# Patient Record
Sex: Female | Born: 1950
Health system: Southern US, Community
[De-identification: ages and names within clinical notes are randomized; demographics above are authoritative.]

## PROBLEM LIST (undated history)

## (undated) DIAGNOSIS — R112 Nausea with vomiting, unspecified: Secondary | ICD-10-CM

## (undated) DIAGNOSIS — F419 Anxiety disorder, unspecified: Secondary | ICD-10-CM

## (undated) DIAGNOSIS — I10 Essential (primary) hypertension: Secondary | ICD-10-CM

## (undated) DIAGNOSIS — E039 Hypothyroidism, unspecified: Secondary | ICD-10-CM

## (undated) DIAGNOSIS — F32A Depression, unspecified: Secondary | ICD-10-CM

## (undated) DIAGNOSIS — Z9889 Other specified postprocedural states: Secondary | ICD-10-CM

## (undated) DIAGNOSIS — F329 Major depressive disorder, single episode, unspecified: Secondary | ICD-10-CM

## (undated) HISTORY — DX: Essential (primary) hypertension: I10

## (undated) HISTORY — PX: TONSILLECTOMY: SUR1361

## (undated) HISTORY — DX: Depression, unspecified: F32.A

## (undated) HISTORY — PX: TUBAL LIGATION: SHX77

## (undated) HISTORY — DX: Anxiety disorder, unspecified: F41.9

## (undated) HISTORY — DX: Hypothyroidism, unspecified: E03.9

## (undated) HISTORY — DX: Major depressive disorder, single episode, unspecified: F32.9

---

## 1997-12-19 HISTORY — PX: BUNIONECTOMY: SHX129

## 2000-09-05 ENCOUNTER — Other Ambulatory Visit: Admission: RE | Admit: 2000-09-05 | Discharge: 2000-09-05 | Payer: Self-pay | Admitting: Gynecology

## 2000-09-28 ENCOUNTER — Emergency Department (HOSPITAL_COMMUNITY): Admission: EM | Admit: 2000-09-28 | Discharge: 2000-09-29 | Payer: Self-pay | Admitting: *Deleted

## 2000-10-20 ENCOUNTER — Encounter: Payer: Self-pay | Admitting: Family Medicine

## 2000-10-20 ENCOUNTER — Ambulatory Visit (HOSPITAL_COMMUNITY): Admission: RE | Admit: 2000-10-20 | Discharge: 2000-10-20 | Payer: Self-pay | Admitting: Family Medicine

## 2002-01-08 ENCOUNTER — Encounter: Admission: RE | Admit: 2002-01-08 | Discharge: 2002-01-08 | Payer: Self-pay | Admitting: Otolaryngology

## 2002-01-08 ENCOUNTER — Encounter: Payer: Self-pay | Admitting: Otolaryngology

## 2003-01-27 ENCOUNTER — Other Ambulatory Visit: Admission: RE | Admit: 2003-01-27 | Discharge: 2003-01-27 | Payer: Self-pay | Admitting: Obstetrics and Gynecology

## 2004-02-02 ENCOUNTER — Other Ambulatory Visit: Admission: RE | Admit: 2004-02-02 | Discharge: 2004-02-02 | Payer: Self-pay | Admitting: Gynecology

## 2004-11-08 ENCOUNTER — Ambulatory Visit: Payer: Self-pay | Admitting: Family Medicine

## 2005-02-03 ENCOUNTER — Other Ambulatory Visit: Admission: RE | Admit: 2005-02-03 | Discharge: 2005-02-03 | Payer: Self-pay | Admitting: Gynecology

## 2005-04-06 ENCOUNTER — Ambulatory Visit: Payer: Self-pay | Admitting: Internal Medicine

## 2005-07-20 ENCOUNTER — Ambulatory Visit: Payer: Self-pay | Admitting: Internal Medicine

## 2005-09-16 ENCOUNTER — Ambulatory Visit: Payer: Self-pay | Admitting: Internal Medicine

## 2005-10-05 ENCOUNTER — Ambulatory Visit: Payer: Self-pay | Admitting: Internal Medicine

## 2006-05-09 ENCOUNTER — Ambulatory Visit: Payer: Self-pay | Admitting: Internal Medicine

## 2006-05-12 ENCOUNTER — Ambulatory Visit: Payer: Self-pay | Admitting: Internal Medicine

## 2006-12-05 ENCOUNTER — Ambulatory Visit: Payer: Self-pay | Admitting: Internal Medicine

## 2007-01-30 ENCOUNTER — Ambulatory Visit: Payer: Self-pay | Admitting: Internal Medicine

## 2007-01-30 LAB — CONVERTED CEMR LAB
BUN: 12 mg/dL (ref 6–23)
CO2: 33 meq/L — ABNORMAL HIGH (ref 19–32)
Calcium: 9.8 mg/dL (ref 8.4–10.5)
Chloride: 103 meq/L (ref 96–112)
Creatinine, Ser: 0.8 mg/dL (ref 0.4–1.2)
GFR calc Af Amer: 96 mL/min
GFR calc non Af Amer: 79 mL/min
Glucose, Bld: 75 mg/dL (ref 70–99)
Potassium: 3.9 meq/L (ref 3.5–5.1)
Sodium: 144 meq/L (ref 135–145)
TSH: 2.26 microintl units/mL (ref 0.35–5.50)

## 2007-08-03 ENCOUNTER — Telehealth (INDEPENDENT_AMBULATORY_CARE_PROVIDER_SITE_OTHER): Payer: Self-pay | Admitting: *Deleted

## 2007-08-10 ENCOUNTER — Ambulatory Visit: Payer: Self-pay | Admitting: Internal Medicine

## 2007-08-10 DIAGNOSIS — E039 Hypothyroidism, unspecified: Secondary | ICD-10-CM | POA: Insufficient documentation

## 2007-08-10 DIAGNOSIS — Z87442 Personal history of urinary calculi: Secondary | ICD-10-CM | POA: Insufficient documentation

## 2007-08-10 DIAGNOSIS — G43009 Migraine without aura, not intractable, without status migrainosus: Secondary | ICD-10-CM | POA: Insufficient documentation

## 2007-08-10 DIAGNOSIS — F329 Major depressive disorder, single episode, unspecified: Secondary | ICD-10-CM | POA: Insufficient documentation

## 2007-08-10 DIAGNOSIS — I10 Essential (primary) hypertension: Secondary | ICD-10-CM | POA: Insufficient documentation

## 2007-08-10 DIAGNOSIS — J309 Allergic rhinitis, unspecified: Secondary | ICD-10-CM | POA: Insufficient documentation

## 2007-08-10 DIAGNOSIS — Z9189 Other specified personal risk factors, not elsewhere classified: Secondary | ICD-10-CM | POA: Insufficient documentation

## 2007-08-10 LAB — CONVERTED CEMR LAB
Bilirubin Urine: NEGATIVE
Glucose, Urine, Semiquant: NEGATIVE
Ketones, urine, test strip: NEGATIVE
Nitrite: NEGATIVE
Protein, U semiquant: NEGATIVE
Specific Gravity, Urine: 1.015
Urobilinogen, UA: NEGATIVE
WBC Urine, dipstick: NEGATIVE
pH: 6

## 2007-08-11 ENCOUNTER — Encounter: Payer: Self-pay | Admitting: Internal Medicine

## 2007-08-11 LAB — CONVERTED CEMR LAB
Bacteria, UA: NONE SEEN
RBC / HPF: NONE SEEN (ref <3)
WBC, UA: NONE SEEN cells/hpf (ref <3)

## 2007-08-14 LAB — CONVERTED CEMR LAB
BUN: 16 mg/dL (ref 6–23)
Basophils Absolute: 0.1 10*3/uL (ref 0.0–0.1)
Basophils Relative: 2 % — ABNORMAL HIGH (ref 0–1)
CO2: 27 meq/L (ref 19–32)
Calcium: 9.8 mg/dL (ref 8.4–10.5)
Chloride: 103 meq/L (ref 96–112)
Creatinine, Ser: 0.84 mg/dL (ref 0.40–1.20)
Eosinophils Absolute: 0.3 10*3/uL (ref 0.0–0.7)
Eosinophils Relative: 6 % — ABNORMAL HIGH (ref 0–5)
Glucose, Bld: 72 mg/dL (ref 70–99)
HCT: 40.6 % (ref 36.0–46.0)
Hemoglobin: 13.7 g/dL (ref 12.0–15.0)
Lymphocytes Relative: 43 % (ref 12–46)
Lymphs Abs: 1.9 10*3/uL (ref 0.7–3.3)
MCHC: 33.7 g/dL (ref 30.0–36.0)
MCV: 94 fL (ref 78.0–100.0)
Monocytes Absolute: 0.4 10*3/uL (ref 0.2–0.7)
Monocytes Relative: 10 % (ref 3–11)
Neutro Abs: 1.8 10*3/uL (ref 1.7–7.7)
Neutrophils Relative %: 40 % — ABNORMAL LOW (ref 43–77)
Platelets: 244 10*3/uL (ref 150–400)
Potassium: 4 meq/L (ref 3.5–5.3)
RBC: 4.32 M/uL (ref 3.87–5.11)
RDW: 13.3 % (ref 11.5–14.0)
Sodium: 142 meq/L (ref 135–145)
TSH: 2.879 microintl units/mL (ref 0.350–5.50)
WBC: 4.5 10*3/uL (ref 4.0–10.5)

## 2007-09-23 ENCOUNTER — Emergency Department (HOSPITAL_COMMUNITY): Admission: EM | Admit: 2007-09-23 | Discharge: 2007-09-24 | Payer: Self-pay | Admitting: Emergency Medicine

## 2007-09-28 ENCOUNTER — Encounter: Payer: Self-pay | Admitting: Family Medicine

## 2007-09-28 ENCOUNTER — Telehealth (INDEPENDENT_AMBULATORY_CARE_PROVIDER_SITE_OTHER): Payer: Self-pay | Admitting: *Deleted

## 2007-09-28 ENCOUNTER — Observation Stay (HOSPITAL_COMMUNITY): Admission: EM | Admit: 2007-09-28 | Discharge: 2007-09-30 | Payer: Self-pay | Admitting: Emergency Medicine

## 2007-09-28 ENCOUNTER — Ambulatory Visit: Payer: Self-pay | Admitting: Internal Medicine

## 2007-10-04 ENCOUNTER — Ambulatory Visit: Payer: Self-pay | Admitting: Internal Medicine

## 2007-10-05 LAB — CONVERTED CEMR LAB
Amylase: 221 units/L — ABNORMAL HIGH (ref 27–131)
BUN: 11 mg/dL (ref 6–23)
Basophils Absolute: 0 10*3/uL (ref 0.0–0.1)
Basophils Relative: 0.2 % (ref 0.0–1.0)
CO2: 30 meq/L (ref 19–32)
Calcium: 8.7 mg/dL (ref 8.4–10.5)
Chloride: 102 meq/L (ref 96–112)
Creatinine, Ser: 0.9 mg/dL (ref 0.4–1.2)
Eosinophils Absolute: 0.1 10*3/uL (ref 0.0–0.6)
Eosinophils Relative: 1.9 % (ref 0.0–5.0)
GFR calc Af Amer: 84 mL/min
GFR calc non Af Amer: 69 mL/min
Glucose, Bld: 100 mg/dL — ABNORMAL HIGH (ref 70–99)
HCT: 43.1 % (ref 36.0–46.0)
Hemoglobin: 15 g/dL (ref 12.0–15.0)
Lipase: 254 units/L — ABNORMAL HIGH (ref 11.0–59.0)
Lymphocytes Relative: 26.5 % (ref 12.0–46.0)
MCHC: 34.7 g/dL (ref 30.0–36.0)
MCV: 92.7 fL (ref 78.0–100.0)
Monocytes Absolute: 1.2 10*3/uL — ABNORMAL HIGH (ref 0.2–0.7)
Monocytes Relative: 17.1 % — ABNORMAL HIGH (ref 3.0–11.0)
Neutro Abs: 4 10*3/uL (ref 1.4–7.7)
Neutrophils Relative %: 54.3 % (ref 43.0–77.0)
Platelets: 408 10*3/uL — ABNORMAL HIGH (ref 150–400)
Potassium: 3.1 meq/L — ABNORMAL LOW (ref 3.5–5.1)
RBC: 4.65 M/uL (ref 3.87–5.11)
RDW: 11.9 % (ref 11.5–14.6)
Sodium: 139 meq/L (ref 135–145)
WBC: 7.2 10*3/uL (ref 4.5–10.5)

## 2007-10-09 ENCOUNTER — Ambulatory Visit: Payer: Self-pay | Admitting: Gastroenterology

## 2007-10-09 LAB — CONVERTED CEMR LAB
ALT: 21 units/L (ref 0–35)
AST: 22 units/L (ref 0–37)
Albumin: 3.1 g/dL — ABNORMAL LOW (ref 3.5–5.2)
Alkaline Phosphatase: 39 units/L (ref 39–117)
Amylase: 280 units/L — ABNORMAL HIGH (ref 27–131)
BUN: 5 mg/dL — ABNORMAL LOW (ref 6–23)
Basophils Absolute: 0.1 10*3/uL (ref 0.0–0.1)
Basophils Relative: 1.9 % — ABNORMAL HIGH (ref 0.0–1.0)
Bilirubin, Direct: 0.1 mg/dL (ref 0.0–0.3)
CO2: 29 meq/L (ref 19–32)
Calcium: 8.5 mg/dL (ref 8.4–10.5)
Chloride: 107 meq/L (ref 96–112)
Creatinine, Ser: 0.7 mg/dL (ref 0.4–1.2)
Eosinophils Absolute: 0.8 10*3/uL — ABNORMAL HIGH (ref 0.0–0.6)
Eosinophils Relative: 12.6 % — ABNORMAL HIGH (ref 0.0–5.0)
GFR calc Af Amer: 112 mL/min
GFR calc non Af Amer: 92 mL/min
Glucose, Bld: 95 mg/dL (ref 70–99)
HCT: 36.9 % (ref 36.0–46.0)
Hemoglobin: 12.8 g/dL (ref 12.0–15.0)
Lipase: 196 units/L — ABNORMAL HIGH (ref 11.0–59.0)
Lymphocytes Relative: 32.9 % (ref 12.0–46.0)
MCHC: 34.7 g/dL (ref 30.0–36.0)
MCV: 92.2 fL (ref 78.0–100.0)
Monocytes Absolute: 0.5 10*3/uL (ref 0.2–0.7)
Monocytes Relative: 8.9 % (ref 3.0–11.0)
Neutro Abs: 2.6 10*3/uL (ref 1.4–7.7)
Neutrophils Relative %: 43.7 % (ref 43.0–77.0)
Platelets: 453 10*3/uL — ABNORMAL HIGH (ref 150–400)
Potassium: 4.2 meq/L (ref 3.5–5.1)
RBC: 4 M/uL (ref 3.87–5.11)
RDW: 12.2 % (ref 11.5–14.6)
Sodium: 142 meq/L (ref 135–145)
Total Bilirubin: 0.5 mg/dL (ref 0.3–1.2)
Total Protein: 5.9 g/dL — ABNORMAL LOW (ref 6.0–8.3)
WBC: 6 10*3/uL (ref 4.5–10.5)

## 2007-10-15 ENCOUNTER — Encounter: Payer: Self-pay | Admitting: Internal Medicine

## 2007-10-18 ENCOUNTER — Telehealth: Payer: Self-pay | Admitting: Internal Medicine

## 2007-12-06 ENCOUNTER — Encounter: Payer: Self-pay | Admitting: Internal Medicine

## 2008-01-22 ENCOUNTER — Telehealth (INDEPENDENT_AMBULATORY_CARE_PROVIDER_SITE_OTHER): Payer: Self-pay | Admitting: *Deleted

## 2008-02-28 ENCOUNTER — Ambulatory Visit: Payer: Self-pay | Admitting: Internal Medicine

## 2008-03-03 ENCOUNTER — Telehealth (INDEPENDENT_AMBULATORY_CARE_PROVIDER_SITE_OTHER): Payer: Self-pay | Admitting: *Deleted

## 2008-03-03 LAB — CONVERTED CEMR LAB
ALT: 19 units/L (ref 0–35)
AST: 21 units/L (ref 0–37)
Albumin: 4.1 g/dL (ref 3.5–5.2)
Alkaline Phosphatase: 58 units/L (ref 39–117)
Amylase: 101 units/L (ref 27–131)
Basophils Absolute: 0.1 10*3/uL (ref 0.0–0.1)
Basophils Relative: 1.5 % — ABNORMAL HIGH (ref 0.0–1.0)
Bilirubin, Direct: 0.1 mg/dL (ref 0.0–0.3)
Eosinophils Absolute: 0.2 10*3/uL (ref 0.0–0.6)
Eosinophils Relative: 6.2 % — ABNORMAL HIGH (ref 0.0–5.0)
HCT: 40.9 % (ref 36.0–46.0)
Hemoglobin: 13.7 g/dL (ref 12.0–15.0)
Lipase: 34 units/L (ref 11.0–59.0)
Lymphocytes Relative: 44.8 % (ref 12.0–46.0)
MCHC: 33.4 g/dL (ref 30.0–36.0)
MCV: 92.9 fL (ref 78.0–100.0)
Monocytes Absolute: 0.4 10*3/uL (ref 0.2–0.7)
Monocytes Relative: 9.4 % (ref 3.0–11.0)
Neutro Abs: 1.5 10*3/uL (ref 1.4–7.7)
Neutrophils Relative %: 38.1 % — ABNORMAL LOW (ref 43.0–77.0)
Platelets: 235 10*3/uL (ref 150–400)
RBC: 4.4 M/uL (ref 3.87–5.11)
RDW: 12.5 % (ref 11.5–14.6)
TSH: 0.36 microintl units/mL (ref 0.35–5.50)
Total Bilirubin: 0.9 mg/dL (ref 0.3–1.2)
Total Protein: 6.6 g/dL (ref 6.0–8.3)
WBC: 4 10*3/uL — ABNORMAL LOW (ref 4.5–10.5)

## 2008-04-23 ENCOUNTER — Telehealth (INDEPENDENT_AMBULATORY_CARE_PROVIDER_SITE_OTHER): Payer: Self-pay | Admitting: *Deleted

## 2008-05-01 ENCOUNTER — Encounter (INDEPENDENT_AMBULATORY_CARE_PROVIDER_SITE_OTHER): Payer: Self-pay | Admitting: *Deleted

## 2008-06-05 ENCOUNTER — Telehealth (INDEPENDENT_AMBULATORY_CARE_PROVIDER_SITE_OTHER): Payer: Self-pay | Admitting: *Deleted

## 2008-08-28 ENCOUNTER — Ambulatory Visit: Payer: Self-pay | Admitting: Internal Medicine

## 2008-08-28 DIAGNOSIS — F411 Generalized anxiety disorder: Secondary | ICD-10-CM | POA: Insufficient documentation

## 2008-08-28 DIAGNOSIS — F419 Anxiety disorder, unspecified: Secondary | ICD-10-CM | POA: Insufficient documentation

## 2008-08-28 LAB — CONVERTED CEMR LAB
Bilirubin Urine: NEGATIVE
Glucose, Urine, Semiquant: NEGATIVE
Ketones, urine, test strip: NEGATIVE
Nitrite: NEGATIVE
Protein, U semiquant: NEGATIVE
RBC / HPF: NONE SEEN (ref <3)
Specific Gravity, Urine: 1.01
Squamous Epithelial / LPF: NONE SEEN /lpf
Urobilinogen, UA: 0.2
WBC Urine, dipstick: NEGATIVE
WBC, UA: NONE SEEN cells/hpf (ref <3)
pH: 7

## 2008-08-29 ENCOUNTER — Encounter: Payer: Self-pay | Admitting: Internal Medicine

## 2008-09-01 ENCOUNTER — Telehealth (INDEPENDENT_AMBULATORY_CARE_PROVIDER_SITE_OTHER): Payer: Self-pay | Admitting: *Deleted

## 2008-09-02 LAB — CONVERTED CEMR LAB
Amylase: 102 units/L (ref 27–131)
BUN: 17 mg/dL (ref 6–23)
CO2: 32 meq/L (ref 19–32)
Calcium: 9.6 mg/dL (ref 8.4–10.5)
Chloride: 107 meq/L (ref 96–112)
Cholesterol: 174 mg/dL (ref 0–200)
Creatinine, Ser: 0.8 mg/dL (ref 0.4–1.2)
GFR calc Af Amer: 96 mL/min
GFR calc non Af Amer: 79 mL/min
Glucose, Bld: 103 mg/dL — ABNORMAL HIGH (ref 70–99)
HDL: 57.7 mg/dL (ref >39.0)
LDL Cholesterol: 102 mg/dL — ABNORMAL HIGH (ref 0–99)
Lipase: 29 units/L (ref 11.0–59.0)
Potassium: 4.2 meq/L (ref 3.5–5.1)
Sodium: 145 meq/L (ref 135–145)
TSH: 0.14 microintl units/mL — ABNORMAL LOW (ref 0.35–5.50)
Total CHOL/HDL Ratio: 3
Triglycerides: 74 mg/dL (ref 0–149)
VLDL: 15 mg/dL (ref 0–40)

## 2008-09-22 ENCOUNTER — Ambulatory Visit: Payer: Self-pay | Admitting: Internal Medicine

## 2009-02-02 ENCOUNTER — Telehealth (INDEPENDENT_AMBULATORY_CARE_PROVIDER_SITE_OTHER): Payer: Self-pay | Admitting: *Deleted

## 2009-02-05 ENCOUNTER — Telehealth (INDEPENDENT_AMBULATORY_CARE_PROVIDER_SITE_OTHER): Payer: Self-pay | Admitting: *Deleted

## 2009-05-04 ENCOUNTER — Telehealth (INDEPENDENT_AMBULATORY_CARE_PROVIDER_SITE_OTHER): Payer: Self-pay | Admitting: *Deleted

## 2009-06-30 ENCOUNTER — Ambulatory Visit: Payer: Self-pay | Admitting: Internal Medicine

## 2009-06-30 ENCOUNTER — Telehealth (INDEPENDENT_AMBULATORY_CARE_PROVIDER_SITE_OTHER): Payer: Self-pay | Admitting: *Deleted

## 2009-07-03 LAB — CONVERTED CEMR LAB
BUN: 14 mg/dL (ref 6–23)
CO2: 30 meq/L (ref 19–32)
Calcium: 9.1 mg/dL (ref 8.4–10.5)
Chloride: 110 meq/L (ref 96–112)
Cholesterol: 206 mg/dL — ABNORMAL HIGH (ref 0–200)
Creatinine, Ser: 0.8 mg/dL (ref 0.4–1.2)
Direct LDL: 113.7 mg/dL
GFR calc non Af Amer: 78.65 mL/min (ref >60)
Glucose, Bld: 77 mg/dL (ref 70–99)
HCT: 38 % (ref 36.0–46.0)
HDL: 76.7 mg/dL (ref >39.00)
Hemoglobin: 13.1 g/dL (ref 12.0–15.0)
MCHC: 34.4 g/dL (ref 30.0–36.0)
MCV: 94.9 fL (ref 78.0–100.0)
Platelets: 198 10*3/uL (ref 150.0–400.0)
Potassium: 4.1 meq/L (ref 3.5–5.1)
RBC: 4 M/uL (ref 3.87–5.11)
RDW: 12.7 % (ref 11.5–14.6)
Sodium: 146 meq/L — ABNORMAL HIGH (ref 135–145)
TSH: 0.27 microintl units/mL — ABNORMAL LOW (ref 0.35–5.50)
Total CHOL/HDL Ratio: 3
Triglycerides: 61 mg/dL (ref 0.0–149.0)
VLDL: 12.2 mg/dL (ref 0.0–40.0)
WBC: 3.6 10*3/uL — ABNORMAL LOW (ref 4.5–10.5)

## 2009-10-31 ENCOUNTER — Ambulatory Visit: Payer: Self-pay | Admitting: Family Medicine

## 2009-10-31 LAB — CONVERTED CEMR LAB
Bilirubin Urine: NEGATIVE
Glucose, Urine, Semiquant: NEGATIVE
Ketones, urine, test strip: NEGATIVE
Nitrite: NEGATIVE
Protein, U semiquant: NEGATIVE
Specific Gravity, Urine: 1.015
Urobilinogen, UA: 0.2
pH: 6

## 2009-11-20 ENCOUNTER — Encounter (INDEPENDENT_AMBULATORY_CARE_PROVIDER_SITE_OTHER): Payer: Self-pay | Admitting: *Deleted

## 2010-01-26 ENCOUNTER — Ambulatory Visit: Payer: Self-pay | Admitting: Internal Medicine

## 2010-01-26 LAB — CONVERTED CEMR LAB
Basophils Absolute: 0 10*3/uL (ref 0.0–0.1)
Basophils Relative: 0.8 % (ref 0.0–3.0)
Eosinophils Absolute: 0.2 10*3/uL (ref 0.0–0.7)
Eosinophils Relative: 6.5 % — ABNORMAL HIGH (ref 0.0–5.0)
HCT: 39.1 % (ref 36.0–46.0)
Hemoglobin: 13.3 g/dL (ref 12.0–15.0)
Lymphocytes Relative: 52.6 % — ABNORMAL HIGH (ref 12.0–46.0)
Lymphs Abs: 1.7 10*3/uL (ref 0.7–4.0)
MCHC: 34 g/dL (ref 30.0–36.0)
MCV: 95.4 fL (ref 78.0–100.0)
Monocytes Absolute: 0.3 10*3/uL (ref 0.1–1.0)
Monocytes Relative: 10.4 % (ref 3.0–12.0)
Neutro Abs: 1 10*3/uL — ABNORMAL LOW (ref 1.4–7.7)
Neutrophils Relative %: 29.7 % — ABNORMAL LOW (ref 43.0–77.0)
Platelets: 195 10*3/uL (ref 150.0–400.0)
RBC: 4.1 M/uL (ref 3.87–5.11)
RDW: 12.2 % (ref 11.5–14.6)
WBC: 3.2 10*3/uL — ABNORMAL LOW (ref 4.5–10.5)

## 2010-01-29 LAB — CONVERTED CEMR LAB
BUN: 16 mg/dL (ref 6–23)
CO2: 32 meq/L (ref 19–32)
Calcium: 9.3 mg/dL (ref 8.4–10.5)
Chloride: 107 meq/L (ref 96–112)
Cholesterol: 234 mg/dL — ABNORMAL HIGH (ref 0–200)
Creatinine, Ser: 0.8 mg/dL (ref 0.4–1.2)
Direct LDL: 141.7 mg/dL
GFR calc non Af Amer: 78.49 mL/min (ref >60)
Glucose, Bld: 86 mg/dL (ref 70–99)
HDL: 81.3 mg/dL (ref >39.00)
Potassium: 3.5 meq/L (ref 3.5–5.1)
Sodium: 144 meq/L (ref 135–145)
TSH: 4.81 microintl units/mL (ref 0.35–5.50)
Total CHOL/HDL Ratio: 3
Triglycerides: 59 mg/dL (ref 0.0–149.0)
VLDL: 11.8 mg/dL (ref 0.0–40.0)

## 2010-02-02 ENCOUNTER — Telehealth: Payer: Self-pay | Admitting: Internal Medicine

## 2010-05-10 ENCOUNTER — Ambulatory Visit: Payer: Self-pay | Admitting: Internal Medicine

## 2010-05-10 DIAGNOSIS — L989 Disorder of the skin and subcutaneous tissue, unspecified: Secondary | ICD-10-CM | POA: Insufficient documentation

## 2010-08-17 ENCOUNTER — Telehealth: Payer: Self-pay | Admitting: Internal Medicine

## 2010-08-20 ENCOUNTER — Telehealth (INDEPENDENT_AMBULATORY_CARE_PROVIDER_SITE_OTHER): Payer: Self-pay | Admitting: *Deleted

## 2010-08-27 ENCOUNTER — Ambulatory Visit: Payer: Self-pay | Admitting: Internal Medicine

## 2010-08-27 DIAGNOSIS — R599 Enlarged lymph nodes, unspecified: Secondary | ICD-10-CM | POA: Insufficient documentation

## 2010-08-27 DIAGNOSIS — H1045 Other chronic allergic conjunctivitis: Secondary | ICD-10-CM | POA: Insufficient documentation

## 2010-08-27 DIAGNOSIS — N390 Urinary tract infection, site not specified: Secondary | ICD-10-CM | POA: Insufficient documentation

## 2010-08-27 LAB — CONVERTED CEMR LAB
Bilirubin Urine: NEGATIVE
Blood Glucose, Fingerstick: 91
Glucose, Urine, Semiquant: NEGATIVE
Ketones, urine, test strip: NEGATIVE
Nitrite: NEGATIVE
Protein, U semiquant: NEGATIVE
Specific Gravity, Urine: 1.02
Urobilinogen, UA: 0.2
WBC Urine, dipstick: NEGATIVE
pH: 7

## 2010-08-28 ENCOUNTER — Encounter: Payer: Self-pay | Admitting: Internal Medicine

## 2010-08-28 LAB — CONVERTED CEMR LAB
Bacteria, UA: NONE SEEN
Casts: NONE SEEN /lpf
Crystals: NONE SEEN
Squamous Epithelial / LPF: NONE SEEN /lpf

## 2010-09-01 LAB — CONVERTED CEMR LAB
Basophils Absolute: 0.1 10*3/uL (ref 0.0–0.1)
Basophils Relative: 1.8 % (ref 0.0–3.0)
Eosinophils Absolute: 0.2 10*3/uL (ref 0.0–0.7)
Eosinophils Relative: 6.7 % — ABNORMAL HIGH (ref 0.0–5.0)
HCT: 39.8 % (ref 36.0–46.0)
Hemoglobin: 13.8 g/dL (ref 12.0–15.0)
Lymphocytes Relative: 48.7 % — ABNORMAL HIGH (ref 12.0–46.0)
Lymphs Abs: 1.5 10*3/uL (ref 0.7–4.0)
MCHC: 34.7 g/dL (ref 30.0–36.0)
MCV: 94.6 fL (ref 78.0–100.0)
Monocytes Absolute: 0.3 10*3/uL (ref 0.1–1.0)
Monocytes Relative: 9.7 % (ref 3.0–12.0)
Neutro Abs: 1 10*3/uL — ABNORMAL LOW (ref 1.4–7.7)
Neutrophils Relative %: 33.1 % — ABNORMAL LOW (ref 43.0–77.0)
Platelets: 214 10*3/uL (ref 150.0–400.0)
RBC: 4.21 M/uL (ref 3.87–5.11)
RDW: 13.1 % (ref 11.5–14.6)
TSH: 0.84 microintl units/mL (ref 0.35–5.50)
WBC: 3.1 10*3/uL — ABNORMAL LOW (ref 4.5–10.5)

## 2010-09-06 ENCOUNTER — Encounter: Payer: Self-pay | Admitting: Internal Medicine

## 2010-09-09 ENCOUNTER — Encounter: Admission: RE | Admit: 2010-09-09 | Discharge: 2010-09-09 | Payer: Self-pay | Admitting: Otolaryngology

## 2010-09-17 ENCOUNTER — Telehealth: Payer: Self-pay | Admitting: Internal Medicine

## 2010-09-28 ENCOUNTER — Encounter: Payer: Self-pay | Admitting: Internal Medicine

## 2011-01-08 ENCOUNTER — Encounter: Payer: Self-pay | Admitting: Internal Medicine

## 2011-01-18 NOTE — Progress Notes (Signed)
Summary: dirrehea-dr paz  Phone Note Call from Patient Call back at [phone]   Caller: Patient Summary of Call: patient was at [facility] [phone] - she was told she had stomach flu & uti----she is still vomiting & has dirrehea wants to know if she can be seen or should she go back to er Initial call taken by: Okey Regal [place],  September 28, 2007 8:46 AM  Follow-up for Phone Call        spoke with pt who says went to [facility] 10/5 for n/v/d given medication iv  ,was given rx and sent home sx started again not able to keep med down pt said will go back to [facility] Follow-up by: Kandice Hams,  September 28, 2007 9:39 AM

## 2011-01-18 NOTE — Progress Notes (Signed)
Summary: PAZ---RX  Phone Note Refill Request   Refills Requested: Medication #1:  SYNTHROID 100 MCG  TABS one p.o. daily  Medication #2:  HYDROCHLOROTHIAZIDE 25 MG  TABS 1 by mouth qd [facility]--[phone]  Initial call taken by: Freddy Jaksch,  May 04, 2009 10:41 AM      Prescriptions: HYDROCHLOROTHIAZIDE 25 MG  TABS (HYDROCHLOROTHIAZIDE) 1 by mouth qd  #90 x 0   Entered by:   Kandice Hams   Authorized by:   Nolon Rod. Paz MD   Signed by:   Kandice Hams on 05/04/2009   Method used:   Faxed to ...       [facility]* (mail-order)             ,          Ph: [phone]       Fax: [phone]   RxID:   [id number] SYNTHROID 100 MCG  TABS (LEVOTHYROXINE SODIUM) one p.o. daily  #90 x 0   Entered by:   Kandice Hams   Authorized by:   Nolon Rod. Paz MD   Signed by:   Kandice Hams on 05/04/2009   Method used:   Faxed to ...       [facility]* (mail-order)             ,          Ph: [phone]       Fax: [phone]   RxID:   [id number]

## 2011-01-18 NOTE — Assessment & Plan Note (Signed)
Summary: med refill/cbs  Comments  - 90 DAY REFILL ON HCYT, SYNTHROID, CLONAZEPAM, & SYMPREX D Shary Decamp  June 30, 2009 11:35 AM    Prevention & Chronic Care Immunizations   Influenza vaccine: Not documented    Tetanus booster: Not documented    Pneumococcal vaccine: Not documented  Colorectal Screening   Hemoccult: Not documented    Colonoscopy: Not documented  Other Screening   Pap smear: Not documented    Mammogram: Not documented    Smoking status: never  (10/04/2007)  Hypertension   Last Blood Pressure: 170 / 94  (06/30/2009)   Serum creatinine: 0.8  (08/28/2008)   Serum potassium 4.2  (08/28/2008)  Lipids   Total Cholesterol: 174  (08/28/2008)   LDL: 102  (08/28/2008)   LDL Direct: Not documented   HDL: 57.7  (08/28/2008)   Triglycerides: 74  (08/28/2008)  Self-Management Support :    Hypertension self-management support: Not documented   History of Present Illness: ROV likes  "all blod work done" fasting   Current Medications (verified): 1)  Hydrochlorothiazide 25 Mg  Tabs (Hydrochlorothiazide) .Marland Kitchen.. 1 By Mouth Qd 2)  Synthroid 100 Mcg Tabs (Levothyroxine Sodium) .Marland Kitchen.. 1 A Day 3)  Carvedilol 12.5 Mg Tabs (Carvedilol) .Marland Kitchen.. 1 By Mouth Two Times A Day 4)  Prilosec Otc 20 Mg Tbec (Omeprazole Magnesium) .Marland Kitchen.. 1 A Day Before Breakfast 5)  Clonazepam 0.5 Mg Tabs (Clonazepam) .... 1/2 To 1 By Mouth Two Times A Day As Needed Anxiety 6)  Symbrex D  Allergies (verified): 1)  ! Codeine  Past History:  Past Medical History: Depression Hypertension Hypothyroidism Allergic rhinitis Anxiety  Past Surgical History: Reviewed history from 08/10/2007 and no changes required. Tubal ligation Tonsillectomy  Social History: Reviewed history from 08/10/2007 and no changes required. Married 1 son  Review of Systems       Depression, anxiety: ++ stress, will move to [place] tomorrow, unsure if it is fro few months or long term Hypertension, ambulatory  BPs up sometimes when checked, thinks related to stress  Hypothyroidism, good medication compliance  feels tired, has gain someweight    Physical Exam  General:  alert, well-developed, and well-nourished.   Neck:  no masses, no thyromegaly, and normal carotid upstroke.   Lungs:  normal respiratory effort, no intercostal retractions, no accessory muscle use, normal breath sounds, and no dullness.   Heart:  normal rate, regular rhythm, no murmur, and no gallop.   Abdomen:  soft, non-tender, normal bowel sounds, no distention, no masses, no guarding, and no rigidity.   Extremities:  no pretibial edema bilaterally  Neurologic:  alert & oriented X3, strength normal in all extremities, and gait normal.   Psych:  Cognition and judgment appear intact. Alert and cooperative with normal attention span and concentration not anxious appearing and not depressed appearing.     Vital Signs:  Patient profile:   60 year old female Weight:      141.4 pounds Pulse rate:   80 / minute Pulse rhythm:   regular BP sitting:   170 / 94  (left arm) Cuff size:   regular  Vitals Entered By: Shary Decamp (June 30, 2009 11:32 AM)   Impression & Recommendations:  Problem # 1:  HYPERTENSION (ICD-401.9) not at goal, increase carvedilol, see instructions  Her updated medication list for this problem includes:    Hydrochlorothiazide 25 Mg Tabs (Hydrochlorothiazide) .Marland Kitchen... 1 by mouth qd    Carvedilol 12.5 Mg Tabs (Carvedilol) .Marland Kitchen... 1 by mouth two times a  day  Orders: Venipuncture (04540) TLB-BMP (Basic Metabolic Panel-BMET) (80048-METABOL) TLB-Lipid Panel (80061-LIPID) TLB-CBC Platelet - w/Differential (85025-CBCD)  Problem # 2:  HYPOTHYROIDISM (ICD-244.9) labs  actual synthroid dose is Her updated medication list for this problem includes:    Synthroid 100 Mcg Tabs (Levothyroxine sodium) .Marland Kitchen... 1 a day  Orders: TLB-TSH (Thyroid Stimulating Hormone) (84443-TSH)  Problem # 3:  DEPRESSION  (ICD-311) counseled, moving out of states (long term?) RF  Her updated medication list for this problem includes:    Clonazepam 0.5 Mg Tabs (Clonazepam) .Marland Kitchen... 1/2 to 1 by mouth two times a day as needed anxiety  Problem # 4:  ANXIETY (ICD-300.00) counseled , see # 3 Her updated medication list for this problem includes:    Clonazepam 0.5 Mg Tabs (Clonazepam) .Marland Kitchen... 1/2 to 1 by mouth two times a day as needed anxiety  Complete Medication List: 1)  Hydrochlorothiazide 25 Mg Tabs (Hydrochlorothiazide) .Marland Kitchen.. 1 by mouth qd 2)  Synthroid 100 Mcg Tabs (Levothyroxine sodium) .Marland Kitchen.. 1 a day 3)  Carvedilol 12.5 Mg Tabs (Carvedilol) .Marland Kitchen.. 1 by mouth two times a day 4)  Prilosec Otc 20 Mg Tbec (Omeprazole magnesium) .Marland Kitchen.. 1 a day before breakfast 5)  Clonazepam 0.5 Mg Tabs (Clonazepam) .... 1/2 to 1 by mouth two times a day as needed anxiety 6)  Symprex D  .... Two times a day  Patient Instructions: 1)  please be sure we have a way to communicate with you since you are leaving town 2)  increase carvedilol 3)  check your BP with a doctor in 1 month 4)  Please schedule a follow-up appointment in 4 months (here or in [place]) Prescriptions: SYMPREX D two times a day  #180 x 1   Entered by:   Shary Decamp   Authorized by:   Nolon Rod. Volney Reierson MD   Signed by:   Shary Decamp on 06/30/2009   Method used:   Print then Give to Patient   RxID:   [id number] CLONAZEPAM 0.5 MG TABS (CLONAZEPAM) 1/2 to 1 by mouth two times a day as needed anxiety  #180 x 0   Entered by:   Shary Decamp   Authorized by:   Nolon Rod. Dejan Angert MD   Signed by:   Shary Decamp on 06/30/2009   Method used:   Print then Give to Patient   RxID:   [id number] CARVEDILOL 12.5 MG TABS (CARVEDILOL) 1 by mouth two times a day  #180 x 1   Entered by:   Shary Decamp   Authorized by:   Nolon Rod. Tajai Suder MD   Signed by:   Shary Decamp on 06/30/2009   Method used:   Print then Give to Patient   RxID:   [id number] SYNTHROID 100 MCG TABS  (LEVOTHYROXINE SODIUM) 1 a day  #90 x 1   Entered by:   Shary Decamp   Authorized by:   Nolon Rod. Elam Ellis MD   Signed by:   Shary Decamp on 06/30/2009   Method used:   Print then Give to Patient   RxID:   [id number] HYDROCHLOROTHIAZIDE 25 MG  TABS (HYDROCHLOROTHIAZIDE) 1 by mouth qd  #90 x 1   Entered by:   Shary Decamp   Authorized by:   Nolon Rod. Wash Nienhaus MD   Signed by:   Shary Decamp on 06/30/2009   Method used:   Print then Give to Patient   RxID:   [id number]    Vital Signs:  Patient Profile:   60 year old female  Weight:      141.4 pounds Pulse rate:   80 / minute Pulse rhythm:   regular BP sitting:   170 / 94 Cuff size:   regular  Vitals Entered By: Shary Decamp (June 30, 2009 11:33 AM)

## 2011-01-18 NOTE — Consult Note (Signed)
Summary: no neck  LAD---ENT  [facility]   Imported By: Lanelle Bal 09/14/2010 13:00:55  _____________________________________________________________________  External Attachment:    Type:   Image     Comment:   External Document

## 2011-01-18 NOTE — Assessment & Plan Note (Signed)
Summary: discuss thyroid,fatigued/alr   Vital Signs:  Patient Profile:   60 Years Old Female Weight:      144 pounds Pulse rate:   60 / minute Pulse rhythm:   regular BP sitting:   132 / 90  (left arm) Cuff size:   large  Vitals Entered By: Shary Decamp (August 10, 2007 3:14 PM)               Chief Complaint:  check thyroid; c/o urinary freq/dysuria x 1 week.  History of Present Illness: fatigued, tired, weight gain for several months. from time to time her blood pressure is in the low 100s and she feels dizzy when she stands up.. Also UTI symptoms for one week  Current Allergies (reviewed today): ! CODEINE Updated/Current Medications (including changes made in today's visit):  SINGULAIR 10 MG TABS (MONTELUKAST SODIUM) 1 by mouth qd WELLBUTRIN XL 150 MG  TB24 (BUPROPION HCL) 1 by mouth qd HYDROCHLOROTHIAZIDE 25 MG  TABS (HYDROCHLOROTHIAZIDE) 1 by mouth qd SYNTHROID 88 MCG  TABS (LEVOTHYROXINE SODIUM) 1 by mouth qd MACROBID 100 MG  CAPS (NITROFURANTOIN MONOHYD MACRO) one p.o. twice a day   Past Medical History:    Depression    Hypertension    Hypothyroidism    Allergic rhinitis  Past Surgical History:    Tubal ligation    Tonsillectomy   Social History:    Married    1 son    Review of Systems       denies fever, nausea . some lower abdominal discomfort. Denies hot flashes or mood swings. Depression is currently well controlled. No chest pain or shortness of breath.   Physical Exam  General:     alert and well-developed.   Eyes:     not pale Lungs:     normal respiratory effort, no intercostal retractions, and no accessory muscle use.   Heart:     normal rate, regular rhythm, and no murmur.   Abdomen:     soft and non-tender.  no CVA tenderness Neurologic:     alert & oriented X3.   Psych:     not anxious appearing and not depressed appearing.      Impression & Recommendations:  Problem # 1:  FATIGUE (ICD-780.79) agree that needs her  thyroid checked plus additional blood work Orders: Venipuncture (16109)   Problem # 2:  HYPERTENSION (ICD-401.9) overcontrol? see instructions Her updated medication list for this problem includes:    Hydrochlorothiazide 25 Mg Tabs (Hydrochlorothiazide) .Marland Kitchen... 1 by mouth qd  BP today: 132/90  Labs Reviewed: Creat: 0.8 (01/30/2007)  Orders: Venipuncture (60454)   Problem # 3:  HYPOTHYROIDISM (ICD-244.9)  Her updated medication list for this problem includes:    Synthroid 88 Mcg Tabs (Levothyroxine sodium) .Marland Kitchen... 1 by mouth qd  Labs Reviewed: TSH: 2.26 (01/30/2007)      Problem # 4:  U T I (ICD-599.0) symptoms consistent with a UTI.  Start  antibiotics Her updated medication list for this problem includes:    Macrobid 100 Mg Caps (Nitrofurantoin monohyd macro) ..... One p.o. twice a day   Complete Medication List: 1)  Singulair 10 Mg Tabs (Montelukast sodium) .Marland Kitchen.. 1 by mouth qd 2)  Wellbutrin Xl 150 Mg Tb24 (Bupropion hcl) .Marland Kitchen.. 1 by mouth qd 3)  Hydrochlorothiazide 25 Mg Tabs (Hydrochlorothiazide) .Marland Kitchen.. 1 by mouth qd 4)  Synthroid 88 Mcg Tabs (Levothyroxine sodium) .Marland Kitchen.. 1 by mouth qd 5)  Macrobid 100 Mg Caps (Nitrofurantoin monohyd macro) .... One p.o. twice  a day  Other Orders: UA Dipstick w/o Micro (99371) T-Culture, Urine (69678-93810) T-Urine Microscopic (17510-25852)   Patient Instructions: 1)  check your blood pressure from time to time, if it is consistently less than 100/60, drop the water pill to half a day.    Prescriptions: MACROBID 100 MG  CAPS (NITROFURANTOIN MONOHYD MACRO) one p.o. twice a day  #14 x 0   Entered and Authorized by:   Nolon Rod. Masey Scheiber MD   Signed by:   Nolon Rod. Ashby Leflore MD on 08/10/2007   Method used:   Print then Give to Patient   RxID:   [id number]    Laboratory Results   Urine Tests   Date/Time Reported: August 10, 2007 3:20 PM   Routine Urinalysis   Color: yellow Appearance: Clear Glucose: negative   (Normal Range:  Negative) Bilirubin: negative   (Normal Range: Negative) Ketone: negative   (Normal Range: Negative) Spec. Gravity: 1.015   (Normal Range: 1.003-1.035) Blood: moderate   (Normal Range: Negative) pH: 6.0   (Normal Range: 5.0-8.0) Protein: negative   (Normal Range: Negative) Urobilinogen: negative   (Normal Range: 0-1) Nitrite: negative   (Normal Range: Negative) Leukocyte Esterace: negative   (Normal Range: Negative)    Comments: sent for cx ..................................................................Marland KitchenShary Decamp  August 10, 2007 3:20 PM

## 2011-01-18 NOTE — Progress Notes (Signed)
Summary: appt--made for 9/13  Phone Note Outgoing Call   Call placed by: Army Fossa CMA,  August 20, 2010 8:03 AM Summary of Call: Please call pt and pt schedule an ROV. Army Fossa CMA  August 20, 2010 8:03 AM   Follow-up for Phone Call        patient has appt on 9/13 Follow-up by: Jerolyn Shin,  August 25, 2010 4:05 PM

## 2011-01-18 NOTE — Assessment & Plan Note (Signed)
Summary: 27months--tl   Vital Signs:  Patient Profile:   60 Years Old Female Weight:      139.2 pounds Pulse rate:   84 / minute BP sitting:   146 / 100  Vitals Entered By: Shary Decamp (August 28, 2008 8:39 AM)                 Chief Complaint:  rov - fasting; bp has been elevated @ home - pt states she has a lot of "stress" right now; pt states she has been having some dysuria.  History of Present Illness: rov - fasting;  --bp has been elevated @ home - 140s/100 -- pt states she has a lot of "stress" right now, moving out of state? --pt states she has been having some dysuria -- llikes amylase lipase recheck    Prior Medication List:  HYDROCHLOROTHIAZIDE 25 MG  TABS (HYDROCHLOROTHIAZIDE) 1 by mouth qd SYNTHROID 100 MCG  TABS (LEVOTHYROXINE SODIUM) one p.o. daily   Current Allergies (reviewed today): ! CODEINE  Past Medical History:    Reviewed history from 08/10/2007 and no changes required:       Depression       Hypertension       Hypothyroidism       Allergic rhinitis       Anxiety   Social History:    Reviewed history from 08/10/2007 and no changes required:       Married       1 son    Review of Systems  General      Denies fever.  GI      Denies bloody stools.      occ sharp pain in the upper abd. still occ nausea,  ; she did not do the endoscopic u/s c/o" indigestion", ?heartburn "you can taste what you ate"  GU      Denies hematuria.      no vag d/c   Psych      Denies depression.      sleeps well has never use any anxiety medicine, thinks needs something to help her on the most stressful days   Physical Exam  General:     alert and well-developed.   Neck:     no masses.   Lungs:     normal respiratory effort, no intercostal retractions, no accessory muscle use, and normal breath sounds.   Heart:     normal rate, regular rhythm, and no murmur.   Abdomen:     soft,  no hepatomegaly, and no splenomegaly.  Slt tender w/o  mass at epigastric area Extremities:     no pretibial edema bilaterally  Psych:     Oriented X3, memory intact for recent and remote, good eye contact, and not depressed appearing.  Slt anxious    Impression & Recommendations:  Problem # 1:  UTI (ICD-599.0) Assessment: New cipro, call if no better Her updated medication list for this problem includes:    Cipro 500 Mg Tabs (Ciprofloxacin hcl) .Marland Kitchen... 1 by mouth two times a day   Problem # 2:  ANXIETY (ICD-300.00) Assessment: New has some anxiety, no depression at this time trial w/ Klonopin as needed-- s/e somnolence Her updated medication list for this problem includes:    Clonazepam 0.5 Mg Tabs (Clonazepam) .Marland Kitchen... 1/2 to 1 by mouth two times a day as needed anxiety   Problem # 3:  HYPOTHYROIDISM (ICD-244.9)  Her updated medication list for this problem includes:    Synthroid 100 Mcg  Tabs (Levothyroxine sodium) ..... One p.o. daily  Orders: TLB-TSH (Thyroid Stimulating Hormone) (84443-TSH)   Problem # 4:  HYPERTENSION (ICD-401.9) not at goal add coreg Her updated medication list for this problem includes:    Hydrochlorothiazide 25 Mg Tabs (Hydrochlorothiazide) .Marland Kitchen... 1 by mouth qd    Carvedilol 6.25 Mg Tabs (Carvedilol) .Marland Kitchen... 1 by mouth two times a day  Orders: TLB-BMP (Basic Metabolic Panel-BMET) (80048-METABOL) TLB-Lipid Panel (80061-LIPID)  BP today: 146/100 Prior BP: 136/80 (02/28/2008)  Labs Reviewed: Creat: 0.7 (10/09/2007)   Problem # 5:  NAUSEA (ICD-787.02) see previous note pt likes her blood check; I told that even if those numbers are normal  she needs to be reasses by GI explained that normal results doesn't mean that everything is ok Orders: Venipuncture (16109) TLB-Amylase (82150-AMYL) TLB-Lipase (83690-LIPASE)   Problem # 6:  level 4 OV  Complete Medication List: 1)  Hydrochlorothiazide 25 Mg Tabs (Hydrochlorothiazide) .Marland Kitchen.. 1 by mouth qd 2)  Synthroid 100 Mcg Tabs (Levothyroxine sodium)  .... One p.o. daily 3)  Carvedilol 6.25 Mg Tabs (Carvedilol) .Marland Kitchen.. 1 by mouth two times a day 4)  Cipro 500 Mg Tabs (Ciprofloxacin hcl) .Marland Kitchen.. 1 by mouth two times a day 5)  Prilosec Otc 20 Mg Tbec (Omeprazole magnesium) .Marland Kitchen.. 1 a day before breakfast 6)  Clonazepam 0.5 Mg Tabs (Clonazepam) .... 1/2 to 1 by mouth two times a day as needed anxiety  Other Orders: T-Culture, Urine ([id number]) T-Urine Microscopic ([id number]) UA Dipstick w/o Micro (manual) (13086)   Patient Instructions: 1)  Please schedule a follow-up appointment in 1 month.   Prescriptions: CLONAZEPAM 0.5 MG TABS (CLONAZEPAM) 1/2 to 1 by mouth two times a day as needed anxiety  #40 x 0   Entered and Authorized by:   Nolon Rod. Jamari Diana MD   Signed by:   Nolon Rod. Tasheba Henson MD on 08/28/2008   Method used:   Print then Give to Patient   RxID:   [id number] CIPRO 500 MG TABS (CIPROFLOXACIN HCL) 1 by mouth two times a day  #10 x 0   Entered and Authorized by:   Nolon Rod. Wlliam Grosso MD   Signed by:   Nolon Rod. Alba Perillo MD on 08/28/2008   Method used:   Print then Give to Patient   RxID:   [id number] CARVEDILOL 6.25 MG TABS (CARVEDILOL) 1 by mouth two times a day  #60 x 3   Entered and Authorized by:   Nolon Rod. Emira Eubanks MD   Signed by:   Nolon Rod. Tymeshia Awan MD on 08/28/2008   Method used:   Print then Give to Patient   RxID:   [id number]  ] Laboratory Results   Urine Tests    Routine Urinalysis   Glucose: negative   (Normal Range: Negative) Bilirubin: negative   (Normal Range: Negative) Ketone: negative   (Normal Range: Negative) Spec. Gravity: 1.010   (Normal Range: 1.003-1.035) Blood: moderate   (Normal Range: Negative) pH: 7.0   (Normal Range: 5.0-8.0) Protein: negative   (Normal Range: Negative) Urobilinogen: 0.2   (Normal Range: 0-1) Nitrite: negative   (Normal Range: Negative) Leukocyte Esterace: negative   (Normal Range: Negative)

## 2011-01-18 NOTE — Assessment & Plan Note (Signed)
Summary: sick stomache- cbs   Vital Signs:  Patient Profile:   60 Years Old Female Weight:      143 pounds Temp:     98.4 degrees F oral BP sitting:   136 / 80  Vitals Entered By: Shary Decamp (February 28, 2008 11:29 AM)                 Chief Complaint:  still c/o of nausea; hair falling out x 3-4 weeks.  History of Present Illness: still c/o of nausea; worse w/  heavy meals  hair falling out x 3-4 weeks--likes TSH check    Updated Prior Medication List: HYDROCHLOROTHIAZIDE 25 MG  TABS (HYDROCHLOROTHIAZIDE) 1 by mouth qd SYNTHROID 100 MCG  TABS (LEVOTHYROXINE SODIUM) one p.o. daily  Current Allergies (reviewed today): ! CODEINE  Past Medical History:    Reviewed history from 08/10/2007 and no changes required:       Depression       Hypertension       Hypothyroidism       Allergic rhinitis     Review of Systems  General      Denies chills and fever.      appetite normal  GI      Denies bloody stools, diarrhea, and vomiting.  Neuro      occ HAs   Physical Exam  General:     alert and well-developed.   Eyes:     not pale or icteric Lungs:     normal respiratory effort, no intercostal retractions, no accessory muscle use, and normal breath sounds.   Heart:     normal rate, regular rhythm, and no murmur.   Abdomen:     soft, non-tender, no hepatomegaly, and no splenomegaly.      Impression & Recommendations:  Problem # 1:  NAUSEA (ICD-787.02) CHART, Dr Gerilyn Pilgrim and Dr Matthias Hughs notes reviewd: was admitted to the hospital October 2008 CT of the abdomen: increase mesentery lymph nodes size, nl GB? U/S showed thikened GB and sludge GI was consulted she was noted to have a moderate increase in amylase// lipase but  the GI team felt that clinically she did not have pancreatitis. Saw Dr Armond Hang and Buccini as out pt: both rec. Endocopic U/S Labs from 12-08:amylase-lipase-CA 19 9: normal per pt request will recheck labs She never had a HIDA scan--will  order one-------------pt likes to wait on this Needs to proceed w/ End. U/S, she will contact GI  Orders: TLB-Hepatic/Liver Function Pnl (80076-HEPATIC) TLB-CBC Platelet - w/Differential (85025-CBCD) TLB-Amylase (82150-AMYL) TLB-Lipase (83690-LIPASE)   Problem # 2:  HYPOTHYROIDISM (ICD-244.9)  Her updated medication list for this problem includes:    Synthroid 100 Mcg Tabs (Levothyroxine sodium) ..... One p.o. daily  Labs Reviewed: TSH: 2.879 (08/10/2007)     Orders: TLB-TSH (Thyroid Stimulating Hormone) (84443-TSH) Venipuncture (16109)   Complete Medication List: 1)  Hydrochlorothiazide 25 Mg Tabs (Hydrochlorothiazide) .Marland Kitchen.. 1 by mouth qd 2)  Synthroid 100 Mcg Tabs (Levothyroxine sodium) .... One p.o. daily   Patient Instructions: 1)  Please schedule a follow-up appointment in 3 months.    ]  Appended Document: sick stomache- cbs faxed to dr Christella Hartigan

## 2011-01-18 NOTE — Assessment & Plan Note (Signed)
Summary: HOSPITAL FOLLOW UP FOR PANCREATITUS/ALJ   Vital Signs:  Patient Profile:   60 Years Old Female Weight:      133.8 pounds Temp:     97.6 degrees F oral Pulse rate:   96 / minute BP sitting:   110 / 80  Vitals Entered By: Shary Decamp (October 04, 2007 9:40 AM)                 Chief Complaint:  hosp f/u - still weak; some nausea; HCTZ was held in hosp - pt has not taken any meds x 2 weeks.  History of Present Illness: here for follow-up of a hospital admission having nausea vomiting and diarrhea for two weeks was admitted to the hospital from Oct. 10 to October 12 CT of the abdomen: increase mesentery lymph nodes size, nl GB?, abnormal left breast implant also noted U/S showed thikened GB and sludge GI was consulted she was noted to have a moderate increase in amylase// lipase but  the GI team felt that clinically she did not have pancreatitis. She also had hypokalemia prior to discharge her last hemoglobin was 12.9, last potassium 3.3, amylase 179.  Lipase was 100.  LFTs were normal.  At this point she still feels very weak overall has improved some.  denies any recent new medications, foreign trips, unusual foods.  she stopped all her regular medications for the last two weeks including Singulair, Wellbutrin, HCTZ and Synthroid  Current Allergies (reviewed today): ! CODEINE   Family History:    colon ca-- no    breast ca--no    pancreas ca-- F   Risk Factors:  Tobacco use:  never Alcohol use:  yes    Comments:  veri little   Review of Systems       still has nausea no fever mild, crampy, on-and-off abdominal pain that decrease with burping Diarrhea has finally stopped in the last couple of days she has been able to keep some fluids down.    Physical Exam  General:     alert.  seems weak but not acutely ill Mouth:     slight dry mouth Lungs:     normal respiratory effort, no intercostal retractions, and no accessory muscle use.   Heart:      normal rate, regular rhythm, and no murmur.   Abdomen:     soft.  mild tenderness at the mid abdomen, no mass.  Palpable aorta. Normal bowel sounds.  No mass or rebound Extremities:     no edema she    Impression & Recommendations:  Problem # 1:  two-week history of nausea vomiting and diarrhea she is not completely well this still could be acute gastroenteritis recheck labs   Problem # 2:  paperwork for her insurance completed  Problem # 3:  face-to-face in excess of 25 minutes due to chart review  Problem # 4:  NAUSEA (ICD-787.02) as above Orders: TLB-BMP (Basic Metabolic Panel-BMET) (80048-METABOL) TLB-CBC Platelet - w/Differential (85025-CBCD) TLB-Lipase (83690-LIPASE) TLB-Amylase (82150-AMYL) Venipuncture ([address])   Problem # 5:  DIARRHEA (ICD-787.91) as above Orders: TLB-BMP (Basic Metabolic Panel-BMET) (80048-METABOL) TLB-CBC Platelet - w/Differential (85025-CBCD) TLB-Lipase (83690-LIPASE) TLB-Amylase (82150-AMYL) Venipuncture (60454)   Problem # 6:  HYPOTHYROIDISM (ICD-244.9) re start synthroid Her updated medication list for this problem includes:    Synthroid 100 Mcg Tabs (Levothyroxine sodium) ..... One p.o. daily   Problem # 7:  CT showed an abnormal left breast implant: d/w pt on RTC  Complete Medication List: 1)  Wellbutrin Xl 150 Mg Tb24 (Bupropion hcl) .Marland Kitchen.. 1 by mouth qd 2)  Hydrochlorothiazide 25 Mg Tabs (Hydrochlorothiazide) .Marland Kitchen.. 1 by mouth qd 3)  Synthroid 100 Mcg Tabs (Levothyroxine sodium) .... One p.o. daily   Patient Instructions: 1)  rest 2)  fluids 3)  if your symptoms get worse, you have high fever, increased pain, more diarrhea or nausea : let us know 4)  Please schedule a follow-up appointment in 2 weeks.    ]

## 2011-01-18 NOTE — Progress Notes (Signed)
Summary: Sandra Owen--rx CLONAZEPAM  Phone Note Refill Request   Refills Requested: Medication #1:  CLONAZEPAM 0.5 MG TABS 1/2 to 1 by mouth two times a day as needed anxiety [facility] [address][phone]--Last filled--12.29.09  Initial call taken by: Freddy Jaksch,  February 05, 2009 9:23 AM  Follow-up for Phone Call        LAST OV 09/22/08, LAST REFILL #60 X 1 ON 09/22/08 Kandice Hams  February 05, 2009 11:43 AM   Follow-up by: Kandice Hams,  February 05, 2009 11:43 AM  Additional Follow-up for Phone Call Additional follow up Details #1::        ok 60 and 3RFs Jose E. Paz MD  February 05, 2009 5:40 PM       Prescriptions: CLONAZEPAM 0.5 MG TABS (CLONAZEPAM) 1/2 to 1 by mouth two times a day as needed anxiety  #60 x 3   Entered by:   Kandice Hams   Authorized by:   Nolon Rod. Paz MD   Signed by:   Kandice Hams on 02/06/2009   Method used:   Printed then faxed to ...       [facility] [phone]* (retail)       [address]       [place], [place]  [address]       Ph: [phone]       Fax: [phone]   RxID:   [id number]

## 2011-01-18 NOTE — Assessment & Plan Note (Signed)
Summary: ROV ///sph   Vital Signs:  Patient profile:   60 year old female Weight:      134.50 pounds Temp:     97.6 degrees F oral BP sitting:   122 / 80  (left arm) Cuff size:   regular  Vitals Entered By: Army Fossa CMA (August 27, 2010 9:41 AM) CC: f/u visit, fasting. CBG Result 91 Comments Having sinus conegstion x 2 weeks. Urinating more frequently. [facility] [address]   History of Present Illness: routine office visit  2 weeks history of eye congestion and discharge, left more than right. The eyelids on the left side are slightly swollen. Zyrtec seems to help the symptoms, she used a  left over patanol  for few days. Using her decongestant daily. she wears  contacts  Urinating more frequently lately  L sided neck LADs "still there" and sore   ROS Denies fevers + RN mild, minimal nasal d/c , mild eye itching. Some sneezing for the last week Denies dysuria or gross hematuria Admits to increased thirst and appetite lately. No weight loss       Current Medications (verified): 1)  Hydrochlorothiazide 25 Mg  Tabs (Hydrochlorothiazide) .Marland Kitchen.. 1 By Mouth Daily. 2)  Synthroid 100 Mcg Tabs (Levothyroxine Sodium) .Marland Kitchen.. 1 By Mouth Once Daily - Due Office Visit 8/11 3)  Prilosec Otc 20 Mg Tbec (Omeprazole Magnesium) .Marland Kitchen.. 1 A Day Before Breakfast 4)  Clonazepam 0.5 Mg Tabs (Clonazepam) .... 1/2 To 1 By Mouth Two Times A Day As Needed Anxiety 5)  Semprex D 8/60 .... Two Times A Day, Do Not Use More Than 3x/week 6)  Flonase 50 Mcg/act Susp (Fluticasone Propionate) .... 2 Sprays in Each Side of The Nose Daily  Allergies: 1)  ! Codeine  Past History:  Past Medical History: Reviewed history from 01/26/2010 and no changes required. Depression Hypertension Hypothyroidism Allergic rhinitis Anxiety  Past Surgical History: Reviewed history from 08/10/2007 and no changes required. Tubal ligation Tonsillectomy  Social History: Reviewed history from 08/10/2007  and no changes required. Married 1 son  Physical Exam  General:  alert and well-developed.   Eyes:  EOMI Anterior chambers normal Conjunctiva slightly red, no discharge appreciated left eyelids slightly swollen Neck:  1  enlarged lymph node approximately 1.5 cm in length on the left neck, no fluctuant, slightly tender. Lungs:  normal respiratory effort, no intercostal retractions, no accessory muscle use, and normal breath sounds.   Heart:  normal rate, regular rhythm, and no murmur.   Abdomen:  soft, non-tender, no distention, and no masses.   Neurologic:  no motor deficits   Impression & Recommendations:  Problem # 1:  ALLERGIC RHINITIS (ICD-477.9) see history of present illness, the size allergic rhinitis she likely has allergic conjunctivitis Plan: Do not use contacts until better refer to ophthalmology RX Alocril Zyrtec  avoid decongestant  The following medications were removed from the medication list:    Astepro 0.15 % Soln (Azelastine hcl) .Marland Kitchen... 2 sprays on each side of the nose possibly Her updated medication list for this problem includes:    Flonase 50 Mcg/act Susp (Fluticasone propionate) .Marland Kitchen... 2 sprays in each side of the nose daily  Problem # 2:  SKIN LESION (ICD-709.9) was referred to dermatology, biopsy dong, she was told she has s  "precancerous lesion", followup in 3 months with dermatology counseled aboy sun exposure   Problem # 3:  UTI (ICD-599.0) Assessment: New urine culture pending Rx cipro   Her updated medication list  for this problem includes:    Ciprofloxacin Hcl 500 Mg Tabs (Ciprofloxacin hcl) ..... One by mouth twice a day for 3 days. avoid excessive sun exposure  Problem # 4:  HYPOTHYROIDISM (ICD-244.9) alabs Her updated medication list for this problem includes:    Synthroid 100 Mcg Tabs (Levothyroxine sodium) .Marland Kitchen... 1 by mouth once daily - due office visit 8/11    Labs Reviewed: TSH: 4.81 (01/26/2010)    Chol: 234 (01/26/2010)    HDL: 81.30 (01/26/2010)   LDL: 102 (08/28/2008)   TG: 59.0 (01/26/2010)  Orders: Venipuncture (78469) TLB-TSH (Thyroid Stimulating Hormone) (84443-TSH) Specimen Handling (62952)  Problem # 5:  HYPERTENSION (ICD-401.9) BP well controlled WBC slightly low lately. Recheck Her updated medication list for this problem includes:    Hydrochlorothiazide 25 Mg Tabs (Hydrochlorothiazide) .Marland Kitchen... 1 by mouth daily.    BP today: 122/80 Prior BP: 124/70 (05/10/2010)  Labs Reviewed: K+: 3.5 (01/26/2010) Creat: : 0.8 (01/26/2010)   Chol: 234 (01/26/2010)   HDL: 81.30 (01/26/2010)   LDL: 102 (08/28/2008)   TG: 59.0 (01/26/2010)  Orders: TLB-CBC Platelet - w/Differential (85025-CBCD)  Problem # 6:  ALLERGIC CONJUNCTIVITIS (ICD-372.14) see #1 Orders: Ophthalmology Referral (Ophthalmology)  Problem # 7:  LYMPHADENOPATHY (ICD-785.6)  Persistent neck lymphadenopathy, see physical exam. Refer to ENT   Orders: ENT Referral (ENT)  Orders: ENT Referral (ENT)  Problem # 8:  addendum -face to face more than 25 minutes due to the number of issues addressed and counseling regards her allergies -screen for diabetes (-)  -declined the flu shot, benefits discussed  Problem # 9:     Complete Medication List: 1)  Hydrochlorothiazide 25 Mg Tabs (Hydrochlorothiazide) .Marland Kitchen.. 1 by mouth daily. 2)  Synthroid 100 Mcg Tabs (Levothyroxine sodium) .Marland Kitchen.. 1 by mouth once daily - due office visit 8/11 3)  Prilosec Otc 20 Mg Tbec (Omeprazole magnesium) .Marland Kitchen.. 1 a day before breakfast 4)  Clonazepam 0.5 Mg Tabs (Clonazepam) .... 1/2 to 1 by mouth two times a day as needed anxiety 5)  Semprex D 8/60  .... Two times a day, do not use more than 3x/week 6)  Flonase 50 Mcg/act Susp (Fluticasone propionate) .... 2 sprays in each side of the nose daily 7)  Alocril 2 % Soln (Nedocromil sodium) .... 2 drops on each eye twice a day 8)  Ciprofloxacin Hcl 500 Mg Tabs (Ciprofloxacin hcl) .... One by mouth twice a day for 3 days.  avoid excessive sun exposure  Other Orders: T-Urine Microscopic (84132-44010) T-Culture, Urine (27253-66440) UA Dipstick w/o Micro (automated)  (81003)  Patient Instructions: 1)  no contact lenses 2)  Will refer you to an eye doctor 3)  Use the eyedrops as prescribed and cold compresses 4)  Take the antibiotic for a UTI for 3 days 5)  Zyrtec daily 6)  avoid the decongestants 7)  follow up by February for your  physical, fasting Prescriptions: CIPROFLOXACIN HCL 500 MG TABS (CIPROFLOXACIN HCL) one by mouth twice a day for 3 days. Avoid excessive sun exposure  #6 x 0   Entered and Authorized by:   Nolon Rod. Angeliah Wisdom MD   Signed by:   Nolon Rod. Jazzmen Restivo MD on 08/27/2010   Method used:   [facility] to        [facility] [phone]* (retail)       [address]       [place], [place]  [address]       Ph: [phone]  Fax: [phone]   RxID:   [id number] ALOCRIL 2 % SOLN (NEDOCROMIL SODIUM) 2 drops on each eye twice a day  #1 x 1   Entered and Authorized by:   Elita Quick E. Corianna Avallone MD   Signed by:   Nolon Rod. Bathsheba Durrett MD on 08/27/2010   Method used:   Electronically to        [facility] [phone]* (retail)       [address]       [place], [place]  [address]       Ph: [phone]       Fax: [phone]   RxID:   [id number]   Laboratory Results   Urine Tests    Routine Urinalysis   Color: yellow Appearance: Clear Glucose: negative   (Normal Range: Negative) Bilirubin: negative   (Normal Range: Negative) Ketone: negative   (Normal Range: Negative) Spec. Gravity: 1.020   (Normal Range: 1.003-1.035) Blood: moderate   (Normal Range: Negative) pH: 7.0   (Normal Range: 5.0-8.0) Protein: negative   (Normal Range: Negative) Urobilinogen: 0.2   (Normal Range: 0-1) Nitrite: negative   (Normal Range: Negative) Leukocyte Esterace: negative   (Normal Range: Negative)    Comments: Army Fossa CMA  August 27, 2010 9:52  AM   Blood Tests     CBG Random:: 91mg /dL

## 2011-01-18 NOTE — Progress Notes (Signed)
Summary: Spine specialist  Phone Note Call from Patient Call back at Home Phone [phone]   Summary of Call: Patient left message on triage that she has seen Dr. Pollyann Kennedy and he suggests that she see a spine specialist for surgery. Patient would like to know if there is someone in the [place] group that you recommend or a particular group you recommend? Otherwise she will be referred to [facility]. Please advise. Initial call taken by: Lucious Groves CMA,  September 17, 2010 11:34 AM  Follow-up for Phone Call        [facility]  is fine Follow-up by: [facility] E. Torrie Namba MD,  September 17, 2010 2:59 PM  Additional Follow-up for Phone Call Additional follow up Details #1::        left message on voicemail to call back to office. Lucious Groves CMA  September 17, 2010 3:53 PM   Patient notified. Lucious Groves CMA  September 20, 2010 2:26 PM

## 2011-01-18 NOTE — Progress Notes (Signed)
Summary: refill semprex d  Phone Note Refill Request Message from:  [facility]  Refills Requested: Medication #1:  SYMPREX D two times a day.   Notes: 8/60, combo of acrivastine, psudoephedrine, we refilled @ ov 06/2009 Initial call taken by: Shary Decamp,  February 02, 2010 2:29 PM  Follow-up for Phone Call        reason it is in a supplementation of antihistaminic H-1 bloker and pseudoephedrine I don't recommend chronic use self pseudoephedrine Plan: recommend a trial with Claritin or Zyrtec once a day get pseudoephedrine 30 mg behind the counter and take one or two p.r.n. only Follow-up by: Damiah Mcdonald E. Skilynn Durney MD,  February 03, 2010 12:52 PM  Additional Follow-up for Phone Call Additional follow up Details #1::        Patient states that she only takes it 2-3 x/wk as needed....  is that ok or would you still prefer claritin, sudafed 30mg ? Shary Decamp  February 03, 2010 4:51 PM ok if she uses it < 3/week Braylei Totino E. Darly Massi MD  February 04, 2010 8:20 AM  discussed with pt Shary Decamp  February 04, 2010 9:44 AM     New/Updated Medications: Faustino Congress D 8/60 two times a day, do not use more than 3x/week Prescriptions: SYMPREX D 8/60 two times a day, do not use more than 3x/week  #180 x 0   Entered by:   Shary Decamp   Authorized by:   Nolon Rod. Baelynn Schmuhl MD   Signed by:   Shary Decamp on 02/04/2010   Method used:   Faxed to ...       [facility]* (mail-order)             ,          Ph: [phone]       Fax: [phone]   RxID:   [id number]

## 2011-01-18 NOTE — Progress Notes (Signed)
Summary: refill  Phone Note Refill Request Call back at Home Phone [phone] Message from:  Patient  Refills Requested: Medication #1:  CLONAZEPAM 0.5 MG TABS 1/2 to 1 by mouth two times a day as needed anxiety   Last Refilled: 11/20/2009 Pt uses [facility]. last ov- 05/10/10 (acute)  Initial call taken by: Army Fossa CMA,  August 17, 2010 1:50 PM Caller: Patient  Follow-up for Phone Call        ok RF 3 months, due for [facility] E. Murry Diaz MD  August 17, 2010 1:56 PM     Prescriptions: CLONAZEPAM 0.5 MG TABS (CLONAZEPAM) 1/2 to 1 by mouth two times a day as needed anxiety  #180 x 0   Entered by:   Army Fossa CMA   Authorized by:   Nolon Rod. Jarl Sellitto MD   Signed by:   Army Fossa CMA on 08/17/2010   Method used:   Printed then faxed to ...       [facility]* (retail)             ,          Ph: [phone]       Fax: [phone]   RxID:   [id number]

## 2011-01-18 NOTE — Progress Notes (Signed)
Summary: Paz--shingles  Phone Note Call from Patient Call back at [phone]   Caller: Patient Summary of Call: Pt is calling wanting to be seen today because she think she has shingles and want to get it treated quickly. Have spot broken out on her body for a week now and want them look at. Initial call taken by: Freddy Jaksch,  June 05, 2008 10:08 AM  Follow-up for Phone Call        Spoke with pt who c/o spot on armpit and on side shingles? offered appt today at [facility] office pt refused only wants to see Dr Norberta Keens sched for tomorrow.Kandice Hams  June 05, 2008 10:21 AM  Follow-up by: Kandice Hams,  June 05, 2008 10:21 AM

## 2011-01-18 NOTE — Letter (Signed)
Summary: [employer]  [employer]   Imported By: Freddy Jaksch 10/17/2007 11:07:36  _____________________________________________________________________  External Attachment:    Type:   Image     Comment:   External Document

## 2011-01-18 NOTE — Letter (Signed)
Summary: Primary Care Appointment Letter  [facility] [place]  [address] [place], [place] [address]   Phone: [phone]  Fax: [phone]    05/01/2008 MRN: [id number]  [facility] [address] [place], [place]  [address]  Dear Ms. Salce,   Your Primary Care Physician  has indicated that:    ___x____it is time to schedule an appointment.    _______you missed your appointment on______ and need to call and          reschedule.    _______you need to have lab work done.    _______you need to schedule an appointment discuss lab or test results.    _______you need to call to reschedule your appointment that is                       scheduled on _________.     Please call our office as soon as possible. Our phone number is [phone]          _________. Please press option 1. Our office is open 8a-12noon and 1p-5p, Monday through Friday.     Thank you,    [facility] Primary [facility] Scheduler

## 2011-01-18 NOTE — Progress Notes (Signed)
Summary: MED REFILLS; ALSO REQST TO [facility] DR/dr Aidenjames Heckmann see  Phone Note Call from Patient Call back at [phone]   Caller: Patient Reason for Call: Talk to Nurse Summary of Call: DR Pasco Marchitto NURSE  CAME IN TODAY WITH TWO ISSUES:  MED REFILL AND REQUEST TO CHANGE GASTROENTEROLOGY DOCTORS    (1)  REFILL    *******************90 DAY SUPPLY *********************** FOR BOTH OF FOLLOWING:  SYNTHROID 0.88   SEMPREX-D   ( SHE CANT REMEMBER STRENGTH)  (2)  SHE CURRENTLY IS SEEING DR JABOBS AT [facility] AND ACTUALLY HAS AN APPOINTMENT WITH HIM ON 11/10--WANTS TO CHANGE TO DR Molly Maduro BUCCINI AT [facility] BECAUSE HER HUSBAND SEES HIM AND SHE REALLY LIKES HIM--CAN SHE BE CHANGED (REFERRED) TO SEE DR BUCCINI INSTEAD FOR AN APPT ON OR AROUND 11/10??  WANTS RECORDS FOR LAST TWO HOSPITAL VISITS SENT TO HIM---DO WE HAVE RECORDS AND SHE NEEDS TO SIGN MED RELEASE WITH US--OR DOES SHE SIGN MED RELEASE AT DR BUCCINI'S OFFICE?? Initial call taken by: Jerolyn Shin,  October 18, 2007 11:20 AM  Follow-up for Phone Call        [facility] TO CALL Follow-up by: Kandice Hams,  October 18, 2007 4:47 PM  Additional Follow-up for Phone Call Additional follow up Details #1::        pt called back asked pt re semprex d who precribed pt said dr Ruthine Dose did she has been on for years off and on for at least 15 years only med that helps with her allergies pt also requesting referral to dr buccini her husband goes to him and she said feel comfortably with him  Additional Follow-up by: Kandice Hams,  October 18, 2007 4:59 PM    Additional Follow-up for Phone Call Additional follow up Details #2::    ok synthroid 1 by mouth once daily 90 and 1  rf Semprex D 1 poBID as needed allergies #180 no RF OK referal to Dr. Matthias Hughs if so desire: forward all labs anv OV notes....................................................................Kasey Hansell E. Jenniefer Salak MD  October 19, 2007 3:52 PM pt informed friday oct 31 rx printed pt  will pick up on monday 11/3 referral in place , referral coordinator to set pt informed...................................................................Marland KitchenKandice Hams  October 22, 2007 10:15 AM  Follow-up by: Kandice Hams,  October 22, 2007 10:15 AM  New/Updated Medications: SEMPREX-D 8-60 MG  CAPS (ACRIVASTINE-PSEUDOEPHEDRINE) 1 by mouth BID   Prescriptions: SYNTHROID 100 MCG  TABS (LEVOTHYROXINE SODIUM) one p.o. daily  #90 x 1   Entered by:   Kandice Hams   Authorized by:   Nolon Rod. Marquel Spoto MD   Signed by:   Kandice Hams on 10/19/2007   Method used:   Print then Give to Patient   RxID:   [id number] SEMPREX-D 8-60 MG  CAPS (ACRIVASTINE-PSEUDOEPHEDRINE) 1 by mouth BID  #180 x 0   Entered by:   Kandice Hams   Authorized by:   Nolon Rod. Alahni Varone MD   Signed by:   Kandice Hams on 10/19/2007   Method used:   Print then Give to Patient   RxID:   [id number]

## 2011-01-18 NOTE — Progress Notes (Signed)
Summary: FOR LAB=06/30/09, CALL CELL NUMBER WITH RESULTS  Phone Note Call from Patient Call back at CELL = [phone]   Caller: Patient Summary of Call: WHEN LAB RESULTS FROM 06/30/09 COME BACK, PLEASE CALL CELL PHONE [phone] Initial call taken by: Jerolyn Shin,  June 30, 2009 12:13 PM  Follow-up for Phone Call        discussed with pt Shary Decamp  July 03, 2009 11:59 AM

## 2011-01-18 NOTE — Assessment & Plan Note (Signed)
Summary: fu on meds/kdc   Vital Signs:  Patient profile:   60 year old female Height:      64 inches Weight:      136.4 pounds BMI:     23.50 Pulse rate:   68 / minute BP sitting:   140 / 90  (left arm)  Vitals Entered By: Doristine Devoid (January 26, 2010 9:07 AM) CC: f/u on meds  Comments -hasn't taken carvedilol in 2-3 months    History of Present Illness: likes chol check, CBC     Allergies: 1)  ! Codeine  Past History:  Past Medical History: Depression Hypertension Hypothyroidism Allergic rhinitis Anxiety  Past Surgical History: Reviewed history from 08/10/2007 and no changes required. Tubal ligation Tonsillectomy  Social History: Reviewed history from 08/10/2007 and no changes required. Married 1 son  Review of Systems       Depression, anxiety-- overall  better , on clonazepam as needed, as some point she though  they  would have to leave [place] but apparently they are staying   Hypertension-- self d/c carvedilol due to tiredness, ambulatory BPs 118/80, occasionally SBP  goes up to 140, DBP usually < 90 Hypothyroidism-- needs labs     Physical Exam  General:  alert, well-developed, and well-nourished.   Lungs:  normal respiratory effort, no intercostal retractions, no accessory muscle use, normal breath sounds, and no dullness.   Heart:  normal rate, regular rhythm, no murmur, and no gallop.   Psych:  not anxious appearing and not depressed appearing.     Impression & Recommendations:  Problem # 1:  ANXIETY (ICD-300.00) stable at present Her updated medication list for this problem includes:    Clonazepam 0.5 Mg Tabs (Clonazepam) .Marland Kitchen... 1/2 to 1 by mouth two times a day as needed anxiety  Problem # 2:  HYPOTHYROIDISM (ICD-244.9)  labs, good compliance with medicine Her updated medication list for this problem includes:    Synthroid 88 Mcg Tabs (Levothyroxine sodium) .Marland Kitchen... 1 by mouth once daily    Labs Reviewed: TSH: 0.27 (06/30/2009)      Chol: 206 (06/30/2009)   HDL: 76.70 (06/30/2009)   LDL: 102 (08/28/2008)   TG: 61.0 (06/30/2009)  Orders: Venipuncture ([address]) TLB-TSH (Thyroid Stimulating Hormone) (84443-TSH)  Problem # 3:  HYPERTENSION (ICD-401.9)  patient self discontinued carvedilol due to tiredness amb BPs are well controlled for the most part likes to recheck her cholesterol see instructions The following medications were removed from the medication list:    Carvedilol 12.5 Mg Tabs (Carvedilol) .Marland Kitchen... 1 by mouth two times a day Her updated medication list for this problem includes:    Hydrochlorothiazide 25 Mg Tabs (Hydrochlorothiazide) .Marland Kitchen... 1 by mouth qd    BP today: 140/90 Prior BP: 130/90 (10/31/2009)  Labs Reviewed: K+: 4.1 (06/30/2009) Creat: : 0.8 (06/30/2009)   Chol: 206 (06/30/2009)   HDL: 76.70 (06/30/2009)   LDL: 102 (08/28/2008)   TG: 61.0 (06/30/2009)  Orders: TLB-Lipid Panel (80061-LIPID) TLB-CBC Platelet - w/Differential (85025-CBCD)  Complete Medication List: 1)  Hydrochlorothiazide 25 Mg Tabs (Hydrochlorothiazide) .Marland Kitchen.. 1 by mouth qd 2)  Synthroid 88 Mcg Tabs (Levothyroxine sodium) .Marland Kitchen.. 1 by mouth once daily 3)  Prilosec Otc 20 Mg Tbec (Omeprazole magnesium) .Marland Kitchen.. 1 a day before breakfast 4)  Clonazepam 0.5 Mg Tabs (Clonazepam) .... 1/2 to 1 by mouth two times a day as needed anxiety 5)  Symprex D  .... Two times a day  Patient Instructions: 1)  Check your blood pressure   3 to  4 times a week. If it is more than 140/85 consistently,please let us know   2)  Please schedule a follow-up appointment in 6 months .

## 2011-01-18 NOTE — Assessment & Plan Note (Signed)
Summary: ?UTI- jr   Vital Signs:  Patient profile:   60 year old female Weight:      136 pounds Temp:     98.6 degrees F oral Pulse rate:   72 / minute Pulse rhythm:   regular BP sitting:   130 / 90  (left arm) Cuff size:   regular  Vitals Entered By: Mervin Hack CMA Duncan Dull) (October 31, 2009 11:20 AM)   History of Present Illness: a 60 year old female, who presents with urinary tract infection symptoms, dusuria, urgency. Feeling of incomplete voiding of the bladder. she has had symptoms now for several days. She is afebrile. She is eating and drinking normally.  Sinus symptoms: Additionally, the patient has had some URI symptoms over the last week, now she has some sinus pressure and pain. She also has some tooth and upper mandible pain as well as ear fullness. She denies any significant throat pain, no cough symptoms. No diarrhea. No rashes.  REVIEW OF SYSTEMS GEN: The details of the acute illness are noted above.  CV: No chest pain or SOB GI: No noted N or V Otherwise, pertinent positives and negatives are noted in the HPI.   GEN: WDWN, NAD; alert,appropriate and cooperative throughout exam HEENT: Normocephalic and atraumatic. Throat clear, w/o exudate, no LAD, R TM clear, L TM - good landmarks, No fluid present. rhinnorhea.  Left frontal and maxillary sinuses: NT Right frontal and maxillary sinuses: mildly tender NECK: No ant or post LAD CV: RRR, No M/G/R PULM: no resp distress, no accessory muscles.  No retractions. no w/c/r ABD: S,NT,ND,+BS, No HSM, suprapubic tenderness Some CVAT EXTR: no c/c/e PSYCH: full affect, pleasant, conversant   Allergies: 1)  ! Codeine  Past History:  Past medical, surgical, family and social histories (including risk factors) reviewed, and no changes noted (except as noted below).  Past Medical History: Reviewed history from 06/30/2009 and no changes required. Depression Hypertension Hypothyroidism Allergic rhinitis Anxiety   Past Surgical History: Reviewed history from 08/10/2007 and no changes required. Tubal ligation Tonsillectomy  Family History: Reviewed history from 10/04/2007 and no changes required. colon ca-- no breast ca--no pancreas ca-- F  Social History: Reviewed history from 08/10/2007 and no changes required. Married 1 son   Impression & Recommendations:  Problem # 1:  UTI (ICD-599.0) Assessment New  back pain, early pyelo uti signs  treat with 10 d LVQ  Her updated medication list for this problem includes:    Levaquin 500 Mg Tabs (Levofloxacin) .Marland Kitchen... 1 by mouth daily  Orders: UA Dipstick w/o Micro (manual) ([address])  Problem # 2:  SINUSITIS - ACUTE-NOS (ICD-461.9) Assessment: New may all be uri, but will use abx that would cover sinusitis as well  Her updated medication list for this problem includes:    Levaquin 500 Mg Tabs (Levofloxacin) .Marland Kitchen... 1 by mouth daily  Complete Medication List: 1)  Hydrochlorothiazide 25 Mg Tabs (Hydrochlorothiazide) .Marland Kitchen.. 1 by mouth qd 2)  Synthroid 88 Mcg Tabs (Levothyroxine sodium) .Marland Kitchen.. 1 by mouth once daily 3)  Carvedilol 12.5 Mg Tabs (Carvedilol) .Marland Kitchen.. 1 by mouth two times a day 4)  Prilosec Otc 20 Mg Tbec (Omeprazole magnesium) .Marland Kitchen.. 1 a day before breakfast 5)  Clonazepam 0.5 Mg Tabs (Clonazepam) .... 1/2 to 1 by mouth two times a day as needed anxiety 6)  Symprex D  .... Two times a day 7)  Levaquin 500 Mg Tabs (Levofloxacin) .Marland Kitchen.. 1 by mouth daily Prescriptions: LEVAQUIN 500 MG TABS (LEVOFLOXACIN) 1 by mouth daily  #  10 x 0   Entered and Authorized by:   Hannah Beat MD   Signed by:   Hannah Beat MD on 10/31/2009   Method used:   [facility]  [id number]       [address]       [place], [place]  [address]       Ph: [phone] or [phone]       Fax: [phone]   RxID:   [id number]   Current Allergies (reviewed today): ! CODEINE  Laboratory Results   Urine  Tests  Date/Time Received: October 31, 2009 11:27 AM Date/Time Reported: October 31, 2009 11:27 AM  Routine Urinalysis   Color: lt. yellow Appearance: Hazy Glucose: negative   (Normal Range: Negative) Bilirubin: negative   (Normal Range: Negative) Ketone: negative   (Normal Range: Negative) Spec. Gravity: 1.015   (Normal Range: 1.003-1.035) Blood: small   (Normal Range: Negative) pH: 6.0   (Normal Range: 5.0-8.0) Protein: negative   (Normal Range: Negative) Urobilinogen: 0.2   (Normal Range: 0-1) Nitrite: negative   (Normal Range: Negative) Leukocyte Esterace: trace   (Normal Range: Negative)

## 2011-01-18 NOTE — Progress Notes (Signed)
Summary: lab results  Phone Note Call from Patient Call back at Home Phone [phone]   Caller: Patient Reason for Call: Lab or Test Results Summary of Call: dr. Drue Novel patient would like to know the results of 3.16.09  Initial call taken by: Charolette Child,  March 03, 2008 3:53 PM  Follow-up for Phone Call        patient aware labs normal and also copy was mailed to patient ..................................................................Marland KitchenDoristine Devoid  March 03, 2008 4:32 PM

## 2011-01-18 NOTE — Assessment & Plan Note (Signed)
Summary: allergies//lch   Vital Signs:  Patient profile:   60 year old female Height:      64 inches Weight:      134 pounds Temp:     99 degrees F BP sitting:   124 / 70  Vitals Entered By: Shary Decamp (May 10, 2010 2:47 PM) CC: allergy sxs x 4 weeks, no relief with OTC meds, c/o of pain on left side of neck x several day   History of Present Illness: 4 weeks history of watery and itchy eyes, postnasal dripping, sneezing, throat itching. Also she feels some glands on the left side of her neck, they are hurting. Also has a spot in the nose that is red and is not healing for a while  Current Medications (verified): 1)  Hydrochlorothiazide 25 Mg  Tabs (Hydrochlorothiazide) .... Per Pt Not Taking X 4-5 Months -- ...Marland KitchenMarland KitchenMarland KitchenShary Decamp  May 10, 2010 2:48 Pm 2)  Synthroid 100 Mcg Tabs (Levothyroxine Sodium) .Marland Kitchen.. 1 By Mouth Once Daily - Due Office Visit 8/11 3)  Prilosec Otc 20 Mg Tbec (Omeprazole Magnesium) .Marland Kitchen.. 1 A Day Before Breakfast 4)  Clonazepam 0.5 Mg Tabs (Clonazepam) .... 1/2 To 1 By Mouth Two Times A Day As Needed Anxiety 5)  Symprex D 8/60 .... Two Times A Day, Do Not Use More Than 3x/week  Allergies (verified): 1)  ! Codeine  Past History:  Past Medical History: Reviewed history from 01/26/2010 and no changes required. Depression Hypertension Hypothyroidism Allergic rhinitis Anxiety  Past Surgical History: Reviewed history from 08/10/2007 and no changes required. Tubal ligation Tonsillectomy  Review of Systems       mild fever on and off for a few weeks? Ears feel stopped up No cough or chest congestion She had some green nasal discharge on and off for weeks, here lately is only clear discharge  Physical Exam  General:  alert, well-developed, and well-nourished.   Head:  face symmetric, slightly tender in the maxillary and frontal sinus area Eyes:  no redness or discharge Ears:  L ear normal.   right ear with hard wax Nose:  she has a 2 mm red and  slightly scaly spot Mouth:  no redness or discharge Neck:  2 slightly enlarged lymph nodes on the left neck, no fluctuant, slightly tender. Lungs:  normal respiratory effort, no intercostal retractions, no accessory muscle use, and normal breath sounds.   Heart:  normal rate, regular rhythm, and no murmur.     Impression & Recommendations:  Problem # 1:  ALLERGIC RHINITIS (ICD-477.9) symptoms consistent with allergic rhinitis, also some evidence of left maxillary sinusitis plan: Depo-Medrol Will ask her to hold semper D (which has an antihistaminic and it already) see instructions if the left-sided neck lymphadenopathies are not better in few days, she is to let me know  Her updated medication list for this problem includes:    Flonase 50 Mcg/act Susp (Fluticasone propionate) .Marland Kitchen... 2 sprays in each side of the nose daily    Astepro 0.15 % Soln (Azelastine hcl) .Marland Kitchen... 2 sprays on each side of the nose possibly  Problem # 2:  SKIN LESION (ICD-709.9)  nasal skin lesion, cancer? Referral to dermatology  Orders: Dermatology Referral (Derma)  Complete Medication List: 1)  Hydrochlorothiazide 25 Mg Tabs (Hydrochlorothiazide) .... Per pt not taking x 4-5 months -- ...Marland KitchenMarland KitchenMarland Kitchenstacia hawks  May 10, 2010 2:48 pm 2)  Synthroid 100 Mcg Tabs (Levothyroxine sodium) .Marland Kitchen.. 1 by mouth once daily - due office visit 8/11 3)  Prilosec Otc 20 Mg Tbec (Omeprazole magnesium) .Marland Kitchen.. 1 a day before breakfast 4)  Clonazepam 0.5 Mg Tabs (Clonazepam) .... 1/2 to 1 by mouth two times a day as needed anxiety 5)  Semprex D 8/60  .... Two times a day, do not use more than 3x/week 6)  Flonase 50 Mcg/act Susp (Fluticasone propionate) .... 2 sprays in each side of the nose daily 7)  Astepro 0.15 % Soln (Azelastine hcl) .... 2 sprays on each side of the nose possibly 8)  Amoxicillin 500 Mg Caps (Amoxicillin) .... 2 tablets twice a day for 10 days  Other Orders: Depo- Medrol 80mg  (J1040) Admin of Therapeutic Inj   intramuscular or subcutaneous (62703)  Patient Instructions: 1)  Zyrtec, no more than 10 mg daily 2)  Flonase, nasal spray twice a day 3)  astepro nasal spray twice a day 4)  Hold semper D  5)  Amoxicillin for 10 days 6)  Call if not better in a couple of weeks Prescriptions: AMOXICILLIN 500 MG CAPS (AMOXICILLIN) 2 tablets twice a day for 10 days  #40 x 0   Entered and Authorized by:   Nolon Rod. Paulyne Mooty MD   Signed by:   Nolon Rod. Rutha Melgoza MD on 05/10/2010   Method used:   [facility]        [phone]       [address]       [place], [place]  [address]       Ph: [phone]       Fax: [phone]   RxID:   [id number] ASTEPRO 0.15 % SOLN (AZELASTINE HCL) 2 sprays on each side of the nose possibly  #1 x 6   Entered and Authorized by:   Nolon Rod. Selyna Klahn MD   Signed by:   Nolon Rod. Rojean Ige MD on 05/10/2010   Method used:   [facility] to        [facility] [phone][facility] (retail)       [address]       [place], [place]  [address]       Ph: [phone]       Fax: [phone]   RxID:   [id number] FLONASE 50 MCG/ACT SUSP (FLUTICASONE PROPIONATE) 2 sprays in each side of the nose daily  #1 x 6   Entered and Authorized by:   Nolon Rod. McMullin Carmack MD   Signed by:   Nolon Rod. Jaeleigh Monaco MD on 05/10/2010   Method used:   [facility] to        [facility] [phone][facility] (retail)       [address]       [place], [place]  [address]       Ph: [phone]       Fax: [phone]   RxID:   [id number]    Medication Administration  Injection # 1:    Medication: Depo- Medrol 80mg     Diagnosis: ACUTE SINUSITIS, UNSPECIFIED (ICD-461.9)    Route: IM    Site: R deltoid    Exp Date: 10/2010    Lot #: [id number]    Patient tolerated injection without complications    Given by: Shary Decamp (May 10, 2010 3:23 PM)  Orders Added: 1)  Depo- Medrol 80mg  [J1040] 2)  Admin of Therapeutic Inj  intramuscular or  subcutaneous [96[address]] 3)  Dermatology Referral [  Derma] 4)  Est. Patient Level III [29[address]]

## 2011-01-21 ENCOUNTER — Telehealth (INDEPENDENT_AMBULATORY_CARE_PROVIDER_SITE_OTHER): Payer: Self-pay | Admitting: *Deleted

## 2011-01-21 NOTE — Letter (Signed)
Summary: [facility]  [facility]   Imported By: Lanelle Bal 10/18/2010 10:20:56  _____________________________________________________________________  External Attachment:    Type:   Image     Comment:   External Document

## 2011-02-03 ENCOUNTER — Encounter (INDEPENDENT_AMBULATORY_CARE_PROVIDER_SITE_OTHER): Payer: Self-pay | Admitting: *Deleted

## 2011-02-09 NOTE — Letter (Signed)
Summary: Primary Care Appointment Letter  [facility]  [address] [place], [place] [address]   Phone: [phone]  Fax: [phone]    02/03/2011 MRN: [id number]  [facility] [address] [place], [place]  [address]  Dear [name],   Your Primary Care Physician [place] E. Paz MD has indicated that:    Sandra Owen   It is time to schedule an appointment for a Physical with Fasting labs.    _______you missed your appointment on______ and need to call and          reschedule.    _______you need to have lab work done.    _______you need to schedule an appointment discuss lab or test results.    _______you need to call to reschedule your appointment that is                       scheduled on _________.     Please call our office as soon as possible. Our phone number is [phone]. Our office is open 8a-12noon and 1p-5p, Monday through Friday.     Thank you,    [facility] Scheduler

## 2011-03-16 ENCOUNTER — Encounter: Payer: Self-pay | Admitting: Internal Medicine

## 2011-03-16 ENCOUNTER — Ambulatory Visit (INDEPENDENT_AMBULATORY_CARE_PROVIDER_SITE_OTHER): Payer: 59 | Admitting: Internal Medicine

## 2011-03-16 DIAGNOSIS — F411 Generalized anxiety disorder: Secondary | ICD-10-CM

## 2011-03-16 DIAGNOSIS — R599 Enlarged lymph nodes, unspecified: Secondary | ICD-10-CM

## 2011-03-16 DIAGNOSIS — M1990 Unspecified osteoarthritis, unspecified site: Secondary | ICD-10-CM

## 2011-03-16 DIAGNOSIS — J309 Allergic rhinitis, unspecified: Secondary | ICD-10-CM

## 2011-03-16 DIAGNOSIS — I10 Essential (primary) hypertension: Secondary | ICD-10-CM

## 2011-03-16 DIAGNOSIS — E039 Hypothyroidism, unspecified: Secondary | ICD-10-CM

## 2011-03-16 LAB — BASIC METABOLIC PANEL
BUN: 17 mg/dL (ref 6–23)
CO2: 32 mEq/L (ref 19–32)
Calcium: 9.6 mg/dL (ref 8.4–10.5)
Chloride: 105 mEq/L (ref 96–112)
Creatinine, Ser: 0.7 mg/dL (ref 0.4–1.2)
GFR: 89.72 mL/min (ref >60.00)
Glucose, Bld: 81 mg/dL (ref 70–99)
Potassium: 4.1 mEq/L (ref 3.5–5.1)
Sodium: 142 mEq/L (ref 135–145)

## 2011-03-16 LAB — TSH: TSH: 0.97 u[IU]/mL (ref 0.35–5.50)

## 2011-03-16 MED ORDER — AMOXICILLIN 500 MG PO CAPS: 1000.0000 mg | ORAL_CAPSULE | Freq: Two times a day (BID) | ORAL | Status: AC

## 2011-03-16 MED ORDER — HYDROCODONE-ACETAMINOPHEN 5-500 MG PO TABS: ORAL_TABLET | ORAL | Status: DC

## 2011-03-16 MED ORDER — CYCLOBENZAPRINE HCL 10 MG PO TABS: 10.0000 mg | ORAL_TABLET | Freq: Three times a day (TID) | ORAL | Status: DC | PRN

## 2011-03-16 NOTE — Assessment & Plan Note (Signed)
Not well controlled, see history of present illness. On exam she has some tenderness in the left maxillary area. Will add Zyrtec antibiotics, see instructions

## 2011-03-16 NOTE — Assessment & Plan Note (Signed)
Omproved, self d/c klonazepam

## 2011-03-16 NOTE — Assessment & Plan Note (Signed)
Blood pressure well controlled, labs

## 2011-03-16 NOTE — Assessment & Plan Note (Signed)
Labs

## 2011-03-16 NOTE — Assessment & Plan Note (Signed)
See physical exam, area of concern was examined by ENT 08-2010, he referred pt  to neurosurgery. Neurosurgery felt that she has neck DJD. Plan observation

## 2011-03-16 NOTE — Assessment & Plan Note (Addendum)
Neck  DJD dx by neurosurgery last year, on Flexeril, request a prescription for a low dose of hydrocodone. I agree.

## 2011-03-16 NOTE — Progress Notes (Signed)
  Subjective:    Patient ID: Sandra Owen, female    DOB: 1951/05/06, 60 y.o.   MRN: [id number]  HPI RF meds  HTN  ambulatory BPs usually ~ 120-130 Depression--symptoms improved, she self discontinued clonazepam Sinusitis? Sx started about a month ago, sinus congestion at night, postnasal dripping, taking Mucinex which initially helped. Allergies--Taking nasal steroids and eye drops as needed only, not taken daily. Also, was diagnosed with DJD in the neck, Dr Phoebe Perch prescribed Flexeril which helps, also she tried very small dose of hydrocodone from her husband and that worked even better. Request a prescription for hydrocodone and refill on Flexeril    Review of Systems  No fever or chills Mild call if, mostly due to throat irritation, no sputum production Denies any itchy eyes or itchy nose but admits to clear nasal discharge.  Past Medical History  Diagnosis Date  . Depression   . Hypertension   . Hypothyroidism   . Allergic rhinitis   . Anxiety    Past Surgical History  Procedure Date  . Tubal ligation   . Tonsillectomy          Objective:   Physical Exam  Constitutional: She appears well-developed and well-nourished.  HENT:  Head: Normocephalic and atraumatic.  Left Ear: External ear normal.  Mouth/Throat: Oropharynx is clear and moist.       Wax in the right ear. Left maxillary sinuses slightly tender to palpation Nose is slightly congested  Eyes: EOM are normal. Right eye exhibits no discharge. Left eye exhibits no discharge.  Neck:    Cardiovascular: Normal rate, regular rhythm and normal heart sounds.   Pulmonary/Chest: Effort normal and breath sounds normal. No respiratory distress. She has no wheezes. She has no rales.          Assessment & Plan:  Today , I spent more than 30  min with the patient, >50% of the time counseling and addressing several problems

## 2011-03-16 NOTE — Patient Instructions (Signed)
Zyrtec over-the-counter 10 mg one tablet a day Used the  eyedrops and nasal spray every day Amoxicillin for 10 days Call if no better in few days

## 2011-03-17 NOTE — Progress Notes (Signed)
Summary: needs CPX       LMOM   2/6    letter  2/16  Phone Note Outgoing Call   Call placed by: Army Fossa CMA,  January 21, 2011 7:48 AM Summary of Call: Please call pt and schedule CPX.   Follow-up for Phone Call        [facility] to call and schedule a CPX with Dr Drue Novel.Marland KitchenMarland KitchenJerolyn Shin  January 24, 2011 4:51 PM  Additional Follow-up for Phone Call Additional follow up Details #1::        mailed letter.Marland KitchenMarland KitchenJerolyn Shin  February 03, 2011 11:13 AM

## 2011-03-18 ENCOUNTER — Other Ambulatory Visit: Payer: Self-pay | Admitting: *Deleted

## 2011-03-18 MED ORDER — HYDROCHLOROTHIAZIDE 25 MG PO TABS: 25.0000 mg | ORAL_TABLET | Freq: Every day | ORAL | Status: DC

## 2011-03-18 MED ORDER — LEVOTHYROXINE SODIUM 100 MCG PO TABS: 100.0000 ug | ORAL_TABLET | Freq: Every day | ORAL | Status: DC

## 2011-05-03 NOTE — Assessment & Plan Note (Signed)
[facility]                         GASTROENTEROLOGY OFFICE NOTE   CHEYENNE, BORDEAUX                       MRN:          [id number]  DATE:10/09/2007                            DOB:          [birth year]    REFERRING PHYSICIAN:  Willow Ora, MD   REFERRING PHYSICIAN:  Willow Ora, M.D.   REASON FOR REFERRAL:  Dr. Drue Novel asked me to evaluate Sandra Owen in  consultation regarding recent nausea and vomiting, abnormal pancreatic  enzymes, abnormal CT scan and ultrasound.   HISTORY OF PRESENT ILLNESS:  Sandra Owen is a very pleasant 60 year old  woman whose father died of pancreatic cancer when he was 10 who had an  acute illness that began approximately 3 weeks ago.  She has nausea and  vomiting that was very profuse.  Abdominal pain was not a real  particular problem for her until after several days of vomiting.  She  presented to the emergency room and received IV fluids and antiemetics.  She was told she had a UTI, and she was put on Cipro.  Potassium was  repleted.  She went home and represented to the emergency room with the  same symptom complex without much improvement and was then admitted for  a 2-night stay at the hospital.  She had some diarrhea while she was  hospitalized.  During her hospitalization, it was noted that she had  mildly elevated pancreatic enzymes in the 100-200 range.  Her liver  tests were all normal.  Specifically, no elevation in her bilirubin or  alkaline phosphatase.  She did not have a white count.  She had imaging  tests performed.  Initially, a CT scan was done that did not confirm  acute pancreatic inflammation, but there was some nonspecific mild  mesenteric adenopathy.  Her gallbladder looked thickened and possibly  diseased.  She had followup ultrasound, and this did show a thick-walled  gallbladder with some sludge in it.  Her common duct was measured at 7  mm.  She has since left the hospital, and repeat pancreatic  enzymes were  performed.  They were still elevated (I have not seen those results  yet).  Symptomatically, she feels much better.  She has very mild nausea  only but not at all like the vomiting she was doing before.   REVIEW OF SYSTEMS:  Notable for over a year about a 10 pound weight gain  (she did lose most of this during her acute illness).  The rest of her  review of systems is essentially normal and is available on the nursing  intake sheet.   PAST MEDICAL HISTORY:  1. Hypertension.  2. Thyroid trouble.  3. History of kidney stones.  4. Tubal ligation.  5. Breasts augmentation.   CURRENT MEDICATIONS:  Synthroid.   ALLERGIES:  CODEINE CAUSES NAUSEA.   SOCIAL HISTORY:  Married with one son.  She works in Airline [employer].  Nonsmoker.  Very limited alcohol intake.   FAMILY HISTORY:  Father died of pancreatic cancer.  Grandfather had  heart disease.   PHYSICAL EXAMINATION:  VITAL SIGNS:  5 feet 4 inches,  131 pounds.  Blood  pressure 120/82, pulse 72.  CONSTITUTIONAL:  Generally well-appearing.  NEUROLOGIC:  Alert and oriented x3.  HEENT:  Extraocular movements intake.  Oropharynx moist with no lesions.  NECK:  Supple.  No lymphadenopathy.  CARDIOVASCULAR:  Regular rate and rhythm.  LUNGS:  Clear to auscultation bilaterally.  ABDOMEN:  Soft, nontender, nondistended.  Normal bowel sounds.  EXTREMITIES:  No lower extremity edema.  SKIN:  No rashes or lesions on visible extremities.   ASSESSMENT AND PLAN:  60 year old woman with recent nausea and vomiting  illness, elevated pancreatic enzymes, abnormal imaging.   It is not clear what her acute illness was from.  Perhaps she did have a  prolonged viral gastroenteritis.  This does not usually lead to such  pancreatic enzyme elevations though and should not affect the  gallbladder common duct.  It is certainly possible that she is having  biliary complications such as gallstone disease.  She does have sludge  in her gallbladder, and  this could be symptomatic, possibly causing mild  pancreatitis.  That would certainly be a diagnosis that would likely  explain all of her findings.  She does have a family history of  pancreatic cancer, and her common bile duct is slightly elevated, so  that raises some concern as well.   I recommended that we proceed with endoscopic ultrasound in the next  week or two.  She wants to defer that for now.  She says that her  company just got bought, and she really cannot miss work, and so we  compromised.  She will return to see me in 2 to 3 weeks' time to see how  she is doing clinically.  We will likely repeat some lab testing at that  point.  I will get a repeat set of amylase and lipase today as well as a  complete metabolic profile and complete blood count.  She knows to call  if she has any further gastrointestinal symptoms.     Rachael Fee, MD  Electronically Signed    DPJ/MedQ  DD: 10/09/2007  DT: 10/09/2007  Job #: [id number]   cc:   Willow Ora, MD

## 2011-05-03 NOTE — Discharge Summary (Signed)
Sandra Owen, Sandra Owen                ACCOUNT NO.:  [id number]   MEDICAL RECORD NO.:  [id number]          PATIENT TYPE:  INP   LOCATION:  1339                         FACILITY:  [facility]   PHYSICIAN:  Gordy Savers, MDDATE OF BIRTH:  1951/01/02   DATE OF ADMISSION:  09/28/2007  DATE OF DISCHARGE:  09/30/2007                               DISCHARGE SUMMARY   DISCHARGE DIAGNOSES:  1. Intractable nausea, vomiting and diarrhea, probable prolonged viral      gastroenteritis.  2. Hypertension.  3. History of depression.  4. Hypothyroidism.  5. Recent urinary tract infection.   HISTORY OF PRESENT ILLNESS:  The patient is a 60 year old female who was  initially treated in the emergency department 5 days prior to admission  with nausea, vomiting, some abdominal discomfort.  She was diagnosed  with a UTI and was treated, but continued to have nausea, vomiting,  diarrhea and was intolerant to p.o. intake.  The patient was thus  admitted for further evaluation and treatment of her refractory  symptoms.   LABORATORY DATA AND X-RAY FINDINGS:  CBC performed was unremarkable with  white count of 5.9, hemoglobin 14.2.  Electrolytes were unremarkable  except for a sodium of 2.7.   HOSPITAL COURSE:  She received oral and IV potassium supplementation.  CT of the abdomen revealed prominent mesenteric lymph nodes.  The  patient was seen in consultation by the GI service to rule out  cholecystitis since there was a question of some gallbladder wall edema  on the abdominal CT.  The pancreas apparently appeared normal.  The GI  service favored a prolonged gastroenteritis syndrome and in view of some  mild elevation in lipase, clinically was not felt to have pancreatitis.  The patient was treated with IV fluids and received symptomatic  treatment and steadily improved.  At the time of discharge, she is  tolerating a regular diet and had very mild nausea only.  She had no  recurrent abdominal pain.   DISPOSITION:  The patient was discharged today and has been asked to  return to the care of her primary care Emilee Market for followup in a few  days.   DISCHARGE MEDICATIONS:  1. Phenergan 25 mg every 4-6 hours p.r.n. nausea.  2. Wellbutrin XL 150 daily.  3. Synthroid 0.088 mg daily.  4. Her hydrochlorothiazide will be held and her blood pressure      reassessed next clinic visit.   CONDITION ON DISCHARGE:  Improved.      Gordy Savers, MD  Electronically Signed     PFK/MEDQ  D:  09/30/2007  T:  10/01/2007  Job:  [id number]

## 2011-05-03 NOTE — H&P (Signed)
Sandra Owen, Sandra Owen                ACCOUNT NO.:  [id number]   MEDICAL RECORD NO.:  [id number]          PATIENT TYPE:  INP   LOCATION:  1339                         [facility]ACILITY:  [facility]   PHYSICIAN:  Valerie A. Felicity Coyer, MDDATE OF BIRTH:  April 11, 1951   DATE OF ADMISSION:  09/28/2007  DATE OF DISCHARGE:                              HISTORY & PHYSICAL   PRIMARY CARE PHYSICIAN:  Willow Ora   CHIEF COMPLAINT:  Abdominal pain with nausea, vomiting, for greater than  1 week.   HISTORY OF PRESENT ILLNESS:  The patient is a 60 year old white female  with minor chronic medical problems as listed below, seen in the  emergency room at [facility] 5 days ago, for symptoms of nausea,  vomiting with abdominal discomfort, diagnosed with UTI and discharged  home, that returns to [facility] ER today, due to continued nausea,  vomiting and intolerance of p.o.  She says middle last week,  approximately 8 days ago, early symptoms on October 3rd with flu-like  fatigue, followed in the next 24 hours of systemic uneasiness and  rumbling, progressive nausea, anorexia and vomiting, both with and  without food.  She tried diet modification to make her diet more bland,  and has eaten nothing but rice, bread, cereal, but even at that unable  to tolerate this p.o.  She has able to tolerate her usual medications,  including those prescribed at the ER visit for UTI October 5th.  She has  had intermittent diarrhea during this period and abdominal rumbling  but no specific pain in the epigastric, thoracal, lower quadrant or  right upper quadrant region.  She has had no melena or blood, no  hematemesis, no previous history of pancreatitis, no alcohol binges, no  abdominal trauma, but does note positive family history of this  gallbladder disease, as well as a father who died in his early 27s due  to pancreatic cancer.   PAST MEDICAL HISTORY:  Significant for:  1. Hypertension.  2. Hypothyroid disease.  3.  Depression.  4. History of right foot bunion surgery.  5. Status post bilateral tubal ligation.  6. History of bilateral silicone breast implants.   CURRENT MEDICATIONS:  1. Wellbutrin XL 150 mg daily.  2. HCTZ 25 mg daily.  3. Synthroid 88 mcg daily.  4. Vitamin E supplement.  5. Prescription for Cipro 500 b.i.d. x5 days, prescribed October 5.   ALLERGIES:  INTOLERANCE TO CODEINE.   FAMILY HISTORY:  Mother died at age 31, due to a ruptured brain  aneurysm, had history of hypertension.  Her father died at age 68, due  to pancreatic cancer.   SOCIAL HISTORY:  She lives at home with her spouse.  She does not smoke  and has rare alcohol use.   REVIEW OF SYSTEMS:  No weight changes, positive bloating throughout her  abdomen over the last week, with the above symptoms in HPI.  No chest  pain, shortness of breath.  No dizziness or vertigo, syncope or pre-  syncope.  No cough, no fever or chills, no dysuria.  No other review of  systems.  Positive on complete review.  Please see HPI above for other  positive details.   PHYSICAL EXAM:  VITAL SIGNS:  Temperature 97.6, blood pressure 126/66,  pulse ranging 80 to low 100s, respiration rate 27, satting 94% on room  air.  GENERAL:  She is a pleasant, healthy-appearing, middle-aged white female  in no acute distress.  Her spouse is present at bedside for the history  part of this test.  HEENT:  Normocephalic, atraumatic.  PERL.  EOMI.  Oropharynx clear and  dry.  NECK:  Supple without JVD, no carotid bruits.  LUNGS:  Clear to auscultation bilaterally without wheeze, crackle.  CARDIOVASCULAR:  Regular rate and rhythm.  ABDOMEN:  Soft and surprisingly nontender to moderate deep palpation.  Good bowel sounds throughout with hyperactive rumbling.  No rebound or  guarding.  EXTREMITIES:  Show no edema.  No swelling.  NEUROLOGICAL:  She is awake, alert, oriented x4 and no motor or sensory  deficits grossly observed.  Gait not tested.    LABORATORY DATA:  CBC unremarkable with a normal white count of 5.9,  hemoglobin of 14.2, platelet count of 261, sodium of 135, potassium of  2.7, chloride 98, bicarb 26, BUN 11, creatinine 1.02, glucose of 111.  LFTs are normal, except for an albumin of 3.1, lipase elevated at 83. CT  abdomen and pelvic shows the following findings:  1. Prominent mesenteric lymph nodes, question mesenteric adenitis.  2. Question changes of early acute cholecystitis, no stones seen.  3. Question of rupture/leak of left silicone implant encapsulation.   PLAN:  1. Abdominal pain with nausea, vomiting.  Symptoms greater than 1 week      and elevated lipase, to me suggestive of acute pancreatitis.      However, there are no CT changes of pancreatic abnormality noted or      evidence of gallbladder stones.  Question gallbladder edema,      consistent with possible cholecystitis, which she has normal LFTs      and no exam findings to suggest right upper quadrant abnormality;      however, gallstones could be a possible trigger for the elevated      lipase.  Will admit, keep n.p.o., give IV fluid as well as      symptomatic treatment for nausea and vomiting, check abdominal      ultrasound to rule out stones and further gallbladder disease.      Will ask GI to see, to help clarify diagnosis,  I question if ERCP      or ORCP, would be appropriate, especially if given negative      ultrasound due to her family history of pancreatic cancer with      father dying at age 14, which is close to her age.  Will hold HCTZ,      as this may be a possible trigger for pancreatitis and her current      symptoms.  Re-check CBC and CMET in the a.m.  2. Likely secondary to GI loss of vomiting and diarrhea, as well as      diuretic effect as tolerated.  Replace potassium in the IV fluid      supplementation and recheck in the a.m.  3. Hypertension.  Hold HCTZ as noted above, also given normal blood      pressure.  4.  Hypothyroidism.  Continue home Synthroid or her equivalent IV while      n.p.o.  5. History of depression, hold Wellbutrin while n.p.o.  No active      symptoms at this time.  6. Question recent UTI.  Diagnosis at October 5th ER visit.  Given      prescription for Cipro, but unable to tolerate due to nausea,      vomiting symptoms of #1.  Afebrile and normal white count, but will      treat with empiric Unison, pending workup and results of  #1.  Send urine culture and followup details.  1. Question left breast implants/leak on CT.  I have discussed this      with the patient in private, and she will pursue outpatient follow-      up mammogram versus MRI as suggested.  The patient has no      symptomatic pain, swelling of her left breast or chest and feels      that this incidental finding on CT is unlikely related to problem      number      one.  Please note:  The patient's spouse is unaware of the      patient's implants, and the patient wishes for him not to know of      this diagnosis.  Therefore, I have discussed this with her in      private, and she understands both the possible diagnosis and need      for followup.  Please see orders for further details.      Valerie A. Felicity Coyer, MD  Electronically Signed     VAL/MEDQ  D:  09/28/2007  T:  09/29/2007  Job:  [id number]

## 2011-06-20 ENCOUNTER — Other Ambulatory Visit: Payer: Self-pay | Admitting: *Deleted

## 2011-06-20 MED ORDER — HYDROCODONE-ACETAMINOPHEN 5-500 MG PO TABS: ORAL_TABLET | ORAL | Status: DC

## 2011-06-20 NOTE — Telephone Encounter (Signed)
Ok #30, 1 RF 

## 2011-08-29 ENCOUNTER — Ambulatory Visit (INDEPENDENT_AMBULATORY_CARE_PROVIDER_SITE_OTHER): Payer: 59 | Admitting: Internal Medicine

## 2011-08-29 ENCOUNTER — Encounter: Payer: Self-pay | Admitting: Internal Medicine

## 2011-08-29 DIAGNOSIS — J069 Acute upper respiratory infection, unspecified: Secondary | ICD-10-CM

## 2011-08-29 MED ORDER — AZITHROMYCIN 250 MG PO TABS: ORAL_TABLET | ORAL | Status: AC

## 2011-08-29 NOTE — Patient Instructions (Addendum)
Rest, fluids , tylenol For cough, take Mucinex DM  twice a day as needed  Take the antibiotic as prescribed, zithromax If the cough persist , use vicodin as well Call if no better in few days Call anytime if the symptoms are severe, you have high fever, short of breath

## 2011-08-29 NOTE — Progress Notes (Signed)
  Subjective:    Patient ID: Sandra Owen, female    DOB: 07/20/1951, 60 y.o.   MRN: [id number]  HPI Symptoms started 6 days ago with sinus congestion, cough. Now the symptoms are " moving to the chest". She is taking Mucinex D  Past Medical History  Diagnosis Date  . Depression   . Hypertension   . Hypothyroidism   . Allergic rhinitis   . Anxiety    Past Surgical History  Procedure Date  . Tubal ligation   . Tonsillectomy     Review of Systems No fever Mild nausea without vomiting No nasal discharge but some green sputum production with cough.    Objective:   Physical Exam  Constitutional: She is oriented to person, place, and time. She appears well-developed and well-nourished.  HENT:  Head: Normocephalic and atraumatic.       Mild amount of wax on the right, left TM normal. Slightly tender at the left maxillary area  Cardiovascular: Normal rate, regular rhythm and normal heart sounds.   No murmur heard. Pulmonary/Chest: Effort normal and breath sounds normal. No respiratory distress. She has no wheezes.  Lymphadenopathy:    She has no cervical adenopathy.  Neurological: She is alert and oriented to person, place, and time.          Assessment & Plan:  URI: Symptoms consistent with a URI, maybe early sinusitis. See instructions. BP is slightly elevated, recommend to change from Mucinex D to Mucinex DM  She is also due for a physical, patient aware, will schedule at her earliest convenience

## 2011-09-01 ENCOUNTER — Telehealth: Payer: Self-pay | Admitting: *Deleted

## 2011-09-01 MED ORDER — PREDNISONE 20 MG PO TABS: 20.0000 mg | ORAL_TABLET | Freq: Every day | ORAL | Status: DC

## 2011-09-01 NOTE — Telephone Encounter (Signed)
Pt states that her symptoms are only slightly improved [cough, hoarseness, mucus] and that you had discussed maybe a new ABX if Zpack was ineffective.  Patient would like to know what you recommend.

## 2011-09-01 NOTE — Telephone Encounter (Signed)
Addended by: Regis Bill on: 09/01/2011 03:33 PM   Modules accepted: Orders

## 2011-09-01 NOTE — Telephone Encounter (Signed)
Call  Prednisone 20 mg one tablet daily for 5 days.#5, no RF Please explain patient this will help with inflammation. Patient to call in a few days if not improving

## 2011-09-09 ENCOUNTER — Ambulatory Visit (INDEPENDENT_AMBULATORY_CARE_PROVIDER_SITE_OTHER): Payer: 59 | Admitting: Internal Medicine

## 2011-09-09 ENCOUNTER — Encounter: Payer: Self-pay | Admitting: Internal Medicine

## 2011-09-09 VITALS — BP 140/96 | Temp 98.5°F | Wt 140.2 lb

## 2011-09-09 DIAGNOSIS — N390 Urinary tract infection, site not specified: Secondary | ICD-10-CM

## 2011-09-09 LAB — POCT URINALYSIS DIPSTICK
Bilirubin, UA: NEGATIVE
Glucose, UA: NEGATIVE
Ketones, UA: NEGATIVE
Nitrite, UA: NEGATIVE
Protein, UA: NEGATIVE
Spec Grav, UA: 1.015
Urobilinogen, UA: 0.2
pH, UA: 6

## 2011-09-09 MED ORDER — CIPROFLOXACIN HCL 500 MG PO TABS: 500.0000 mg | ORAL_TABLET | Freq: Two times a day (BID) | ORAL | Status: AC

## 2011-09-09 NOTE — Patient Instructions (Signed)
Lots of fluids, cipro x 5 days Call if fever, increase pain, no better in few days

## 2011-09-09 NOTE — Progress Notes (Signed)
  Subjective:    Patient ID: Sandra Owen, female    DOB: Mar 14, 1951, 60 y.o.   MRN: [id number]  HPI 5 days history of mild urinary frequency and dysuria along with lower back pain. The back pain does not have radicular features but somehow it feels like radiates to the lower abdomen.   Past Medical History  Diagnosis Date  . Depression   . Hypertension   . Hypothyroidism   . Allergic rhinitis   . Anxiety    Past Surgical History  Procedure Date  . Tubal ligation   . Tonsillectomy      Review of Systems No fever or chills Some nausea for 2 days, no vomiting, mild diarrhea. No vaginal discharge or bleeding. Next item no gross hematuria Appetite is slightly decreased d/t nausea, normal bowel movements, no constipation.     Objective:   Physical Exam  Constitutional: She is oriented to person, place, and time. She appears well-developed and well-nourished. No distress.  HENT:  Head: Normocephalic and atraumatic.  Abdominal:       No CVA tenderness, not distended, soft, symmetric bilateral lower abdominal discomfort with palpation, no mass or rebound. Normal bowel sounds.  Musculoskeletal: She exhibits no edema.  Neurological: She is alert and oriented to person, place, and time.  Skin: She is not diaphoretic.       Assessment & Plan:  UTI: Symptoms most likely due to UTI, udip mildly + Plan: Urine culture, Cipro, see instructions

## 2011-09-11 LAB — URINE CULTURE
Colony Count: NO GROWTH
Organism ID, Bacteria: NO GROWTH

## 2011-09-15 ENCOUNTER — Telehealth: Payer: Self-pay | Admitting: *Deleted

## 2011-09-15 NOTE — Telephone Encounter (Signed)
Message copied by Regis Bill on Thu Sep 15, 2011  5:01 PM ------      Message from: Willow Ora E      Created: Tue Sep 13, 2011 10:19 AM       Advise patient, culture came back negative, stop antibiotics.      Please ask if she is feeling better, if she   is not : let me know

## 2011-09-15 NOTE — Telephone Encounter (Signed)
Patient Informed; she is feeling better. Mailed results.

## 2011-09-25 ENCOUNTER — Other Ambulatory Visit: Payer: Self-pay | Admitting: Internal Medicine

## 2011-09-26 NOTE — Telephone Encounter (Signed)
Done

## 2011-09-29 LAB — BASIC METABOLIC PANEL
BUN: 27 — ABNORMAL HIGH
BUN: 4 — ABNORMAL LOW
CO2: 24
CO2: 27
Calcium: 8.1 — ABNORMAL LOW
Calcium: 9.2
Chloride: 100
Chloride: 106
Creatinine, Ser: 0.9
Creatinine, Ser: 0.97
GFR calc Af Amer: >60
GFR calc Af Amer: >60
GFR calc non Af Amer: 60 — ABNORMAL LOW
GFR calc non Af Amer: >60
Glucose, Bld: 123 — ABNORMAL HIGH
Glucose, Bld: 73
Potassium: 3 — ABNORMAL LOW
Potassium: 3.3 — ABNORMAL LOW
Sodium: 138
Sodium: 139

## 2011-09-29 LAB — HEPATIC FUNCTION PANEL
ALT: 13
AST: 18
Albumin: 2.7 — ABNORMAL LOW
Alkaline Phosphatase: 42
Bilirubin, Direct: 0.1
Indirect Bilirubin: 1.1 — ABNORMAL HIGH
Total Bilirubin: 1.2
Total Protein: 5 — ABNORMAL LOW

## 2011-09-29 LAB — COMPREHENSIVE METABOLIC PANEL
ALT: 15
ALT: 16
AST: 17
AST: 17
Albumin: 2.8 — ABNORMAL LOW
Albumin: 3.1 — ABNORMAL LOW
Alkaline Phosphatase: 43
Alkaline Phosphatase: 47
BUN: 11
BUN: 6
CO2: 24
CO2: 26
Calcium: 8 — ABNORMAL LOW
Calcium: 8.4
Chloride: 104
Chloride: 98
Creatinine, Ser: 0.8
Creatinine, Ser: 1.02
GFR calc Af Amer: >60
GFR calc Af Amer: >60
GFR calc non Af Amer: 56 — ABNORMAL LOW
GFR calc non Af Amer: >60
Glucose, Bld: 110 — ABNORMAL HIGH
Glucose, Bld: 77
Potassium: 2.7 — CL
Potassium: 3.1 — ABNORMAL LOW
Sodium: 135
Sodium: 137
Total Bilirubin: 0.4
Total Bilirubin: 1.1
Total Protein: 5 — ABNORMAL LOW
Total Protein: 5.9 — ABNORMAL LOW

## 2011-09-29 LAB — URINE MICROSCOPIC-ADD ON

## 2011-09-29 LAB — DIFFERENTIAL
Basophils Absolute: 0
Basophils Absolute: 0
Basophils Relative: 0
Basophils Relative: 0
Eosinophils Absolute: 0.1
Eosinophils Absolute: 0.2
Eosinophils Relative: 2
Eosinophils Relative: 3
Lymphocytes Relative: 17
Lymphocytes Relative: 25
Lymphs Abs: 1.1
Lymphs Abs: 1.5
Monocytes Absolute: 0.6
Monocytes Absolute: 0.9 — ABNORMAL HIGH
Monocytes Relative: 15 — ABNORMAL HIGH
Monocytes Relative: 9
Neutro Abs: 3.5
Neutro Abs: 4.3
Neutrophils Relative %: 59
Neutrophils Relative %: 70

## 2011-09-29 LAB — URINALYSIS, ROUTINE W REFLEX MICROSCOPIC
Glucose, UA: NEGATIVE
Glucose, UA: NEGATIVE
Ketones, ur: 40 — AB
Ketones, ur: >80 — AB
Nitrite: NEGATIVE
Nitrite: NEGATIVE
Protein, ur: 100 — AB
Protein, ur: 30 — AB
Specific Gravity, Urine: 1.026
Specific Gravity, Urine: 1.028
Urobilinogen, UA: 0.2
Urobilinogen, UA: 1
pH: 6
pH: 6.5

## 2011-09-29 LAB — CBC
HCT: 36.7
HCT: 36.9
HCT: 40
HCT: 45.7
Hemoglobin: 12.9
Hemoglobin: 12.9
Hemoglobin: 14.2
Hemoglobin: 15.8 — ABNORMAL HIGH
MCHC: 34.6
MCHC: 35.1
MCHC: 35.3
MCHC: 35.4
MCV: 90
MCV: 90.1
MCV: 90.7
MCV: 92.1
Platelets: 250
Platelets: 261
Platelets: 262
Platelets: 312
RBC: 4.04
RBC: 4.09
RBC: 4.45
RBC: 4.96
RDW: 12.3
RDW: 12.3
RDW: 12.4
RDW: 12.4
WBC: 4
WBC: 4.1
WBC: 5.9
WBC: 6.1

## 2011-09-29 LAB — AMYLASE
Amylase: 148 — ABNORMAL HIGH
Amylase: 179 — ABNORMAL HIGH

## 2011-09-29 LAB — URINE CULTURE: Colony Count: 8000

## 2011-09-29 LAB — LIPASE, BLOOD
Lipase: 100 — ABNORMAL HIGH
Lipase: 83 — ABNORMAL HIGH

## 2011-11-21 ENCOUNTER — Other Ambulatory Visit: Payer: Self-pay | Admitting: Internal Medicine

## 2011-11-21 MED ORDER — HYDROCODONE-ACETAMINOPHEN 5-500 MG PO TABS: ORAL_TABLET | ORAL | Status: DC

## 2011-11-21 NOTE — Telephone Encounter (Signed)
Last OV 09/09/11. Last fill date 06/20/11

## 2011-11-21 NOTE — Telephone Encounter (Signed)
Verbal refill provided to pharmacy.

## 2011-11-21 NOTE — Telephone Encounter (Signed)
Ok #30, 1 RF 

## 2011-12-19 ENCOUNTER — Other Ambulatory Visit: Payer: Self-pay | Admitting: Internal Medicine

## 2012-03-01 ENCOUNTER — Other Ambulatory Visit: Payer: Self-pay | Admitting: Internal Medicine

## 2012-03-01 NOTE — Telephone Encounter (Signed)
Refill done.  

## 2012-03-18 ENCOUNTER — Other Ambulatory Visit: Payer: Self-pay | Admitting: Internal Medicine

## 2012-03-19 NOTE — Telephone Encounter (Signed)
Refill done.  

## 2012-05-31 ENCOUNTER — Encounter: Payer: Self-pay | Admitting: Internal Medicine

## 2012-05-31 ENCOUNTER — Ambulatory Visit (INDEPENDENT_AMBULATORY_CARE_PROVIDER_SITE_OTHER): Payer: 59 | Admitting: Internal Medicine

## 2012-05-31 VITALS — BP 128/86 | HR 60 | Temp 98.2°F | Wt 142.0 lb

## 2012-05-31 DIAGNOSIS — E039 Hypothyroidism, unspecified: Secondary | ICD-10-CM

## 2012-05-31 DIAGNOSIS — M1990 Unspecified osteoarthritis, unspecified site: Secondary | ICD-10-CM

## 2012-05-31 DIAGNOSIS — K529 Noninfective gastroenteritis and colitis, unspecified: Secondary | ICD-10-CM

## 2012-05-31 DIAGNOSIS — I10 Essential (primary) hypertension: Secondary | ICD-10-CM

## 2012-05-31 DIAGNOSIS — N390 Urinary tract infection, site not specified: Secondary | ICD-10-CM

## 2012-05-31 DIAGNOSIS — K5289 Other specified noninfective gastroenteritis and colitis: Secondary | ICD-10-CM

## 2012-05-31 LAB — POCT URINALYSIS DIPSTICK
Glucose, UA: NEGATIVE
Nitrite, UA: NEGATIVE
Spec Grav, UA: 1.025
Urobilinogen, UA: 0.2
pH, UA: 6

## 2012-05-31 LAB — TSH: TSH: 8.02 u[IU]/mL — ABNORMAL HIGH (ref 0.35–5.50)

## 2012-05-31 MED ORDER — HYDROCODONE-ACETAMINOPHEN 5-500 MG PO TABS: ORAL_TABLET | ORAL | Status: DC

## 2012-05-31 MED ORDER — ONDANSETRON HCL 4 MG PO TABS: 4.0000 mg | ORAL_TABLET | Freq: Three times a day (TID) | ORAL | Status: AC | PRN

## 2012-05-31 MED ORDER — CYCLOBENZAPRINE HCL 10 MG PO TABS: 10.0000 mg | ORAL_TABLET | Freq: Three times a day (TID) | ORAL | Status: DC | PRN

## 2012-05-31 NOTE — Patient Instructions (Addendum)
Rest, drink plenty of fluids, bland diet peptobismol as needed for diarrhea zofran as needed for nausea Call if no better in 2-3 days, call any time if symptoms severe: increase pain-nausea, fever chills, blood in stools

## 2012-05-31 NOTE — Assessment & Plan Note (Signed)
RF meds for prn use, has upper back pain, neck pain

## 2012-05-31 NOTE — Assessment & Plan Note (Signed)
No uti sx but udip abnormal Check a UCX, treat if appropriate

## 2012-05-31 NOTE — Assessment & Plan Note (Signed)
Has been holding HCTZ x few days due to GI sx, will check a BMP on RTC

## 2012-05-31 NOTE — Progress Notes (Addendum)
  Subjective:    Patient ID: Sandra Owen, female    DOB: Sep 05, 1951, 61 y.o.   MRN: [id number]  HPI Followup Hypothyroidism, good medication compliance except for the last 5 days,  request a TSH Hypertension, good medication compliance except for the last 5 days, she has been skipping her BP medication due to GI symptoms DJD, request a refill of Flexeril and Vicodin which she takes sporadically. Also 5 days history of mild nausea, not necessarily worse with by mouth intake, associated with a small amount of diarrhea---> soft stools on and off; + epigastric pain on and off which is not worse with food intake.  Past Medical History  Diagnosis Date  . Depression   . Hypertension   . Hypothyroidism   . Allergic rhinitis   . Anxiety    Past Surgical History  Procedure Date  . Tubal ligation   . Tonsillectomy      Review of Systems Initially with the onset of GI symptoms she had low-grade fevers and chills, those symptoms are gone. Denies any blood in the stools, no sick contacts, no recent antibiotic intake. No classic GERD symptoms. Also no dysuria or gross hematuria.    Objective:   Physical Exam General -- alert, well-developed, and well-nourished.   Lungs -- normal respiratory effort, no intercostal retractions, no accessory muscle use, and normal breath sounds.   Heart-- normal rate, regular rhythm, no murmur, and no gallop.   Abdomen--soft, non-tender, no distention, no masses, no HSM, no guarding, and no rigidity.  Good bowel sounds Extremities-- no pretibial edema bilaterally Neurologic-- alert & oriented X3 and strength normal in all extremities. Psych-- Cognition and judgment appear intact. Alert and cooperative with normal attention span and concentration.  not anxious appearing and not depressed appearing.       Assessment & Plan:

## 2012-05-31 NOTE — Assessment & Plan Note (Signed)
Due for TSH, labs

## 2012-05-31 NOTE — Assessment & Plan Note (Addendum)
Sx c/w AGE, no red flag symptoms. See instructions

## 2012-06-02 LAB — URINE CULTURE: Colony Count: 6000

## 2012-06-05 MED ORDER — LEVOTHYROXINE SODIUM 137 MCG PO TABS: 137.0000 ug | ORAL_TABLET | Freq: Every day | ORAL | Status: DC

## 2012-06-05 NOTE — Addendum Note (Signed)
Addended by: Edwena Felty T on: 06/05/2012 10:50 AM   Modules accepted: Orders

## 2012-08-06 ENCOUNTER — Telehealth: Payer: Self-pay | Admitting: Internal Medicine

## 2012-08-06 MED ORDER — HYDROCHLOROTHIAZIDE 25 MG PO TABS: 25.0000 mg | ORAL_TABLET | Freq: Every day | ORAL | Status: DC

## 2012-08-06 NOTE — Telephone Encounter (Signed)
Refill: Hydrochlorothiazide tab. Request sent from [facility]

## 2012-08-06 NOTE — Telephone Encounter (Signed)
Refill done.  

## 2012-08-15 ENCOUNTER — Telehealth: Payer: Self-pay | Admitting: Internal Medicine

## 2012-08-15 NOTE — Telephone Encounter (Signed)
Needs a TSH, DX hypothyroidism. Please schedule

## 2012-08-16 NOTE — Telephone Encounter (Signed)
LMOVM for pt to return call 

## 2012-08-17 NOTE — Telephone Encounter (Signed)
LMVOM for pt to return call.

## 2012-08-21 ENCOUNTER — Encounter: Payer: Self-pay | Admitting: *Deleted

## 2012-08-21 NOTE — Telephone Encounter (Signed)
Unable to get in touch with pt, mailed letter.  

## 2012-09-05 ENCOUNTER — Telehealth: Payer: Self-pay | Admitting: Internal Medicine

## 2012-09-05 NOTE — Telephone Encounter (Signed)
LM ON TRIAGE LINE 1227pm 9.18.13 -- refill Synthrex B needs 90-day supply wrt. rx for mail order, wants to pick up tomorrow morning at her lab appt.

## 2012-09-05 NOTE — Telephone Encounter (Signed)
Left msg for pt to return call & clariy refill request.

## 2012-09-06 ENCOUNTER — Other Ambulatory Visit (INDEPENDENT_AMBULATORY_CARE_PROVIDER_SITE_OTHER): Payer: 59

## 2012-09-06 ENCOUNTER — Telehealth: Payer: Self-pay | Admitting: Internal Medicine

## 2012-09-06 DIAGNOSIS — E039 Hypothyroidism, unspecified: Secondary | ICD-10-CM

## 2012-09-06 LAB — TSH: TSH: 0.11 u[IU]/mL — ABNORMAL LOW (ref 0.35–5.50)

## 2012-09-06 NOTE — Telephone Encounter (Signed)
Left message on voicemail for patient to call back. 

## 2012-09-06 NOTE — Progress Notes (Signed)
Labs only

## 2012-09-06 NOTE — Telephone Encounter (Signed)
Semprex-D 8-60 mg  Qty :90 day  To be sent to [facility]

## 2012-09-06 NOTE — Telephone Encounter (Signed)
Ok to refill? Or does the pt need an OV.

## 2012-09-07 MED ORDER — CETIRIZINE HCL 10 MG PO TABS: 10.0000 mg | ORAL_TABLET | Freq: Every day | ORAL | Status: DC

## 2012-09-07 MED ORDER — AZELASTINE HCL 0.1 % NA SOLN: 2.0000 | Freq: Two times a day (BID) | NASAL | Status: DC

## 2012-09-07 NOTE — Telephone Encounter (Signed)
LMOVM for pt to return call 

## 2012-09-07 NOTE — Addendum Note (Signed)
Addended by: Edwena Felty T on: 09/07/2012 04:17 PM   Modules accepted: Orders

## 2012-09-07 NOTE — Telephone Encounter (Signed)
SEMPREX-D  Is a combination of acrivastine and pseudoephedrine , I do not recommend daily use of pseudoephedrine. I recommend Zyrtec 10 mg daily , ok to  provide a prescription if needed. If  allergies and nose congestion are her main concern, we can add Astelin nasal spray  2 sprays twice a day for better control.

## 2012-09-07 NOTE — Telephone Encounter (Signed)
Called pt again. Left detailed msg on pt's vmail & sent in rx to pharmacy. Instructed pt if she has any questions to call the office.

## 2012-09-07 NOTE — Telephone Encounter (Signed)
Pt called checking status refill request plz advise MW

## 2012-09-07 NOTE — Telephone Encounter (Signed)
Duplicate

## 2012-09-11 ENCOUNTER — Other Ambulatory Visit: Payer: Self-pay

## 2012-09-11 MED ORDER — LEVOTHYROXINE SODIUM 125 MCG PO TABS: 125.0000 ug | ORAL_TABLET | Freq: Every day | ORAL | Status: DC

## 2012-09-11 NOTE — Telephone Encounter (Signed)
rx sent to pt's pharmacy

## 2012-11-01 ENCOUNTER — Other Ambulatory Visit: Payer: Self-pay | Admitting: Internal Medicine

## 2012-11-02 NOTE — Telephone Encounter (Signed)
Ok to refill 

## 2012-11-02 NOTE — Telephone Encounter (Signed)
Advise patient, due for a office visit, please arrange; will RF #60

## 2012-12-07 ENCOUNTER — Encounter: Payer: Self-pay | Admitting: Internal Medicine

## 2012-12-07 ENCOUNTER — Ambulatory Visit (INDEPENDENT_AMBULATORY_CARE_PROVIDER_SITE_OTHER): Payer: 59 | Admitting: Internal Medicine

## 2012-12-07 ENCOUNTER — Encounter: Payer: Self-pay | Admitting: Lab

## 2012-12-07 VITALS — BP 132/84 | HR 69 | Temp 98.5°F | Wt 147.0 lb

## 2012-12-07 DIAGNOSIS — E039 Hypothyroidism, unspecified: Secondary | ICD-10-CM

## 2012-12-07 DIAGNOSIS — R635 Abnormal weight gain: Secondary | ICD-10-CM

## 2012-12-07 DIAGNOSIS — I10 Essential (primary) hypertension: Secondary | ICD-10-CM

## 2012-12-07 DIAGNOSIS — M1990 Unspecified osteoarthritis, unspecified site: Secondary | ICD-10-CM

## 2012-12-07 LAB — CBC WITH DIFFERENTIAL/PLATELET
Basophils Absolute: 0.1 10*3/uL (ref 0.0–0.1)
Basophils Relative: 1.4 % (ref 0.0–3.0)
Eosinophils Absolute: 0.2 10*3/uL (ref 0.0–0.7)
Eosinophils Relative: 5.1 % — ABNORMAL HIGH (ref 0.0–5.0)
HCT: 38.7 % (ref 36.0–46.0)
Hemoglobin: 13.3 g/dL (ref 12.0–15.0)
Lymphocytes Relative: 40.3 % (ref 12.0–46.0)
Lymphs Abs: 1.8 10*3/uL (ref 0.7–4.0)
MCHC: 34.4 g/dL (ref 30.0–36.0)
MCV: 91.9 fl (ref 78.0–100.0)
Monocytes Absolute: 0.4 10*3/uL (ref 0.1–1.0)
Monocytes Relative: 10 % (ref 3.0–12.0)
Neutro Abs: 1.9 10*3/uL (ref 1.4–7.7)
Neutrophils Relative %: 43.2 % (ref 43.0–77.0)
Platelets: 208 10*3/uL (ref 150.0–400.0)
RBC: 4.21 Mil/uL (ref 3.87–5.11)
RDW: 13.2 % (ref 11.5–14.6)
WBC: 4.4 10*3/uL — ABNORMAL LOW (ref 4.5–10.5)

## 2012-12-07 LAB — BASIC METABOLIC PANEL
BUN: 23 mg/dL (ref 6–23)
CO2: 31 mEq/L (ref 19–32)
Calcium: 9.4 mg/dL (ref 8.4–10.5)
Chloride: 105 mEq/L (ref 96–112)
Creatinine, Ser: 0.8 mg/dL (ref 0.4–1.2)
GFR: 73.46 mL/min (ref >60.00)
Glucose, Bld: 100 mg/dL — ABNORMAL HIGH (ref 70–99)
Potassium: 3.7 mEq/L (ref 3.5–5.1)
Sodium: 143 mEq/L (ref 135–145)

## 2012-12-07 LAB — TSH: TSH: 0.13 u[IU]/mL — ABNORMAL LOW (ref 0.35–5.50)

## 2012-12-07 MED ORDER — HYDROCHLOROTHIAZIDE 25 MG PO TABS: 25.0000 mg | ORAL_TABLET | Freq: Every day | ORAL | Status: DC

## 2012-12-07 MED ORDER — HYDROCODONE-ACETAMINOPHEN 5-500 MG PO TABS: 1.0000 | ORAL_TABLET | Freq: Every evening | ORAL | Status: DC | PRN

## 2012-12-07 NOTE — Assessment & Plan Note (Signed)
Well-controlled,  check a BMP 

## 2012-12-07 NOTE — Progress Notes (Signed)
  Subjective:    Patient ID: Sandra Owen, female    DOB: 26-Mar-1951, 61 y.o.   MRN: [id number]  HPI Routine office visit Hypothyroidism, good medication compliance. One of her main concerns today is weight gain, has noted a increased appetite, also feeling tired. Wt log reviewed  Pain management, has chronic neck pain, well-controlled with hydrocodone at night. Hypertension, good compliance with medications.  Past Medical History  Diagnosis Date  . Anxiety and depression   . Hypertension   . Hypothyroidism   . Allergic rhinitis    Past Surgical History  Procedure Date  . Tubal ligation   . Tonsillectomy      Review of Systems Medication list reviewed, currently not taking anything for allergies. Denies anxiety or depression. Has hot flashes at baseline, denies cold intolerance. No lower extremity edema Besides the increase in appetite, her diet habits have not changed much yet she continue to gain weight.    Objective:   Physical Exam  General -- alert, well-developed, and well-nourished, BMI 25   Neck --no thyromegaly Lungs -- normal respiratory effort, no intercostal retractions, no accessory muscle use, and normal breath sounds.   Heart-- normal rate, regular rhythm, no murmur, and no gallop.   Abdomen--soft, non-tender, no distention, no masses, no HSM, no guarding, and no rigidity.  Extremities--no edema  Neurologic-- alert & oriented X3 and strength normal in all extremities. Psych-- Cognition and judgment appear intact. Alert and cooperative with normal attention span and concentration.  not anxious appearing and not depressed appearing.       Assessment & Plan:

## 2012-12-07 NOTE — Assessment & Plan Note (Addendum)
She has  gained 12 pounds in the last 2 years, fortunately her BMI is still healthy. No obvious explanation, she's not doing any emotional eating. Admits that she could exercise more. Plan: Labs to monitor her thyroid disease. more exercise Low-carb diet Patient wonders if she needs to take a medication to decrease her appetite, I don't think that is needed at this point. Reassess in few  months

## 2012-12-07 NOTE — Assessment & Plan Note (Signed)
Most of the pain is in the neck, uses hydrocodone at night and that keep her symptoms well-controlled. Plan: Patient will sign controlled substance agreement and have a urinary screening. Refill medications today and when necessary.Marland Kitchen

## 2012-12-07 NOTE — Assessment & Plan Note (Addendum)
Good compliance with medication, last TSH was slightly suppressed, we decrease the dose---> good compliance. Check a TSH

## 2012-12-09 ENCOUNTER — Encounter: Payer: Self-pay | Admitting: Internal Medicine

## 2012-12-13 ENCOUNTER — Telehealth: Payer: Self-pay | Admitting: *Deleted

## 2012-12-13 DIAGNOSIS — E039 Hypothyroidism, unspecified: Secondary | ICD-10-CM

## 2012-12-13 MED ORDER — LEVOTHYROXINE SODIUM 112 MCG PO TABS: 112.0000 ug | ORAL_TABLET | Freq: Every day | ORAL | Status: DC

## 2012-12-13 NOTE — Telephone Encounter (Signed)
Rx sent to pharmacy & lab orders entered.

## 2012-12-13 NOTE — Telephone Encounter (Signed)
Message copied by Nada Maclachlan on Thu Dec 13, 2012  9:27 AM ------      Message from: Sandra Owen      Created: Mon Dec 10, 2012  5:19 PM       Lillia Abed, call in new prescription for Synthroid 112 mcg 1 a day; #30, 3 RF, enter a TSH to be done in 6 weeks

## 2013-01-11 ENCOUNTER — Encounter: Payer: Self-pay | Admitting: Internal Medicine

## 2013-01-14 ENCOUNTER — Encounter: Payer: Self-pay | Admitting: Internal Medicine

## 2013-01-14 ENCOUNTER — Ambulatory Visit (INDEPENDENT_AMBULATORY_CARE_PROVIDER_SITE_OTHER): Payer: BC Managed Care – PPO | Admitting: Internal Medicine

## 2013-01-14 VITALS — BP 128/84 | HR 69 | Temp 98.5°F | Wt 147.0 lb

## 2013-01-14 DIAGNOSIS — E039 Hypothyroidism, unspecified: Secondary | ICD-10-CM

## 2013-01-14 DIAGNOSIS — J069 Acute upper respiratory infection, unspecified: Secondary | ICD-10-CM

## 2013-01-14 LAB — TSH: TSH: 0.13 u[IU]/mL — ABNORMAL LOW (ref 0.35–5.50)

## 2013-01-14 MED ORDER — FLUTICASONE PROPIONATE 50 MCG/ACT NA SUSP: 2.0000 | Freq: Every day | NASAL | Status: DC

## 2013-01-14 MED ORDER — AZITHROMYCIN 250 MG PO TABS: ORAL_TABLET | ORAL | Status: DC

## 2013-01-14 NOTE — Progress Notes (Signed)
  Subjective:    Patient ID: Sandra Owen, female    DOB: 08-Jan-1951, 62 y.o.   MRN: [id number]  HPI Acute visit Symptoms started early this month: On and off chest congestion, tightness anteriorly, cough with on and off for sputum production, watery eyes. On and off hoarseness. Taking Mucinex without much help.  Past Medical History  Diagnosis Date  . Anxiety and depression   . Hypertension   . Hypothyroidism   . Allergic rhinitis    Past Surgical History  Procedure Date  . Tubal ligation   . Tonsillectomy      Review of Systems Had fever with the onset of symptoms and  chills on and off. Had nausea and some diarrhea the onset of symptoms but that is largely resolved. No vomiting. Also on and off watery discharge and nasal congestion.    Objective:   Physical Exam  General -- alert, well-developed, NAD.   HEENT -- TMs normal, throat w/o redness, face symmetric and slt tender to palpation at both maxillary and frontal sinuses Lungs -- normal respiratory effort, no intercostal retractions, no accessory muscle use, and normal breath sounds.   Heart-- normal rate, regular rhythm, no murmur, and no gallop.   Extremities-- no pretibial edema bilaterally Psych-- Cognition and judgment appear intact. Alert and cooperative with normal attention span and concentration.  not anxious appearing and not depressed appearing.       Assessment & Plan:   Upper respiratory symptoms for almost 2 weeks, hard to say if this is allergies versus a atypical respiratory infection. Will treat the patient for both conditions with Zithromax, Claritin, Flonase. See instructions.

## 2013-01-14 NOTE — Patient Instructions (Addendum)
Rest, fluids , tylenol For cough, take Mucinex DM twice a day as needed  For congestion use the nasal spray daily and a claritin 10 mg OTC daily  until you feel better Take the antibiotic as prescribed  (zithromax) Call if no better in few days Call anytime if the symptoms are severe

## 2013-01-14 NOTE — Assessment & Plan Note (Signed)
Since she is here for an acute illness, will check her TSH

## 2013-01-15 MED ORDER — LEVOTHYROXINE SODIUM 100 MCG PO TABS: 100.0000 ug | ORAL_TABLET | Freq: Every day | ORAL | Status: DC

## 2013-01-15 NOTE — Addendum Note (Signed)
Addended by: Edwena Felty T on: 01/15/2013 04:36 PM   Modules accepted: Orders

## 2013-01-21 ENCOUNTER — Telehealth: Payer: Self-pay | Admitting: *Deleted

## 2013-01-21 MED ORDER — HYDROCODONE-ACETAMINOPHEN 5-325 MG PO TABS: 1.0000 | ORAL_TABLET | Freq: Every evening | ORAL | Status: DC | PRN

## 2013-01-21 NOTE — Telephone Encounter (Signed)
[facility] pharmacy called stating that they no longer make hydrocodone 5-500mg . OK to switch to hydrocodone 5-325mg ? Please advise.

## 2013-01-21 NOTE — Telephone Encounter (Signed)
Yes, same # and sig

## 2013-01-21 NOTE — Telephone Encounter (Signed)
Refill done.  

## 2013-02-02 ENCOUNTER — Other Ambulatory Visit: Payer: Self-pay

## 2013-03-05 ENCOUNTER — Telehealth: Payer: Self-pay | Admitting: Internal Medicine

## 2013-03-06 NOTE — Telephone Encounter (Signed)
Pt made aware rx is ready to be picked up.  

## 2013-03-06 NOTE — Telephone Encounter (Signed)
Ok to refill? Last OV 1.27.14 Last filled 2.3.14

## 2013-03-06 NOTE — Telephone Encounter (Signed)
done

## 2013-03-12 ENCOUNTER — Other Ambulatory Visit: Payer: Self-pay | Admitting: Internal Medicine

## 2013-03-12 NOTE — Telephone Encounter (Signed)
Refill done.  

## 2013-03-17 ENCOUNTER — Telehealth: Payer: Self-pay | Admitting: Internal Medicine

## 2013-03-17 NOTE — Telephone Encounter (Signed)
Please schedule labs, TSH dx hypothyroidism

## 2013-03-18 NOTE — Telephone Encounter (Signed)
Lmovm for pt to call office. °

## 2013-03-22 NOTE — Telephone Encounter (Signed)
Pt scheduled for TSH on 03/28/13.

## 2013-03-28 ENCOUNTER — Other Ambulatory Visit (INDEPENDENT_AMBULATORY_CARE_PROVIDER_SITE_OTHER): Payer: BC Managed Care – PPO

## 2013-03-28 DIAGNOSIS — E039 Hypothyroidism, unspecified: Secondary | ICD-10-CM

## 2013-03-28 LAB — TSH: TSH: 1.02 u[IU]/mL (ref 0.35–5.50)

## 2013-04-01 ENCOUNTER — Encounter: Payer: Self-pay | Admitting: *Deleted

## 2013-05-17 ENCOUNTER — Other Ambulatory Visit: Payer: Self-pay | Admitting: Obstetrics and Gynecology

## 2013-06-05 ENCOUNTER — Encounter (HOSPITAL_COMMUNITY): Payer: Self-pay | Admitting: Pharmacist

## 2013-06-11 ENCOUNTER — Encounter (HOSPITAL_COMMUNITY): Payer: Self-pay

## 2013-06-11 ENCOUNTER — Encounter (HOSPITAL_COMMUNITY)
Admission: RE | Admit: 2013-06-11 | Discharge: 2013-06-11 | Disposition: A | Discharge status: Home / Self Care | Payer: BC Managed Care – PPO | Source: Ambulatory Visit | Attending: Obstetrics and Gynecology | Admitting: Obstetrics and Gynecology

## 2013-06-11 DIAGNOSIS — Z01818 Encounter for other preprocedural examination: Secondary | ICD-10-CM | POA: Insufficient documentation

## 2013-06-11 DIAGNOSIS — Z01812 Encounter for preprocedural laboratory examination: Secondary | ICD-10-CM | POA: Insufficient documentation

## 2013-06-11 HISTORY — DX: Other specified postprocedural states: Z98.890

## 2013-06-11 HISTORY — DX: Nausea with vomiting, unspecified: R11.2

## 2013-06-11 HISTORY — DX: Other specified postprocedural states: R11.2

## 2013-06-11 LAB — BASIC METABOLIC PANEL
BUN: 20 mg/dL (ref 6–23)
CO2: 31 mEq/L (ref 19–32)
Calcium: 9.9 mg/dL (ref 8.4–10.5)
Chloride: 103 mEq/L (ref 96–112)
Creatinine, Ser: 0.8 mg/dL (ref 0.50–1.10)
GFR calc Af Amer: >90 mL/min (ref >90)
GFR calc non Af Amer: 79 mL/min — ABNORMAL LOW (ref >90)
Glucose, Bld: 91 mg/dL (ref 70–99)
Potassium: 3.5 mEq/L (ref 3.5–5.1)
Sodium: 142 mEq/L (ref 135–145)

## 2013-06-11 LAB — CBC
HCT: 38.1 % (ref 36.0–46.0)
Hemoglobin: 13.2 g/dL (ref 12.0–15.0)
MCH: 31.1 pg (ref 26.0–34.0)
MCHC: 34.6 g/dL (ref 30.0–36.0)
MCV: 89.9 fL (ref 78.0–100.0)
Platelets: 222 10*3/uL (ref 150–400)
RBC: 4.24 MIL/uL (ref 3.87–5.11)
RDW: 13 % (ref 11.5–15.5)
WBC: 4.5 10*3/uL (ref 4.0–10.5)

## 2013-06-11 LAB — SURGICAL PCR SCREEN
MRSA, PCR: NEGATIVE
Staphylococcus aureus: NEGATIVE

## 2013-06-11 NOTE — Patient Instructions (Addendum)
Your procedure is scheduled on:06/25/13  Enter through the Main Entrance at :6am Pick up desk phone and dial [phone] and inform us of your arrival.  Please call [phone] if you have any problems the morning of surgery.  Remember: Do not eat food or drink liquids, including water, after midnight:Monday  You may brush your teeth the morning of surgery.  Take these meds the morning of surgery with a sip of water:Synthroid, HCTZ  DO NOT wear jewelry, eye make-up, lipstick,body lotion, or dark fingernail polish.  (Polished toes are ok) You may wear deodorant.  If you are to be admitted after surgery, leave suitcase in car until your room has been assigned. Patients discharged on the day of surgery will not be allowed to drive home. Wear loose fitting, comfortable clothes for your ride home.

## 2013-06-13 ENCOUNTER — Ambulatory Visit (INDEPENDENT_AMBULATORY_CARE_PROVIDER_SITE_OTHER): Payer: BC Managed Care – PPO | Admitting: Family Medicine

## 2013-06-13 ENCOUNTER — Encounter: Payer: Self-pay | Admitting: Family Medicine

## 2013-06-13 VITALS — BP 140/84 | HR 85 | Temp 99.0°F | Wt 147.0 lb

## 2013-06-13 DIAGNOSIS — N390 Urinary tract infection, site not specified: Secondary | ICD-10-CM

## 2013-06-13 LAB — POCT URINALYSIS DIPSTICK
Bilirubin, UA: NEGATIVE
Glucose, UA: NEGATIVE
Nitrite, UA: POSITIVE
Spec Grav, UA: 1.02
Urobilinogen, UA: 0.2
pH, UA: 5

## 2013-06-13 MED ORDER — PHENAZOPYRIDINE HCL 200 MG PO TABS: 200.0000 mg | ORAL_TABLET | Freq: Three times a day (TID) | ORAL | Status: DC | PRN

## 2013-06-13 MED ORDER — CIPROFLOXACIN HCL 500 MG PO TABS: 500.0000 mg | ORAL_TABLET | Freq: Two times a day (BID) | ORAL | Status: AC

## 2013-06-13 NOTE — Progress Notes (Signed)
  Subjective:    Sandra Owen is a 62 y.o. female who complains of dysuria, frequency and hematuria. She has had symptoms for 1 day. Patient also complains of back pain. Patient denies cough, fever, headache, rhinitis, sorethroat, stomach ache and vaginal discharge. Patient does not have a history of recurrent UTI. Patient does not have a history of pyelonephritis.   The following portions of the patient's history were reviewed and updated as appropriate: allergies, current medications, past family history, past medical history, past social history, past surgical history and problem list.  Review of Systems Pertinent items are noted in HPI.    Objective:    BP 140/84  Pulse 85  Temp(Src) 99 F (37.2 C) (Oral)  Wt 147 lb (66.679 kg)  BMI 25.22 kg/m2  SpO2 95% General appearance: alert, cooperative, appears stated age and no distress Abdomen: abnormal findings:  mild tenderness in the lower abdomen  Laboratory:  Urine dipstick: large for hemoglobin, trace for ketones, 3+ for leukocyte esterase, positive for nitrites and trace for protein.   Micro exam: not done.    Assessment:    Acute cystitis and UTI     Plan:    Medications: ciprofloxacin. Maintain adequate hydration. Follow up if symptoms not improving, and as needed.

## 2013-06-13 NOTE — Patient Instructions (Addendum)
Urinary Tract Infection  Urinary tract infections (UTIs) can develop anywhere along your urinary tract. Your urinary tract is your body's drainage system for removing wastes and extra water. Your urinary tract includes two kidneys, two ureters, a bladder, and a urethra. Your kidneys are a pair of bean-shaped organs. Each kidney is about the size of your fist. They are located below your ribs, one on each side of your spine.  CAUSES  Infections are caused by microbes, which are microscopic organisms, including fungi, viruses, and bacteria. These organisms are so small that they can only be seen through a microscope. Bacteria are the microbes that most commonly cause UTIs.  SYMPTOMS   Symptoms of UTIs may vary by age and gender of the patient and by the location of the infection. Symptoms in young women typically include a frequent and intense urge to urinate and a painful, burning feeling in the bladder or urethra during urination. Older women and men are more likely to be tired, shaky, and weak and have muscle aches and abdominal pain. A fever may mean the infection is in your kidneys. Other symptoms of a kidney infection include pain in your back or sides below the ribs, nausea, and vomiting.  DIAGNOSIS  To diagnose a UTI, your caregiver will ask you about your symptoms. Your caregiver also will ask to provide a urine sample. The urine sample will be tested for bacteria and white blood cells. White blood cells are made by your body to help fight infection.  TREATMENT   Typically, UTIs can be treated with medication. Because most UTIs are caused by a bacterial infection, they usually can be treated with the use of antibiotics. The choice of antibiotic and length of treatment depend on your symptoms and the type of bacteria causing your infection.  HOME CARE INSTRUCTIONS   If you were prescribed antibiotics, take them exactly as your caregiver instructs you. Finish the medication even if you feel better after you  have only taken some of the medication.   Drink enough water and fluids to keep your urine clear or pale yellow.   Avoid caffeine, tea, and carbonated beverages. They tend to irritate your bladder.   Empty your bladder often. Avoid holding urine for long periods of time.   Empty your bladder before and after sexual intercourse.   After a bowel movement, women should cleanse from front to back. Use each tissue only once.  SEEK MEDICAL CARE IF:    You have back pain.   You develop a fever.   Your symptoms do not begin to resolve within 3 days.  SEEK IMMEDIATE MEDICAL CARE IF:    You have severe back pain or lower abdominal pain.   You develop chills.   You have nausea or vomiting.   You have continued burning or discomfort with urination.  MAKE SURE YOU:    Understand these instructions.   Will watch your condition.   Will get help right away if you are not doing well or get worse.  Document Released: 09/14/2005 Document Revised: 06/05/2012 Document Reviewed: 01/13/2012  [employer] Patient Information 2014 [employer]

## 2013-06-16 LAB — URINE CULTURE: Colony Count: >=100000

## 2013-06-24 ENCOUNTER — Encounter (HOSPITAL_COMMUNITY): Payer: Self-pay | Admitting: Anesthesiology

## 2013-06-24 NOTE — H&P (Signed)
Subjective:    Patient is a 62 y.o. female scheduled for vaginal hysterectomy and possible anterior and posterior colporrhaphy for symptomatic uterine prolapse.  She reports pelvic pressure and pain and the sensation that something is protruding from vagina. On exam she has grade 2 prolapse as well as a cystocele and she is being brought to the hospital to correct these pelvic floor defects    Menstrual History: OB History   Grav Para Term Preterm Abortions TAB SAB Ect Mult Living                   No LMP recorded. Patient is postmenopausal.       Review of Systems  Respiratory: no cough or shortness of breath GI: no nausea, vomiting, diarrhea or constipation GU: no dysuria, frequency or urgency Gyn: no bleeding, discharge or itch. Positive pressure  Objective:    There were no vitals taken for this visit.  General:   alert and cooperative  Skin:   normal and no rash or abnormalities  HEENT:  PERRLA  Lungs:   clear to auscultation bilaterally  Heart:   regular rate and rhythm, S1, S2 normal, no murmur, click, rub or gallop  Breasts:   normal without suspicious masses, skin or nipple changes or axillary nodes  Abdomen:  soft, non-tender; bowel sounds normal; no masses,  no organomegaly  Pelvis:  Vulva and vagina appear normal. Bimanual exam reveals normal uterus and adnexa. The uterus is normal size with second degree prolapse and there is a second degree cystocele.    Assessment:       I Impression: uterine prolapse with cystocele   Plan:    Counseling: Procedure, risks, reasons, benefits and complications (including injury to bowel, bladder, major blood vessel, ureter, bleeding, possibility of transfusion, infection, or fistula formation) reviewed in detail. Consent signed. Preop testing ordered. Instructions reviewed, including NPO after midnight.

## 2013-06-24 NOTE — Anesthesia Preprocedure Evaluation (Addendum)
Anesthesia Evaluation  Patient identified by MRN, date of birth, ID band Patient awake    Reviewed: Allergy & Precautions, H&P , NPO status , Patient's Chart, lab work & pertinent test results  History of Anesthesia Complications (+) PONV  Airway Mallampati: I TM Distance: >3 FB Neck ROM: Full    Dental no notable dental hx. (+) Teeth Intact   Pulmonary neg pulmonary ROS,  breath sounds clear to auscultation  Pulmonary exam normal       Cardiovascular hypertension, Pt. on medications Rhythm:Regular Rate:Normal     Neuro/Psych  Headaches, PSYCHIATRIC DISORDERS Anxiety Depression    GI/Hepatic negative GI ROS, Neg liver ROS,   Endo/Other  Hypothyroidism   Renal/GU Hx/o nephrolithiasis  negative genitourinary   Musculoskeletal  (+) Arthritis -, Osteoarthritis,    Abdominal Normal abdominal exam  (+)   Peds  Hematology negative hematology ROS (+)   Anesthesia Other Findings   Reproductive/Obstetrics Cystocele Rectocele Uterine Prolapse                          Anesthesia Physical Anesthesia Plan  ASA: II  Anesthesia Plan: Spinal and Combined Spinal and Epidural   Post-op Pain Management:    Induction:   Airway Management Planned: Natural Airway and Nasal Cannula  Additional Equipment:   Intra-op Plan:   Post-operative Plan:   Informed Consent: I have reviewed the patients History and Physical, chart, labs and discussed the procedure including the risks, benefits and alternatives for the proposed anesthesia with the patient or authorized representative who has indicated his/her understanding and acceptance.   Dental advisory given  Plan Discussed with: CRNA, Anesthesiologist and Surgeon  Anesthesia Plan Comments:        Anesthesia Quick Evaluation

## 2013-06-25 ENCOUNTER — Ambulatory Visit (HOSPITAL_COMMUNITY): Payer: BC Managed Care – PPO | Admitting: Anesthesiology

## 2013-06-25 ENCOUNTER — Encounter (HOSPITAL_COMMUNITY): Payer: Self-pay | Admitting: Anesthesiology

## 2013-06-25 ENCOUNTER — Encounter (HOSPITAL_COMMUNITY)
Admission: RE | Disposition: A | Discharge status: Home / Self Care | Payer: Self-pay | Source: Ambulatory Visit | Attending: Obstetrics and Gynecology

## 2013-06-25 ENCOUNTER — Observation Stay (HOSPITAL_COMMUNITY)
Admission: RE | Admit: 2013-06-25 | Discharge: 2013-06-27 | Disposition: A | Discharge status: Home / Self Care | Payer: BC Managed Care – PPO | Source: Ambulatory Visit | Attending: Obstetrics and Gynecology | Admitting: Obstetrics and Gynecology

## 2013-06-25 ENCOUNTER — Encounter (HOSPITAL_COMMUNITY): Payer: Self-pay | Admitting: *Deleted

## 2013-06-25 DIAGNOSIS — N840 Polyp of corpus uteri: Secondary | ICD-10-CM | POA: Insufficient documentation

## 2013-06-25 DIAGNOSIS — N812 Incomplete uterovaginal prolapse: Principal | ICD-10-CM | POA: Insufficient documentation

## 2013-06-25 DIAGNOSIS — N814 Uterovaginal prolapse, unspecified: Secondary | ICD-10-CM

## 2013-06-25 DIAGNOSIS — R5381 Other malaise: Secondary | ICD-10-CM | POA: Insufficient documentation

## 2013-06-25 DIAGNOSIS — D252 Subserosal leiomyoma of uterus: Secondary | ICD-10-CM | POA: Insufficient documentation

## 2013-06-25 DIAGNOSIS — R209 Unspecified disturbances of skin sensation: Secondary | ICD-10-CM | POA: Insufficient documentation

## 2013-06-25 DIAGNOSIS — N949 Unspecified condition associated with female genital organs and menstrual cycle: Secondary | ICD-10-CM | POA: Insufficient documentation

## 2013-06-25 HISTORY — PX: ANTERIOR AND POSTERIOR REPAIR: SHX5121

## 2013-06-25 HISTORY — PX: VAGINAL HYSTERECTOMY: SHX2639

## 2013-06-25 LAB — HEMOGLOBIN: Hemoglobin: 13.6 g/dL (ref 12.0–15.0)

## 2013-06-25 LAB — PROTIME-INR
INR: 0.99 (ref 0.00–1.49)
Prothrombin Time: 12.9 seconds (ref 11.6–15.2)

## 2013-06-25 LAB — APTT: aPTT: 26 seconds (ref 24–37)

## 2013-06-25 SURGERY — HYSTERECTOMY, VAGINAL
Anesthesia: Spinal | Wound class: Clean Contaminated

## 2013-06-25 MED ORDER — INDIGOTINDISULFONATE SODIUM 8 MG/ML IJ SOLN
INTRAMUSCULAR | Status: DC | PRN
  Administered 2013-06-25: 40 mg via INTRAVENOUS

## 2013-06-25 MED ORDER — SCOPOLAMINE 1 MG/3DAYS TD PT72
1.0000 | MEDICATED_PATCH | TRANSDERMAL | Status: DC
  Administered 2013-06-25: 1.5 mg via TRANSDERMAL

## 2013-06-25 MED ORDER — MEPERIDINE HCL 25 MG/ML IJ SOLN: 6.2500 mg | INTRAMUSCULAR | Status: DC | PRN

## 2013-06-25 MED ORDER — BUPIVACAINE IN DEXTROSE 0.75-8.25 % IT SOLN
INTRATHECAL | Status: DC | PRN
  Administered 2013-06-25: 1.5 mL via INTRATHECAL

## 2013-06-25 MED ORDER — MENTHOL 3 MG MT LOZG: 1.0000 | LOZENGE | OROMUCOSAL | Status: DC | PRN

## 2013-06-25 MED ORDER — PROPOFOL 10 MG/ML IV BOLUS
INTRAVENOUS | Status: DC | PRN
  Administered 2013-06-25 (×2): 10 mg via INTRAVENOUS
  Administered 2013-06-25: 20 mg via INTRAVENOUS
  Administered 2013-06-25 (×2): 10 mg via INTRAVENOUS
  Administered 2013-06-25 (×2): 20 mg via INTRAVENOUS
  Administered 2013-06-25 (×2): 10 mg via INTRAVENOUS
  Administered 2013-06-25: 20 mg via INTRAVENOUS
  Administered 2013-06-25 (×2): 10 mg via INTRAVENOUS
  Administered 2013-06-25 (×2): 20 mg via INTRAVENOUS
  Administered 2013-06-25: 10 mg via INTRAVENOUS
  Administered 2013-06-25 (×3): 20 mg via INTRAVENOUS
  Administered 2013-06-25: 10 mg via INTRAVENOUS

## 2013-06-25 MED ORDER — SCOPOLAMINE 1 MG/3DAYS TD PT72
1.0000 | MEDICATED_PATCH | Freq: Once | TRANSDERMAL | Status: DC
  Filled 2013-06-25: qty 1

## 2013-06-25 MED ORDER — CEFAZOLIN SODIUM-DEXTROSE 2-3 GM-% IV SOLR
INTRAVENOUS | Status: AC
  Filled 2013-06-25: qty 50

## 2013-06-25 MED ORDER — FENTANYL CITRATE 0.05 MG/ML IJ SOLN
INTRAMUSCULAR | Status: AC
  Filled 2013-06-25: qty 5

## 2013-06-25 MED ORDER — NEOSTIGMINE METHYLSULFATE 1 MG/ML IJ SOLN
INTRAMUSCULAR | Status: AC
  Filled 2013-06-25: qty 1

## 2013-06-25 MED ORDER — LIDOCAINE-EPINEPHRINE 1 %-1:100000 IJ SOLN
INTRAMUSCULAR | Status: DC | PRN
  Administered 2013-06-25: 16 mL

## 2013-06-25 MED ORDER — MORPHINE SULFATE 0.5 MG/ML IJ SOLN
INTRAMUSCULAR | Status: AC
  Filled 2013-06-25: qty 10

## 2013-06-25 MED ORDER — PROPOFOL 10 MG/ML IV EMUL
INTRAVENOUS | Status: AC
  Filled 2013-06-25: qty 40

## 2013-06-25 MED ORDER — DIPHENHYDRAMINE HCL 25 MG PO CAPS: 25.0000 mg | ORAL_CAPSULE | ORAL | Status: DC | PRN

## 2013-06-25 MED ORDER — ONDANSETRON HCL 4 MG/2ML IJ SOLN
INTRAMUSCULAR | Status: DC | PRN
  Administered 2013-06-25: 4 mg via INTRAVENOUS

## 2013-06-25 MED ORDER — FENTANYL CITRATE 0.05 MG/ML IJ SOLN
INTRAMUSCULAR | Status: DC | PRN
  Administered 2013-06-25: 15 ug via INTRATHECAL

## 2013-06-25 MED ORDER — ONDANSETRON HCL 4 MG/2ML IJ SOLN
INTRAMUSCULAR | Status: AC
  Filled 2013-06-25: qty 2

## 2013-06-25 MED ORDER — LACTATED RINGERS IV SOLN
INTRAVENOUS | Status: DC
  Administered 2013-06-25: 1000 mL via INTRAVENOUS
  Administered 2013-06-25: 11:00:00 via INTRAVENOUS

## 2013-06-25 MED ORDER — IBUPROFEN 600 MG PO TABS
600.0000 mg | ORAL_TABLET | Freq: Four times a day (QID) | ORAL | Status: DC | PRN
  Administered 2013-06-26 (×2): 600 mg via ORAL
  Filled 2013-06-25 (×2): qty 1

## 2013-06-25 MED ORDER — SODIUM CHLORIDE 0.9 % IJ SOLN: 3.0000 mL | INTRAMUSCULAR | Status: DC | PRN

## 2013-06-25 MED ORDER — ACETAMINOPHEN 160 MG/5ML PO SOLN
975.0000 mg | Freq: Once | ORAL | Status: AC
  Administered 2013-06-25: 975 mg via ORAL

## 2013-06-25 MED ORDER — PROPOFOL 10 MG/ML IV EMUL
INTRAVENOUS | Status: AC
  Filled 2013-06-25: qty 20

## 2013-06-25 MED ORDER — LIDOCAINE HCL (CARDIAC) 20 MG/ML IV SOLN
INTRAVENOUS | Status: AC
  Filled 2013-06-25: qty 5

## 2013-06-25 MED ORDER — GLYCOPYRROLATE 0.2 MG/ML IJ SOLN
INTRAMUSCULAR | Status: AC
  Filled 2013-06-25: qty 3

## 2013-06-25 MED ORDER — NALBUPHINE SYRINGE 5 MG/0.5 ML
INJECTION | INTRAMUSCULAR | Status: AC
  Administered 2013-06-25: 10 mg via SUBCUTANEOUS
  Filled 2013-06-25: qty 1

## 2013-06-25 MED ORDER — SODIUM CHLORIDE 0.9 % IV SOLN
INTRAVENOUS | Status: DC
  Administered 2013-06-25 (×3): via EPIDURAL
  Administered 2013-06-26: 14 mL/h via EPIDURAL
  Filled 2013-06-25 (×10): qty 20

## 2013-06-25 MED ORDER — LACTATED RINGERS IV SOLN
INTRAVENOUS | Status: DC
  Administered 2013-06-25 – 2013-06-26 (×3): via INTRAVENOUS

## 2013-06-25 MED ORDER — DEXAMETHASONE SODIUM PHOSPHATE 10 MG/ML IJ SOLN
INTRAMUSCULAR | Status: DC | PRN
  Administered 2013-06-25: 10 mg via INTRAVENOUS

## 2013-06-25 MED ORDER — METOCLOPRAMIDE HCL 5 MG/ML IJ SOLN: 10.0000 mg | Freq: Once | INTRAMUSCULAR | Status: DC | PRN

## 2013-06-25 MED ORDER — NALOXONE HCL 0.4 MG/ML IJ SOLN: 0.4000 mg | INTRAMUSCULAR | Status: DC | PRN

## 2013-06-25 MED ORDER — ONDANSETRON HCL 4 MG/2ML IJ SOLN: 4.0000 mg | Freq: Three times a day (TID) | INTRAMUSCULAR | Status: DC | PRN

## 2013-06-25 MED ORDER — KETOROLAC TROMETHAMINE 60 MG/2ML IM SOLN
60.0000 mg | Freq: Once | INTRAMUSCULAR | Status: AC | PRN
  Filled 2013-06-25: qty 2

## 2013-06-25 MED ORDER — KETOROLAC TROMETHAMINE 30 MG/ML IJ SOLN
INTRAMUSCULAR | Status: AC
  Administered 2013-06-25: 30 mg via INTRAVENOUS
  Filled 2013-06-25: qty 1

## 2013-06-25 MED ORDER — KETOROLAC TROMETHAMINE 30 MG/ML IJ SOLN: 30.0000 mg | Freq: Four times a day (QID) | INTRAMUSCULAR | Status: AC | PRN

## 2013-06-25 MED ORDER — NALBUPHINE HCL 10 MG/ML IJ SOLN
5.0000 mg | INTRAMUSCULAR | Status: DC | PRN
  Filled 2013-06-25: qty 1

## 2013-06-25 MED ORDER — ACETAMINOPHEN 160 MG/5ML PO SOLN
ORAL | Status: AC
  Filled 2013-06-25: qty 40.6

## 2013-06-25 MED ORDER — DIPHENHYDRAMINE HCL 50 MG/ML IJ SOLN: 25.0000 mg | INTRAMUSCULAR | Status: DC | PRN

## 2013-06-25 MED ORDER — KETOROLAC TROMETHAMINE 30 MG/ML IJ SOLN
INTRAMUSCULAR | Status: AC
  Filled 2013-06-25: qty 1

## 2013-06-25 MED ORDER — HYDROMORPHONE HCL PF 1 MG/ML IJ SOLN
0.2500 mg | INTRAMUSCULAR | Status: DC | PRN
  Administered 2013-06-25: 0.5 mg via INTRAVENOUS

## 2013-06-25 MED ORDER — METOCLOPRAMIDE HCL 5 MG/ML IJ SOLN: 10.0000 mg | Freq: Three times a day (TID) | INTRAMUSCULAR | Status: DC | PRN

## 2013-06-25 MED ORDER — MIDAZOLAM HCL 2 MG/2ML IJ SOLN
INTRAMUSCULAR | Status: AC
  Filled 2013-06-25: qty 2

## 2013-06-25 MED ORDER — HYDROMORPHONE HCL PF 1 MG/ML IJ SOLN
INTRAMUSCULAR | Status: AC
  Administered 2013-06-25: 0.5 mg via INTRAVENOUS
  Filled 2013-06-25: qty 1

## 2013-06-25 MED ORDER — DEXAMETHASONE SODIUM PHOSPHATE 10 MG/ML IJ SOLN
INTRAMUSCULAR | Status: AC
  Filled 2013-06-25: qty 1

## 2013-06-25 MED ORDER — ROCURONIUM BROMIDE 50 MG/5ML IV SOLN
INTRAVENOUS | Status: AC
  Filled 2013-06-25: qty 1

## 2013-06-25 MED ORDER — MIDAZOLAM HCL 5 MG/5ML IJ SOLN
INTRAMUSCULAR | Status: DC | PRN
  Administered 2013-06-25: 2 mg via INTRAVENOUS

## 2013-06-25 MED ORDER — LACTATED RINGERS IV SOLN
INTRAVENOUS | Status: DC | PRN
  Administered 2013-06-25 (×2): via INTRAVENOUS

## 2013-06-25 MED ORDER — SCOPOLAMINE 1 MG/3DAYS TD PT72
MEDICATED_PATCH | TRANSDERMAL | Status: AC
  Filled 2013-06-25: qty 1

## 2013-06-25 MED ORDER — NALOXONE HCL 1 MG/ML IJ SOLN
1.0000 ug/kg/h | INTRAVENOUS | Status: DC | PRN
  Filled 2013-06-25: qty 2

## 2013-06-25 MED ORDER — LACTATED RINGERS IV SOLN
INTRAVENOUS | Status: DC
  Administered 2013-06-25: 16:00:00 via INTRAVENOUS

## 2013-06-25 MED ORDER — 0.9 % SODIUM CHLORIDE (POUR BTL) OPTIME
TOPICAL | Status: DC | PRN
  Administered 2013-06-25: 1000 mL

## 2013-06-25 MED ORDER — LEVOTHYROXINE SODIUM 100 MCG PO TABS
100.0000 ug | ORAL_TABLET | Freq: Every day | ORAL | Status: DC
  Administered 2013-06-26 – 2013-06-27 (×2): 100 ug via ORAL
  Filled 2013-06-25 (×2): qty 1

## 2013-06-25 MED ORDER — MORPHINE SULFATE (PF) 0.5 MG/ML IJ SOLN
INTRAMUSCULAR | Status: DC | PRN
  Administered 2013-06-25: .1 mg via INTRATHECAL

## 2013-06-25 MED ORDER — DIPHENHYDRAMINE HCL 50 MG/ML IJ SOLN
12.5000 mg | INTRAMUSCULAR | Status: DC | PRN
  Administered 2013-06-25: 12.5 mg via INTRAVENOUS
  Filled 2013-06-25: qty 1

## 2013-06-25 MED ORDER — LIDOCAINE HCL (CARDIAC) 20 MG/ML IV SOLN
INTRAVENOUS | Status: DC | PRN
  Administered 2013-06-25: 20 mg via INTRAVENOUS

## 2013-06-25 MED ORDER — CEFAZOLIN SODIUM-DEXTROSE 2-3 GM-% IV SOLR
2.0000 g | INTRAVENOUS | Status: AC
  Administered 2013-06-25: 2 g via INTRAVENOUS

## 2013-06-25 MED ORDER — BUPIVACAINE IN DEXTROSE 0.75-8.25 % IT SOLN
INTRATHECAL | Status: AC
  Filled 2013-06-25: qty 2

## 2013-06-25 SURGICAL SUPPLY — 36 items
BLADE SURG 15 STRL LF C SS BP (BLADE) ×1 IMPLANT
BLADE SURG 15 STRL SS (BLADE) ×2
CANISTER SUCTION 2500CC (MISCELLANEOUS) ×2 IMPLANT
CLOTH BEACON ORANGE TIMEOUT ST (SAFETY) ×2 IMPLANT
CONT PATH 16OZ SNAP LID 3702 (MISCELLANEOUS) IMPLANT
DECANTER SPIKE VIAL GLASS SM (MISCELLANEOUS) IMPLANT
DRESSING TELFA 8X3 (GAUZE/BANDAGES/DRESSINGS) ×2 IMPLANT
GAUZE PACKING IODOFORM 1 (PACKING) ×1 IMPLANT
GLOVE BIO SURGEON STRL SZ7.5 (GLOVE) ×2 IMPLANT
GLOVE BIOGEL PI IND STRL 6 (GLOVE) ×1 IMPLANT
GLOVE BIOGEL PI IND STRL 6.5 (GLOVE) ×1 IMPLANT
GLOVE BIOGEL PI IND STRL 7.5 (GLOVE) ×1 IMPLANT
GLOVE BIOGEL PI INDICATOR 6 (GLOVE) ×1
GLOVE BIOGEL PI INDICATOR 6.5 (GLOVE) ×1
GLOVE BIOGEL PI INDICATOR 7.5 (GLOVE) ×1
GOWN PREVENTION PLUS XXLARGE (GOWN DISPOSABLE) ×2 IMPLANT
GOWN STRL REIN XL XLG (GOWN DISPOSABLE) ×8 IMPLANT
NDL SPNL 22GX3.5 QUINCKE BK (NEEDLE) IMPLANT
NEEDLE HYPO 22GX1.5 SAFETY (NEEDLE) ×2 IMPLANT
NEEDLE MAYO .5 CIRCLE (NEEDLE) IMPLANT
NEEDLE SPNL 22GX3.5 QUINCKE BK (NEEDLE) IMPLANT
NS IRRIG 1000ML POUR BTL (IV SOLUTION) ×2 IMPLANT
PACK VAGINAL WOMENS (CUSTOM PROCEDURE TRAY) ×2 IMPLANT
PAD OB MATERNITY 4.3X12.25 (PERSONAL CARE ITEMS) ×2 IMPLANT
SUT VIC AB 0 CT1 18XCR BRD8 (SUTURE) ×2 IMPLANT
SUT VIC AB 0 CT1 27 (SUTURE) ×4
SUT VIC AB 0 CT1 27XBRD ANBCTR (SUTURE) ×2 IMPLANT
SUT VIC AB 0 CT1 8-18 (SUTURE) ×4
SUT VIC AB 2-0 CT1 27 (SUTURE) ×6
SUT VIC AB 2-0 CT1 TAPERPNT 27 (SUTURE) ×3 IMPLANT
SUT VIC AB 2-0 SH 27 (SUTURE) ×6
SUT VIC AB 2-0 SH 27XBRD (SUTURE) ×3 IMPLANT
SUT VICRYL 1 TIES 12X18 (SUTURE) ×2 IMPLANT
TOWEL OR 17X24 6PK STRL BLUE (TOWEL DISPOSABLE) ×4 IMPLANT
TRAY FOLEY CATH 14FR (SET/KITS/TRAYS/PACK) ×2 IMPLANT
WATER STERILE IRR 1000ML POUR (IV SOLUTION) ×2 IMPLANT

## 2013-06-25 NOTE — Op Note (Signed)
Sandra Owen, Sandra Owen                ACCOUNT NO.:  [id number]  MEDICAL RECORD NO.:  [id number]  LOCATION:  9302                          FACILITY:  [facility]  PHYSICIAN:  Miguel Aschoff, M.D.       DATE OF BIRTH:  1951/11/08  DATE OF PROCEDURE:  06/25/2013 DATE OF DISCHARGE:                              OPERATIVE REPORT   PREOPERATIVE DIAGNOSES:  Pelvic relaxation with uterine descensus, cystocele, and rectocele.  POSTOPERATIVE DIAGNOSES:  Pelvic relaxation with uterine descensus, cystocele, and rectocele.  PROCEDURE:  Total vaginal hysterectomy with anterior and posterior colporrhaphy.  SURGEON:  Miguel Aschoff, MD  ASSISTANT:  Luvenia Redden, M.D.  ANESTHESIA:  General.  COMPLICATIONS:  None.  JUSTIFICATION:  The patient is a 62 year old white female, who presented to the office complaining of pelvic pressure and pain and some bulging through the vagina.  On examination, she had significant uterine prolapse +2 as well as a large cystocele visible at the introitus and a small rectocele.  Because of these pelvic floor defects and a desire to have these corrected, she is being brought to the operating room at this time to undergo total vaginal hysterectomy and anterior posterior colporrhaphy.  The risks and benefits of procedure have been discussed with the patient.  An informed consent has been obtained.  PROCEDURE:  The patient was taken to the operating room, placed in the supine position.  Spinal and epidural anesthesia was administered without difficulty.  After satisfactory level of anesthesia was achieved, the patient was placed in the dorsal lithotomy position, prepped and draped in the usual sterile fashion.  Foley catheter was inserted.  At this point, a speculum was placed in the vaginal vault. The cervix was grasped with a tenaculum and then the cervix was injected with total of 7 mL of 1% Xylocaine with epinephrine for hemostasis.  The cervical mucosa was then circumscribed  and dissected anteriorly and posteriorly until the peritoneal reflections were found.  The peritoneum was entered posteriorly.  At this point, curved Heaney clamps were used to clamp the uterosacral ligaments.  These pedicles were cut and suture ligated using suture ligatures of 0 Vicryl.  Additional bites were taken of the cardinal ligaments using curved Heaney clamps and again all sutures were suture ligatures of 0 Vicryl.  Additional bites were taken along the paracervical fascia with curved Heaney clamps.  The uterine vessels were then found, clamped, cut, and suture ligated using suture ligatures of 0 Vicryl and then was possible to enter anteriorly into the peritoneal cavity.  At this point, the residual structures in the broad ligament including the fallopian tube and round ligament were clamped with Heaney clamps.  These pedicles were cut, suture ligated using suture ligatures of 0 Vicryl and then free ties of 0 Vicryl.  The specimen consisting of the cervix and uterus was excised without difficulty.  At this point, the posterior vaginal cuff was run using running interlocking 0 Vicryl suture.  All pedicles at this point were inspected and found to be hemostatically secure and at this point the peritoneum was closed using a pursestring suture of 0 Vicryl.  Attention was then directed to the anterior vaginal wall.  It was injected again with Xylocaine with epinephrine, 5 mL were used, and the mucosa was incised in the midline up to within 3 cm of the urethra and the paravesical fascia was separated from the overlying vaginal mucosa until the cystocele had been outlined.  The cystocele was then reduced using 0 pursestring sutures of 0 Vicryl, then reinforced by suturing the paravesical fascia across the cystocele repair using interrupted 0 Vicryl suture.  The excess mucosa was then trimmed and then reapproximated using running interlocking 2-0 Vicryl suture. Uterosacral ligaments  were then sutured to each other to provide support to the apex of the vaginal cuff.  The cuff was then completely closed. Attention was then directed to the posterior vaginal wall.  An ellipse of perineal skin was then incised and elevated and then dissection continued into the posterior vaginal wall for approximately 6 cm.  Once this was done, the rectocele was free from the overlying vaginal mucosa. With rectocele being outlined, it was reduced using interrupted 0 Vicryl sutures.  Once it was reduced, it was further reinforced by taking the pararectal fascia and plicating this across the rectocele repair.  The excess vaginal mucosa was then trimmed and then the posterior vaginal wall was closed using running interlocking 2-0 Vicryl suture.  The perineal body was then reconstructed using a crown suture of 0 Vicryl and the perineum was then reconstructed using subcutaneous 2-0 Vicryl suture followed by subcuticular 2-0 Vicryl suture.  At this point, a final inspection was made for hemostasis.  Hemostasis appeared to be excellent.  An Iodoform pack was then placed into the vaginal vault. The patient taken out of lithotomy position and brought to recovery room in satisfactory condition.  The estimated blood loss was approximately 50 mL.  The patient tolerated the procedure and went to the recovery room in satisfactory condition.     Miguel Aschoff, M.D.     AR/MEDQ  D:  06/25/2013  T:  06/25/2013  Job:  [id number]

## 2013-06-25 NOTE — Brief Op Note (Signed)
06/25/2013  8:56 AM  PATIENT:  Sandra Owen  62 y.o. female  PRE-OPERATIVE DIAGNOSIS:  Prolapse; Cystocele; Rectocele  POST-OPERATIVE DIAGNOSIS:  Prolapse; Cystocele; Rectocele  PROCEDURE:  Procedure(s): HYSTERECTOMY VAGINAL (N/A) ANTERIOR (CYSTOCELE) AND POSTERIOR REPAIR (RECTOCELE) (N/A)  SURGEON:  Surgeon(s) and Role:    * Miguel Aschoff, MD - Primary    * W Lodema Hong, MD - Assisting  PHYSICIAN ASSISTANT:   ANESTHESIA:   epidural  EBL:  Total I/O In: 1400 [I.V.:1400] Out: 150 [Urine:150]  BLOOD ADMINISTERED:none  DRAINS: Urinary Catheter (Foley)   LOCAL MEDICATIONS USED:  LIDOCAINE   SPECIMEN:  Source of Specimen:  cervix and uterus  DISPOSITION OF SPECIMEN:  PATHOLOGY  COUNTS:  YES  TOURNIQUET:  * No tourniquets in log *  DICTATION: .Other Dictation: Dictation Number [id number]  PLAN OF CARE: Admit for overnight observation  PATIENT DISPOSITION:  PACU - hemodynamically stable.   Delay start of Pharmacological VTE agent (>24hrs) due to surgical blood loss or risk of bleeding: PAS hose applied

## 2013-06-25 NOTE — Anesthesia Procedure Notes (Signed)
Spinal  Patient location during procedure: OR Start time: 06/25/2013 7:25 AM Staffing Performed by: anesthesiologist  Preanesthetic Checklist Completed: patient identified, site marked, surgical consent, pre-op evaluation, timeout performed, IV checked, risks and benefits discussed and monitors and equipment checked Spinal Block Patient position: sitting Prep: site prepped and draped and DuraPrep Patient monitoring: heart rate, cardiac monitor, continuous pulse ox and blood pressure Approach: midline Location: L3-4 Injection technique: single-shot Needle Needle type: Sprotte and Tuohy  Needle gauge: 24 G Needle length: 12.7 cm Needle insertion depth: 5 cm Catheter type: closed end flexible Catheter size: 19 g Catheter at skin depth: 10 cm Assessment Sensory level: T4 Additional Notes LOR to air at 10 cm, pencan via tuohy with immediate return of clear free flow CSF.  SAB dose given, pencan removed, 2 ml saline flushed via tuohy.  Epidural catheter passed, secured at 10 cm at skin.  Epidural catheter NOT TESTED.  No paresthesia.  Patient tolerated procedure well with no apparent complications.  Jasmine December, MD

## 2013-06-25 NOTE — Anesthesia Postprocedure Evaluation (Signed)
  Anesthesia Post-op Note  Anesthesia Post Note  Patient: Sandra Owen  Procedure(s) Performed: Procedure(s) (LRB): HYSTERECTOMY VAGINAL (N/A) ANTERIOR (CYSTOCELE) AND POSTERIOR REPAIR (RECTOCELE) (N/A)  Anesthesia type: Spinal/Epidural  Patient location: PACU  Post pain: Pain level controlled  Post assessment: Post-op Vital signs reviewed  Last Vitals:  Filed Vitals:   06/25/13 1000  BP: 139/74  Pulse: 70  Temp:   Resp: 20    Post vital signs: Reviewed  Level of consciousness: awake  Complications: No apparent anesthesia complications

## 2013-06-25 NOTE — Transfer of Care (Signed)
Immediate Anesthesia Transfer of Care Note  Patient: Sandra Owen  Procedure(s) Performed: Procedure(s): HYSTERECTOMY VAGINAL (N/A) ANTERIOR (CYSTOCELE) AND POSTERIOR REPAIR (RECTOCELE) (N/A)  Patient Location: PACU  Anesthesia Type:Spinal and Epidural  Level of Consciousness: awake, alert  and oriented  Airway & Oxygen Therapy: Patient Spontanous Breathing  Post-op Assessment: Report given to PACU RN and Post -op Vital signs reviewed and stable  Post vital signs: Reviewed and stable  Complications: No apparent anesthesia complications

## 2013-06-26 ENCOUNTER — Encounter (HOSPITAL_COMMUNITY): Payer: Self-pay | Admitting: Obstetrics and Gynecology

## 2013-06-26 LAB — CBC
HCT: 34.5 % — ABNORMAL LOW (ref 36.0–46.0)
Hemoglobin: 12.1 g/dL (ref 12.0–15.0)
MCH: 31.4 pg (ref 26.0–34.0)
MCHC: 35.1 g/dL (ref 30.0–36.0)
MCV: 89.6 fL (ref 78.0–100.0)
Platelets: 206 10*3/uL (ref 150–400)
RBC: 3.85 MIL/uL — ABNORMAL LOW (ref 3.87–5.11)
RDW: 12.9 % (ref 11.5–15.5)
WBC: 8.8 10*3/uL (ref 4.0–10.5)

## 2013-06-26 MED ORDER — OXYCODONE-ACETAMINOPHEN 5-325 MG PO TABS
1.0000 | ORAL_TABLET | ORAL | Status: DC | PRN
  Administered 2013-06-26 (×4): 2 via ORAL
  Administered 2013-06-27: 1 via ORAL
  Administered 2013-06-27: 2 via ORAL
  Filled 2013-06-26: qty 1
  Filled 2013-06-26 (×5): qty 2

## 2013-06-26 NOTE — Progress Notes (Signed)
S: Stable this AM still has foley and packing that will be removed this AM. Has epidural and reports numbness on left thigh  O: Afebrile  BP 150/80  Pulse 51  Abdomen soft non tender.  Hg 12.1   WBC 8.8  Impression: Stable S/P vaginal hysterectomy and A&P repair.   Plan: Will reassess once catheter and pack are out for possible discharge today or tomorrow AM.

## 2013-06-26 NOTE — Progress Notes (Signed)
Pt's left leg numb from groin area to calf area. Pt has limited movement in leg but can move her left foot. Pt has control and feeling in right leg and foot. Overall generalized weakness in lower extremities

## 2013-06-26 NOTE — Progress Notes (Signed)
Patient had some breakthrough cramping last night. Left leg still numb. Epidural infusion stopped and epidural catheter removed intact. Percocet for pain. RN instructed to wait until left leg numbness resolved and then she can ambulate with assistance.

## 2013-06-26 NOTE — Progress Notes (Signed)
Reports her leg is still numb from the epidural. Unable to get catheter out due this and having problems ambulating from numbness of left leg.  Afebrile  BP 113/60  Pulse 66  Abdomen is soft all other systems are functioning.  Will await resolution of numbness and catheter removal before discharge.

## 2013-06-26 NOTE — Anesthesia Postprocedure Evaluation (Signed)
  Anesthesia Post-op Note  Patient: Sandra Owen  Procedure(s) Performed: Procedure(s): HYSTERECTOMY VAGINAL (N/A) ANTERIOR (CYSTOCELE) AND POSTERIOR REPAIR (RECTOCELE) (N/A)  Patient Location: [facility]  Anesthesia Type:Epidural  Level of Consciousness: awake, alert  and oriented  Airway and Oxygen Therapy: Patient Spontanous Breathing  Post-op Pain: none  Post-op Assessment: Post-op Vital signs reviewed, Patient's Cardiovascular Status Stable and Pain level controlled  Post-op Vital Signs: Reviewed and stable  Complications: residual numbness in left anterior thigh, will reevaluate by anesthesiologist in am.

## 2013-06-27 NOTE — Progress Notes (Signed)
S: Doing well this AM finally has gotten feeling back in left leg. Catheter is out and she is voiding well.  O: Afebrile  V/S are stable  Abdomen is soft   Impression:  Stable post op [facility] A&P repair  Plan: Discharge home           Return to office in 4 weeks           Percocet 5/325 one every three hours if needed for pai           Condition improved            Regular diet.

## 2013-06-27 NOTE — Plan of Care (Signed)
Problem: Discharge Progression Outcomes Goal: Other Discharge Outcomes/Goals Outcome: Completed/Met Date Met:  06/27/13 Ambulated well  Voiding well

## 2013-06-27 NOTE — Plan of Care (Signed)
Problem: Phase I Progression Outcomes Goal: Dangle/OOB as tolerated per MD order Outcome: Completed/Met Date Met:  06/27/13 Unable to ambulate today but can stand at bedside, sit on side of bed.

## 2013-06-27 NOTE — Progress Notes (Signed)
Pt out in wheelchair  Sensation in left  Leg good  Teaching complete

## 2013-07-02 NOTE — Discharge Summary (Signed)
NAMETIFFANE, Sandra Owen                ACCOUNT NO.:  [id number]  MEDICAL RECORD NO.:  [id number]  LOCATION:  9302                          FACILITY:  [facility]  PHYSICIAN:  Miguel Aschoff, M.D.       DATE OF BIRTH:  10/17/1951  DATE OF ADMISSION:  06/25/2013 DATE OF DISCHARGE:  06/27/2013                              DISCHARGE SUMMARY   PREOPERATIVE DIAGNOSES:  Symptomatic pelvic relaxation with uterine prolapse, cystocele, and rectocele.  OPERATION AND PROCEDURES:  Total vaginal hysterectomy with anterior and posterior colporrhaphy.  BRIEF HISTORY:  The patient is a 62 year old white female, reported the onset of pelvic pressure and pain and noticing something protruding from the vagina.  On examination, she was noted to have a significant cystocele which was sending to the introitus as well as descensus of her cervix as well as a small rectocele.  Because of the pelvic floor defects, the patient had requested that a procedure be carried out in effort to correct these weaknesses in her pelvis and was admitted to the hospital to undergo vaginal hysterectomy with anterior and posterior, inferior colporrhaphy.  Preoperative studies were obtained, this included a chemistry profile, which was essentially within normal limits.  CBC was within normal limits.  PT and PTT were within normal limits.  Normal limits as per her urinalysis.  HOSPITAL COURSE:  On June 25, 2013, a total vaginal hysterectomy and anterior and posterior colporrhaphy were carried out without difficulty. This was done under combination of spinal and epidural anesthesia.  The patient tolerated the procedure well, and had an essentially uncomplicated postoperative course except for some lingering numbness in her right thigh and leg which resolved by the evening of June 26, 2013, at which time, was possible to remove her urinary catheter.  The patient was able to void spontaneously once the catheter was removed and was able to  ambulate and tolerate a regular diet.  She was sent home on June 27, 2013, in satisfactory condition.  Medications for home included Percocet 1 every 6 hours as needed for pain.  She was instructed to resume all prior medications, to call if there are any problems such as fever, pain, or heavy bleeding.  She was told to place nothing in vagina until seen for her postop visit.  She is to call if there are any problems such as fever, pain, or heavy bleeding.  She was sent home in satisfactory condition on a regular diet.  She will be seen back in 4 weeks for followup examination.     Miguel Aschoff, M.D.     AR/MEDQ  D:  07/01/2013  T:  07/02/2013  Job:  [id number]

## 2013-07-12 ENCOUNTER — Encounter: Payer: Self-pay | Admitting: Internal Medicine

## 2013-09-03 ENCOUNTER — Ambulatory Visit (INDEPENDENT_AMBULATORY_CARE_PROVIDER_SITE_OTHER): Payer: BC Managed Care – PPO | Admitting: Family Medicine

## 2013-09-03 ENCOUNTER — Telehealth: Payer: Self-pay

## 2013-09-03 ENCOUNTER — Encounter: Payer: Self-pay | Admitting: Family Medicine

## 2013-09-03 VITALS — BP 130/82 | HR 72 | Temp 98.6°F | Wt 148.6 lb

## 2013-09-03 DIAGNOSIS — N390 Urinary tract infection, site not specified: Secondary | ICD-10-CM

## 2013-09-03 DIAGNOSIS — H1012 Acute atopic conjunctivitis, left eye: Secondary | ICD-10-CM

## 2013-09-03 DIAGNOSIS — R350 Frequency of micturition: Secondary | ICD-10-CM

## 2013-09-03 DIAGNOSIS — H1045 Other chronic allergic conjunctivitis: Secondary | ICD-10-CM

## 2013-09-03 DIAGNOSIS — E039 Hypothyroidism, unspecified: Secondary | ICD-10-CM

## 2013-09-03 DIAGNOSIS — I10 Essential (primary) hypertension: Secondary | ICD-10-CM

## 2013-09-03 LAB — POCT URINALYSIS DIPSTICK
Bilirubin, UA: NEGATIVE
Glucose, UA: NEGATIVE
Ketones, UA: NEGATIVE
Nitrite, UA: NEGATIVE
Protein, UA: NEGATIVE
Spec Grav, UA: 1.01
Urobilinogen, UA: 0.2
pH, UA: 7

## 2013-09-03 LAB — TSH: TSH: 0.67 u[IU]/mL (ref 0.35–5.50)

## 2013-09-03 MED ORDER — AZELASTINE HCL 0.05 % OP SOLN
1.0000 [drp] | Freq: Two times a day (BID) | OPHTHALMIC | Status: DC
Start: 2013-09-03 — End: 2016-11-22

## 2013-09-03 MED ORDER — HYDROCHLOROTHIAZIDE 25 MG PO TABS: 25.0000 mg | ORAL_TABLET | Freq: Every day | ORAL | Status: DC

## 2013-09-03 MED ORDER — CIPROFLOXACIN HCL 500 MG PO TABS: 500.0000 mg | ORAL_TABLET | Freq: Two times a day (BID) | ORAL | Status: DC

## 2013-09-03 MED ORDER — CIPROFLOXACIN HCL 500 MG PO TABS: 500.0000 mg | ORAL_TABLET | Freq: Two times a day (BID) | ORAL | Status: AC

## 2013-09-03 NOTE — Patient Instructions (Addendum)

## 2013-09-03 NOTE — Telephone Encounter (Signed)
Message copied by Arnette Norris on Tue Sep 03, 2013 10:17 AM ------      Message from: Lelon Perla      Created: Tue Sep 03, 2013  9:47 AM       Please cancel [facility] ------

## 2013-09-03 NOTE — Telephone Encounter (Signed)
Rx cancel.       KP

## 2013-09-03 NOTE — Progress Notes (Signed)
  Subjective:    Sandra Owen is a 62 y.o. female who complains of dysuria and frequency. She has had symptoms for a few days. Patient also complains of nothing related to urine. Patient denies congestion, cough, fever, headache, rhinitis and sorethroat. Patient does not have a history of recurrent UTI. Patient does not have a history of pyelonephritis.  Pt also c/o L eye crusting in am mostly. She has used otc gtts with no relief.   Pt also needs a refill of her hctz and needs her tsh checked.  No other complaints or symptoms.    The following portions of the patient's history were reviewed and updated as appropriate: allergies, current medications, past family history, past medical history, past social history, past surgical history and problem list.  Review of Systems Pertinent items are noted in HPI.    Objective:    BP 130/82  Pulse 72  Temp(Src) 98.6 F (37 C) (Oral)  Wt 148 lb 9.6 oz (67.405 kg)  BMI 25.49 kg/m2  SpO2 97% General appearance: alert, cooperative, appears stated age and no distress Eyes: conjunctivae/corneas clear. PERRL, EOM's intact. Fundi benign. Ears: normal TM's and external ear canals both ears Nose: Nares normal. Septum midline. Mucosa normal. No drainage or sinus tenderness. Throat: lips, mucosa, and tongue normal; teeth and gums normal Lungs: clear to auscultation bilaterally Heart: S1, S2 normal  Laboratory:  Urine dipstick: 2+ for hemoglobin and 2+ for leukocyte esterase.   Micro exam: not done.    Assessment:    dysuria     Plan:    Medications: ciprofloxacin. Maintain adequate hydration. Follow up if symptoms not improving, and as needed.

## 2013-09-03 NOTE — Assessment & Plan Note (Signed)
optivar Consider oph

## 2013-09-03 NOTE — Assessment & Plan Note (Signed)
Check tsh con't meds 

## 2013-09-03 NOTE — Assessment & Plan Note (Signed)
-   Refill hctz

## 2013-09-05 LAB — URINE CULTURE: Colony Count: 9000

## 2013-09-11 ENCOUNTER — Other Ambulatory Visit: Payer: Self-pay | Admitting: Internal Medicine

## 2013-09-12 NOTE — Telephone Encounter (Signed)
rx refill- Norco/vicodin  Last OV- 09/03/13 w/Dr. Laury Axon  Last refilled by historical provider 08/08/13  UDS low risk - 06/13/13   please advise. DJR

## 2013-09-17 ENCOUNTER — Telehealth: Payer: Self-pay | Admitting: General Practice

## 2013-09-17 MED ORDER — HYDROCODONE-ACETAMINOPHEN 5-325 MG PO TABS: 1.0000 | ORAL_TABLET | Freq: Every day | ORAL | Status: DC | PRN

## 2013-09-17 MED ORDER — LEVOTHYROXINE SODIUM 100 MCG PO TABS: ORAL_TABLET | ORAL | Status: DC

## 2013-09-17 NOTE — Telephone Encounter (Signed)
Advise patient, will refill the medication, needs a CPX, please arrange at the patient's earliest convenience

## 2013-09-17 NOTE — Telephone Encounter (Signed)
rx refill- Norco/vicodin  Last OV- 09/03/13 w/Dr. Laury Axon  Last refilled by historical provider 08/08/13  UDS low risk - 06/13/13  please advise. DJR

## 2013-10-19 LAB — HM MAMMOGRAPHY

## 2013-10-24 ENCOUNTER — Other Ambulatory Visit: Payer: Self-pay

## 2014-02-16 LAB — HM PAP SMEAR

## 2014-02-25 ENCOUNTER — Encounter: Payer: Self-pay | Admitting: Family Medicine

## 2014-02-25 ENCOUNTER — Ambulatory Visit (INDEPENDENT_AMBULATORY_CARE_PROVIDER_SITE_OTHER): Payer: BC Managed Care – PPO | Admitting: Family Medicine

## 2014-02-25 VITALS — BP 120/78 | HR 75 | Temp 98.6°F | Wt 151.0 lb

## 2014-02-25 DIAGNOSIS — M479 Spondylosis, unspecified: Secondary | ICD-10-CM

## 2014-02-25 DIAGNOSIS — J329 Chronic sinusitis, unspecified: Secondary | ICD-10-CM

## 2014-02-25 DIAGNOSIS — H109 Unspecified conjunctivitis: Secondary | ICD-10-CM

## 2014-02-25 MED ORDER — AMOXICILLIN-POT CLAVULANATE 875-125 MG PO TABS: 1.0000 | ORAL_TABLET | Freq: Two times a day (BID) | ORAL | Status: DC

## 2014-02-25 MED ORDER — HYDROCODONE-ACETAMINOPHEN 5-325 MG PO TABS: 1.0000 | ORAL_TABLET | Freq: Every day | ORAL | Status: DC | PRN

## 2014-02-25 MED ORDER — METHYLPREDNISOLONE ACETATE 80 MG/ML IJ SUSP
80.0000 mg | Freq: Once | INTRAMUSCULAR | Status: AC
  Administered 2014-02-25: 80 mg via INTRAMUSCULAR

## 2014-02-25 MED ORDER — MOXIFLOXACIN HCL 0.5 % OP SOLN: 1.0000 [drp] | Freq: Three times a day (TID) | OPHTHALMIC | Status: DC

## 2014-02-25 MED ORDER — PREDNISONE 10 MG PO TABS: ORAL_TABLET | ORAL | Status: DC

## 2014-02-25 NOTE — Progress Notes (Signed)
  Subjective:     Sandra Owen is a 63 y.o. female who presents for evaluation of sinus pain. Symptoms include: congestion, cough, facial pain, headaches, nasal congestion, purulent rhinorrhea and sinus pressure. Onset of symptoms was 3 weeks ago. Symptoms have been gradually worsening since that time. Past history is significant for no history of pneumonia or bronchitis. Patient is a non-smoker.  Pt used optivar ,  alcr z systane.  The following portions of the patient's history were reviewed and updated as appropriate: allergies, current medications, past family history, past medical history, past social history, past surgical history and problem list.  Review of Systems Pertinent items are noted in HPI.   Objective:    BP 120/78  Pulse 75  Temp(Src) 98.6 F (37 C) (Oral)  Wt 151 lb (68.493 kg)  SpO2 98% General appearance: alert, cooperative, appears stated age and no distress Eyes: conjunctivae/corneas clear. PERRL, EOM's intact. Fundi benign. Ears: normal TM's and external ear canals both ears Nose: no discharge, green discharge, moderate congestion, turbinates red, swollen, sinus tenderness bilateral Throat: lips, mucosa, and tongue normal; teeth and gums normal Neck: mild anterior cervical adenopathy, no JVD, supple, symmetrical, trachea midline and thyroid not enlarged, symmetric, no tenderness/mass/nodules Lungs: clear to auscultation bilaterally Heart: S1, S2 normal   eyes--+ conjunctivitis a    Acute bacterial sinusitis.    Plan:    Nasal steroids per medication orders. Antihistamines per medication orders. Augmentin per medication orders.  vigamox

## 2014-02-25 NOTE — Progress Notes (Signed)
Pre visit review using our clinic review tool, if applicable. No additional management support is needed unless otherwise documented below in the visit note. 

## 2014-02-25 NOTE — Patient Instructions (Addendum)
Sinusitis Sinusitis is redness, soreness, and swelling (inflammation) of the paranasal sinuses. Paranasal sinuses are air pockets within the bones of your face (beneath the eyes, the middle of the forehead, or above the eyes). In healthy paranasal sinuses, mucus is able to drain out, and air is able to circulate through them by way of your nose. However, when your paranasal sinuses are inflamed, mucus and air can become trapped. This can allow bacteria and other germs to grow and cause infection. Sinusitis can develop quickly and last only a short time (acute) or continue over a long period (chronic). Sinusitis that lasts for more than 12 weeks is considered chronic.  CAUSES  Causes of sinusitis include:  Allergies.  Structural abnormalities, such as displacement of the cartilage that separates your nostrils (deviated septum), which can decrease the air flow through your nose and sinuses and affect sinus drainage.  Functional abnormalities, such as when the small hairs (cilia) that line your sinuses and help remove mucus do not work properly or are not present. SYMPTOMS  Symptoms of acute and chronic sinusitis are the same. The primary symptoms are pain and pressure around the affected sinuses. Other symptoms include:  Upper toothache.  Earache.  Headache.  Bad breath.  Decreased sense of smell and taste.  A cough, which worsens when you are lying flat.  Fatigue.  Fever.  Thick drainage from your nose, which often is green and may contain pus (purulent).  Swelling and warmth over the affected sinuses. DIAGNOSIS  Your caregiver will perform a physical exam. During the exam, your caregiver may:  Look in your nose for signs of abnormal growths in your nostrils (nasal polyps).  Tap over the affected sinus to check for signs of infection.  View the inside of your sinuses (endoscopy) with a special imaging device with a light attached (endoscope), which is inserted into your  sinuses. If your caregiver suspects that you have chronic sinusitis, one or more of the following tests may be recommended:  Allergy tests.  Nasal culture A sample of mucus is taken from your nose and sent to a lab and screened for bacteria.  Nasal cytology A sample of mucus is taken from your nose and examined by your caregiver to determine if your sinusitis is related to an allergy. TREATMENT  Most cases of acute sinusitis are related to a viral infection and will resolve on their own within 10 days. Sometimes medicines are prescribed to help relieve symptoms (pain medicine, decongestants, nasal steroid sprays, or saline sprays).  However, for sinusitis related to a bacterial infection, your caregiver will prescribe antibiotic medicines. These are medicines that will help kill the bacteria causing the infection.  Rarely, sinusitis is caused by a fungal infection. In theses cases, your caregiver will prescribe antifungal medicine. For some cases of chronic sinusitis, surgery is needed. Generally, these are cases in which sinusitis recurs more than 3 times per year, despite other treatments. HOME CARE INSTRUCTIONS   Drink plenty of water. Water helps thin the mucus so your sinuses can drain more easily.  Use a humidifier.  Inhale steam 3 to 4 times a day (for example, sit in the bathroom with the shower running).  Apply a warm, moist washcloth to your face 3 to 4 times a day, or as directed by your caregiver.  Use saline nasal sprays to help moisten and clean your sinuses.  Take over-the-counter or prescription medicines for pain, discomfort, or fever only as directed by your caregiver. SEEK IMMEDIATE MEDICAL   CARE IF:  You have increasing pain or severe headaches.  You have nausea, vomiting, or drowsiness.  You have swelling around your face.  You have vision problems.  You have a stiff neck.  You have difficulty breathing. MAKE SURE YOU:   Understand these  instructions.  Will watch your condition.  Will get help right away if you are not doing well or get worse. Document Released: 12/05/2005 Document Revised: 02/27/2012 Document Reviewed: 12/20/2011 [facility] Patient Information 2014 [place], [place].   Conjunctivitis Conjunctivitis is commonly called "pink eye." Conjunctivitis can be caused by bacterial or viral infection, allergies, or injuries. There is usually redness of the lining of the eye, itching, discomfort, and sometimes discharge. There may be deposits of matter along the eyelids. A viral infection usually causes a watery discharge, while a bacterial infection causes a yellowish, thick discharge. Pink eye is very contagious and spreads by direct contact. You may be given antibiotic eyedrops as part of your treatment. Before using your eye medicine, remove all drainage from the eye by washing gently with warm water and cotton balls. Continue to use the medication until you have awakened 2 mornings in a row without discharge from the eye. Do not rub your eye. This increases the irritation and helps spread infection. Use separate towels from other household members. Wash your hands with soap and water before and after touching your eyes. Use cold compresses to reduce pain and sunglasses to relieve irritation from light. Do not wear contact lenses or wear eye makeup until the infection is gone. SEEK MEDICAL CARE IF:   Your symptoms are not better after 3 days of treatment.  You have increased pain or trouble seeing.  The outer eyelids become very red or swollen. Document Released: 01/12/2005 Document Revised: 02/27/2012 Document Reviewed: 12/05/2005 [facility] Patient Information 2014 [place].

## 2014-03-19 ENCOUNTER — Other Ambulatory Visit: Payer: Self-pay | Admitting: Family Medicine

## 2014-05-05 ENCOUNTER — Ambulatory Visit (INDEPENDENT_AMBULATORY_CARE_PROVIDER_SITE_OTHER): Payer: BC Managed Care – PPO | Admitting: Internal Medicine

## 2014-05-05 ENCOUNTER — Encounter: Payer: Self-pay | Admitting: Internal Medicine

## 2014-05-05 VITALS — BP 122/68 | HR 69 | Temp 98.3°F | Wt 150.0 lb

## 2014-05-05 DIAGNOSIS — M479 Spondylosis, unspecified: Secondary | ICD-10-CM

## 2014-05-05 DIAGNOSIS — J329 Chronic sinusitis, unspecified: Secondary | ICD-10-CM

## 2014-05-05 DIAGNOSIS — Z23 Encounter for immunization: Secondary | ICD-10-CM

## 2014-05-05 MED ORDER — HYDROCODONE-HOMATROPINE 5-1.5 MG/5ML PO SYRP: 5.0000 mL | ORAL_SOLUTION | Freq: Every evening | ORAL | Status: DC | PRN

## 2014-05-05 MED ORDER — AMOXICILLIN 500 MG PO CAPS: 1000.0000 mg | ORAL_CAPSULE | Freq: Two times a day (BID) | ORAL | Status: DC

## 2014-05-05 MED ORDER — HYDROCODONE-ACETAMINOPHEN 5-325 MG PO TABS: 1.0000 | ORAL_TABLET | Freq: Every day | ORAL | Status: DC | PRN

## 2014-05-05 NOTE — Progress Notes (Signed)
Subjective:    Patient ID: Sandra Owen, female    DOB: 02-18-1951, 63 y.o.   MRN: [id number]  DOS:  05/05/2014 Type of  visit: Acute visit Symptoms started a week ago with sore throat which is not better, sinus congestion, cough, worse at night. Having a hard time sleeping due to cough. Taking a number of OTCs for cough and congestion  ROS Had mild fever with the onset of symptoms along with chills. No nausea, vomiting, diarrhea Occasional greenish sputum production, in the last 2 days has seen traces of blood mixed with the sputum.  Past Medical History  Diagnosis Date  . Hypertension   . Hypothyroidism   . Allergic rhinitis   . Anxiety and depression     not currently  . PONV (postoperative nausea and vomiting)     Past Surgical History  Procedure Laterality Date  . Tubal ligation    . Tonsillectomy    . Bunionectomy  1999  . Vaginal hysterectomy N/A 06/25/2013    Procedure: HYSTERECTOMY VAGINAL;  Surgeon: Gus Height, MD;  Location: [facility];  Service: Gynecology;  Laterality: N/A;  . Anterior and posterior repair N/A 06/25/2013    Procedure: ANTERIOR (CYSTOCELE) AND POSTERIOR REPAIR (RECTOCELE);  Surgeon: Gus Height, MD;  Location: [facility];  Service: Gynecology;  Laterality: N/A;    History   Social History  . Marital Status: Married    Spouse Name: N/A    Number of Children: 1  . Years of Education: N/A   Occupational History  . sales     Social History Main Topics  . Smoking status: Never Smoker   . Smokeless tobacco: Never Used  . Alcohol Use: Yes     Comment: socially  . Drug Use: No  . Sexual Activity: Not on file   Other Topics Concern  . Not on file   Social History Narrative  . No narrative on file        Medication List       This list is accurate as of: 05/05/14 11:59 PM.  Always use your most recent med list.               amoxicillin 500 MG capsule  Commonly known as:  AMOXIL  Take 2 capsules (1,000 mg total) by mouth 2 (two)  times daily.     azelastine 0.05 % ophthalmic solution  Commonly known as:  OPTIVAR  Place 1 drop into the left eye 2 (two) times daily.     fluticasone 50 MCG/ACT nasal spray  Commonly known as:  FLONASE  Place 2 sprays into the nose daily as needed for rhinitis or allergies.     hydrochlorothiazide 25 MG tablet  Commonly known as:  HYDRODIURIL  TAKE 1 TABLET DAILY     HYDROcodone-acetaminophen 5-325 MG per tablet  Commonly known as:  NORCO/VICODIN  Take 1 tablet by mouth daily as needed.     HYDROcodone-homatropine 5-1.5 MG/5ML syrup  Commonly known as:  HYCODAN  Take 5 mLs by mouth at bedtime as needed for cough.     levothyroxine 100 MCG tablet  Commonly known as:  SYNTHROID, LEVOTHROID  TAKE 1 TABLET (100 MCG TOTAL) BY MOUTH DAILY.     SYSTANE OP  Apply to eye.           Objective:   Physical Exam BP 122/68  Pulse 69  Temp(Src) 98.3 F (36.8 C) (Oral)  Wt 150 lb (68.04 kg)  SpO2 99%  General --  alert, well-developed, NAD.  HEENT-- Not pale. TMs-- wax on the R, normal on the L; normal, throat symmetric, no redness or discharge. Face symmetric, slt  TTP at L maxilary. Nose not congested. Lungs -- normal respiratory effort, no intercostal retractions, no accessory muscle use, and normal breath sounds.  Heart-- normal rate, regular rhythm, no murmur.   Extremities-- no pretibial edema bilaterally  Neurologic--  alert & oriented X3. Speech normal, gait normal, strength normal in all extremities.  Psych-- Cognition and judgment appear intact. Cooperative with normal attention span and concentration. No anxious or depressed appearing.       Assessment & Plan:   bronchitis, early sinusitis?. Patient presents with respiratory symptoms for one week, has seen traces of blood in the sputum in the last 2 days. She is not toxic. Treat as bronchitis/sinusitis, if not improving she knows to call me. She is due for a physical, patient aware

## 2014-05-05 NOTE — Patient Instructions (Signed)
Rest, fluids , tylenol For cough, take Mucinex DM twice a day as needed  If the cough is severe at night, take the hydrocodone syrup (don't mix  it with the pain medication, it has also hydrocodone on it) If nasal congestion use OTC Nasocort: 2 nasal sprays on each side of the nose daily until you feel better  Take the antibiotic as prescribed  (Amoxicillin) Call if no better in few days Call anytime if the symptoms are severe  You are due for a physical exam, fasting, please arrange within few weeks

## 2014-05-05 NOTE — Progress Notes (Signed)
Pre-visit discussion using our clinic review tool. No additional management support is needed unless otherwise documented below in the visit note.  

## 2014-06-10 ENCOUNTER — Encounter: Payer: Self-pay | Admitting: Internal Medicine

## 2014-06-10 ENCOUNTER — Ambulatory Visit (INDEPENDENT_AMBULATORY_CARE_PROVIDER_SITE_OTHER): Payer: BC Managed Care – PPO | Admitting: Internal Medicine

## 2014-06-10 VITALS — BP 117/83 | HR 85 | Temp 99.5°F | Resp 16 | Wt 149.2 lb

## 2014-06-10 DIAGNOSIS — R059 Cough, unspecified: Secondary | ICD-10-CM

## 2014-06-10 DIAGNOSIS — R05 Cough: Secondary | ICD-10-CM

## 2014-06-10 MED ORDER — DOXYCYCLINE HYCLATE 100 MG PO TABS: 100.0000 mg | ORAL_TABLET | Freq: Two times a day (BID) | ORAL | Status: DC

## 2014-06-10 MED ORDER — ALBUTEROL SULFATE HFA 108 (90 BASE) MCG/ACT IN AERS: 2.0000 | INHALATION_SPRAY | Freq: Four times a day (QID) | RESPIRATORY_TRACT | Status: DC | PRN

## 2014-06-10 MED ORDER — HYDROCODONE-HOMATROPINE 5-1.5 MG/5ML PO SYRP: 5.0000 mL | ORAL_SOLUTION | Freq: Every evening | ORAL | Status: DC | PRN

## 2014-06-10 MED ORDER — PREDNISONE 10 MG PO TABS: ORAL_TABLET | ORAL | Status: DC

## 2014-06-10 NOTE — Patient Instructions (Addendum)
Get the XR at [facility] and [address] (10 minutes form here); they are open 24/7 [place], [place] [address] [phone]   Rest, fluids , tylenol For cough, take Mucinex DM twice a day as needed  Hydrocodone for nocturnal cough Prednisone for few days  If wheezing-- albuterol Take the antibiotic as prescribed  (doxy) Call if no better in few days Call anytime if the symptoms are severe

## 2014-06-10 NOTE — Progress Notes (Signed)
Subjective:    Patient ID: Sandra Owen, female    DOB: 12-29-1950, 63 y.o.   MRN: [id number]  DOS:  06/10/2014 Type of  Visit: Acute, not feeling better History: Was seen 05/05/2014 one-week history of upper respiratory symptoms, she was prescribed amoxicillin. She got better but never got completely well. Continue with cough, slightly worse in the last few days, intense particularly at night. Also some difficulty breathing with cough;  today for the first time since the last visit, saw streaks of red blood in the phlegm, she has the feeling the blood is coming from her sinuses.   ROS No fever or chills + Sinus pain and congestion. Occasional burning type of pain at the upper chest mostly with cough. No GERD symptoms No history of asthma, in the past has use a inhaler ; admits to occasional wheezing at night.   Past Medical History  Diagnosis Date  . Hypertension   . Hypothyroidism   . Allergic rhinitis   . Anxiety and depression     not currently  . PONV (postoperative nausea and vomiting)     Past Surgical History  Procedure Laterality Date  . Tubal ligation    . Tonsillectomy    . Bunionectomy  1999  . Vaginal hysterectomy N/A 06/25/2013    Procedure: HYSTERECTOMY VAGINAL;  Surgeon: Gus Height, MD;  Location: [facility];  Service: Gynecology;  Laterality: N/A;  . Anterior and posterior repair N/A 06/25/2013    Procedure: ANTERIOR (CYSTOCELE) AND POSTERIOR REPAIR (RECTOCELE);  Surgeon: Gus Height, MD;  Location: [facility];  Service: Gynecology;  Laterality: N/A;    History   Social History  . Marital Status: Married    Spouse Name: N/A    Number of Children: 1  . Years of Education: N/A   Occupational History  . sales     Social History Main Topics  . Smoking status: Never Smoker   . Smokeless tobacco: Never Used  . Alcohol Use: Yes     Comment: socially  . Drug Use: No  . Sexual Activity: Not on file   Other Topics Concern  . Not on file   Social History  Narrative  . No narrative on file        Medication List       This list is accurate as of: 06/10/14  5:25 PM.  Always use your most recent med list.               albuterol 108 (90 BASE) MCG/ACT inhaler  Commonly known as:  VENTOLIN HFA  Inhale 2 puffs into the lungs every 6 (six) hours as needed for wheezing or shortness of breath.     azelastine 0.05 % ophthalmic solution  Commonly known as:  OPTIVAR  Place 1 drop into the left eye 2 (two) times daily.     doxycycline 100 MG tablet  Commonly known as:  VIBRA-TABS  Take 1 tablet (100 mg total) by mouth 2 (two) times daily.     fluticasone 50 MCG/ACT nasal spray  Commonly known as:  FLONASE  Place 2 sprays into the nose daily as needed for rhinitis or allergies.     hydrochlorothiazide 25 MG tablet  Commonly known as:  HYDRODIURIL  TAKE 1 TABLET DAILY     HYDROcodone-acetaminophen 5-325 MG per tablet  Commonly known as:  NORCO/VICODIN  Take 1 tablet by mouth daily as needed.     HYDROcodone-homatropine 5-1.5 MG/5ML syrup  Commonly known as:  HYCODAN  Take 5 mLs by mouth at bedtime as needed for cough.     levothyroxine 100 MCG tablet  Commonly known as:  SYNTHROID, LEVOTHROID  TAKE 1 TABLET (100 MCG TOTAL) BY MOUTH DAILY.     predniSONE 10 MG tablet  Commonly known as:  DELTASONE  2 tabs a day x 5 days     SYSTANE OP  Apply to eye.           Objective:   Physical Exam BP 117/83  Pulse 85  Temp(Src) 99.5 F (37.5 C) (Oral)  Resp 16  Wt 149 lb 4 oz (67.699 kg)  SpO2 99% General -- alert, well-developed, NAD.  Neck --no mass or LAD HEENT-- Not pale. TMs L normal, R obscured by wax. Throat symmetric, no redness or discharge. Face symmetric, sinuses moderately   tender to palpation @ L maxilary area . Nose not congested.  Lungs -- normal respiratory effort, no intercostal retractions, no accessory muscle use, and normal breath sounds.  Heart-- normal rate, regular rhythm, no murmur.  Extremities--  no pretibial edema bilaterally  Neurologic--  alert & oriented X3. Speech normal, gait appropriate for age, strength symmetric and appropriate for age.  Psych-- Cognition and judgment appear intact. Cooperative with normal attention span and concentration. No anxious or depressed appearing.     Assessment & Plan:    Persistent cough for 4 weeks. Sinusitis +/- bronchitis. Plan:  chest x-ray Doxycycline, prednisone, albuterol if not improving in few days (? occult bronchospasm) ; see instructions

## 2014-06-10 NOTE — Progress Notes (Signed)
Pre visit review using our clinic review tool, if applicable. No additional management support is needed unless otherwise documented below in the visit note. 

## 2014-06-11 ENCOUNTER — Ambulatory Visit (HOSPITAL_BASED_OUTPATIENT_CLINIC_OR_DEPARTMENT_OTHER)
Admission: RE | Admit: 2014-06-11 | Discharge: 2014-06-11 | Disposition: A | Discharge status: Home / Self Care | Payer: BC Managed Care – PPO | Source: Ambulatory Visit | Attending: Internal Medicine | Admitting: Internal Medicine

## 2014-06-11 DIAGNOSIS — R059 Cough, unspecified: Secondary | ICD-10-CM

## 2014-06-11 DIAGNOSIS — R05 Cough: Secondary | ICD-10-CM

## 2014-06-11 DIAGNOSIS — R042 Hemoptysis: Secondary | ICD-10-CM | POA: Insufficient documentation

## 2014-06-11 DIAGNOSIS — J40 Bronchitis, not specified as acute or chronic: Secondary | ICD-10-CM | POA: Insufficient documentation

## 2014-06-12 ENCOUNTER — Telehealth: Payer: Self-pay | Admitting: Internal Medicine

## 2014-06-12 NOTE — Telephone Encounter (Signed)
Pt notified xrays normal.

## 2014-06-12 NOTE — Telephone Encounter (Signed)
Caller name: Brigette Relation to pt: patient Call back number: [phone] Pharmacy:  Reason for call: patient called to see if her x-rays were back yet. Please advise.

## 2014-06-18 ENCOUNTER — Other Ambulatory Visit: Payer: Self-pay | Admitting: Internal Medicine

## 2014-07-14 ENCOUNTER — Encounter: Payer: Self-pay | Admitting: Medical

## 2014-07-14 ENCOUNTER — Ambulatory Visit (INDEPENDENT_AMBULATORY_CARE_PROVIDER_SITE_OTHER): Payer: BC Managed Care – PPO | Admitting: Medical

## 2014-07-14 ENCOUNTER — Ambulatory Visit: Payer: BC Managed Care – PPO | Admitting: Medical

## 2014-07-14 VITALS — BP 140/90 | HR 64 | Temp 98.7°F | Wt 152.4 lb

## 2014-07-14 DIAGNOSIS — M47812 Spondylosis without myelopathy or radiculopathy, cervical region: Secondary | ICD-10-CM

## 2014-07-14 DIAGNOSIS — J309 Allergic rhinitis, unspecified: Secondary | ICD-10-CM

## 2014-07-14 DIAGNOSIS — H109 Unspecified conjunctivitis: Secondary | ICD-10-CM

## 2014-07-14 DIAGNOSIS — M542 Cervicalgia: Secondary | ICD-10-CM

## 2014-07-14 MED ORDER — HYDROCODONE-ACETAMINOPHEN 5-325 MG PO TABS: 1.0000 | ORAL_TABLET | Freq: Every day | ORAL | Status: DC | PRN

## 2014-07-14 MED ORDER — MOXIFLOXACIN HCL 0.5 % OP SOLN: 1.0000 [drp] | Freq: Three times a day (TID) | OPHTHALMIC | Status: DC

## 2014-07-14 NOTE — Assessment & Plan Note (Signed)
Vigamox prescription.

## 2014-07-14 NOTE — Progress Notes (Signed)
Pre visit review using our clinic review tool, if applicable. No additional management support is needed unless otherwise documented below in the visit note. 

## 2014-07-14 NOTE — Assessment & Plan Note (Signed)
Pt tells me has hx of severe pain that occurs. She told MA needs pain medication inermittently. She does have degenerative changes c4-c7. So rx today 30 tabs hydrocodone. Advised use sparingly.

## 2014-07-14 NOTE — Assessment & Plan Note (Addendum)
Pt is using antihistamine otc. Can continue.

## 2014-07-14 NOTE — Patient Instructions (Signed)
Advised to use vigamox for her left eye conjunctivitis. Continue your  otc antihistamine as well. For your  neck pain use hydrocodone sparingly for severe pain(If can tolerate nsaids use those for mild-moderate pain. Follow up in 7-10 days pcp or as needed.

## 2014-07-14 NOTE — Progress Notes (Signed)
   Subjective:    Patient ID: Sandra Owen, female    DOB: 1950-12-22, 63 y.o.   MRN: [id number]  HPI  Pt has left eye pinkish red x 10 days. Pt states water, itching but also colored matting yellow film to her left eye when she wakes. Rt eye mild itching. Hx of allergies and uses otc antihistamine. She can't remember which one. Pt has no contact with known [place]. Some coworkers of her have grandkids. Also going out of town.   Pt also mentioned to MA that she has arthritis in neck. Moderate to severe pain. Per pt she tells me she  gets periodic prescription of pain. About 1 prescription every 3 months. Xray does show degenerative changes to cspine c4-c7.    Review of Systems  Constitutional: Negative for fever, chills and fatigue.  HENT: Negative for congestion, ear pain, facial swelling, nosebleeds, postnasal drip and rhinorrhea.   Eyes: Positive for itching.       Watery eyes but left eye mild pinkish red with yellow matting in the am.  Respiratory: Negative for cough, chest tightness and wheezing.   Cardiovascular: Negative for chest pain and palpitations.  Musculoskeletal: Positive for arthralgias and neck pain. Negative for joint swelling, myalgias and neck stiffness.       Pt has mid cervical spinal pain on and off. Somedays very severe type pain.  Neurological: Negative.        Objective:   Physical Exam  Constitutional: She is oriented to person, place, and time. She appears well-developed and well-nourished. No distress.  HENT:  Head: Normocephalic and atraumatic.  Right Ear: External ear normal.  Left Ear: External ear normal.  Eyes: EOM are normal. Pupils are equal, round, and reactive to light. Left eye exhibits no discharge. No scleral icterus.  Lt eye conjunctiva pinkish red appearance. No matting presently.  Neck: Normal range of motion. Neck supple. No tracheal deviation present. No thyromegaly present.  Mild mid cspine tenderness presently.  Cardiovascular:  Normal rate, regular rhythm and normal heart sounds.   Pulmonary/Chest: Effort normal and breath sounds normal. No respiratory distress. She has no wheezes. She has no rales.  Neurological: She is alert and oriented to person, place, and time.  Skin: She is not diaphoretic.              Assessment & Plan:

## 2014-07-16 ENCOUNTER — Ambulatory Visit: Payer: BC Managed Care – PPO | Admitting: Medical

## 2014-09-24 ENCOUNTER — Other Ambulatory Visit: Payer: Self-pay

## 2014-09-24 DIAGNOSIS — E039 Hypothyroidism, unspecified: Secondary | ICD-10-CM

## 2014-09-25 ENCOUNTER — Ambulatory Visit (INDEPENDENT_AMBULATORY_CARE_PROVIDER_SITE_OTHER): Payer: BC Managed Care – PPO | Admitting: Medical

## 2014-09-25 ENCOUNTER — Encounter: Payer: Self-pay | Admitting: Medical

## 2014-09-25 ENCOUNTER — Ambulatory Visit (HOSPITAL_BASED_OUTPATIENT_CLINIC_OR_DEPARTMENT_OTHER)
Admission: RE | Admit: 2014-09-25 | Discharge: 2014-09-25 | Disposition: A | Discharge status: Home / Self Care | Payer: BC Managed Care – PPO | Source: Ambulatory Visit | Attending: Medical | Admitting: Medical

## 2014-09-25 ENCOUNTER — Telehealth: Payer: Self-pay | Admitting: Medical

## 2014-09-25 VITALS — BP 162/81 | HR 59 | Temp 98.5°F | Ht 63.0 in | Wt 152.2 lb

## 2014-09-25 DIAGNOSIS — M545 Low back pain, unspecified: Secondary | ICD-10-CM

## 2014-09-25 DIAGNOSIS — M549 Dorsalgia, unspecified: Secondary | ICD-10-CM

## 2014-09-25 DIAGNOSIS — R319 Hematuria, unspecified: Secondary | ICD-10-CM | POA: Insufficient documentation

## 2014-09-25 DIAGNOSIS — E038 Other specified hypothyroidism: Secondary | ICD-10-CM

## 2014-09-25 LAB — POCT URINALYSIS DIPSTICK
Glucose, UA: NEGATIVE
Ketones, UA: NEGATIVE
Leukocytes, UA: NEGATIVE
Nitrite, UA: NEGATIVE
Spec Grav, UA: 1.025
Urobilinogen, UA: 0.2
pH, UA: 6

## 2014-09-25 LAB — TSH: TSH: 0.19 u[IU]/mL — ABNORMAL LOW (ref 0.35–4.50)

## 2014-09-25 MED ORDER — KETOROLAC TROMETHAMINE 60 MG/2ML IM SOLN
60.0000 mg | Freq: Once | INTRAMUSCULAR | Status: AC
  Administered 2014-09-25: 60 mg via INTRAMUSCULAR

## 2014-09-25 MED ORDER — LEVOTHYROXINE SODIUM 88 MCG PO TABS: 88.0000 ug | ORAL_TABLET | Freq: Every day | ORAL | Status: DC

## 2014-09-25 MED ORDER — CIPROFLOXACIN HCL 500 MG PO TABS: 500.0000 mg | ORAL_TABLET | Freq: Two times a day (BID) | ORAL | Status: DC

## 2014-09-25 MED ORDER — HYDROCODONE-ACETAMINOPHEN 5-325 MG PO TABS: 1.0000 | ORAL_TABLET | Freq: Four times a day (QID) | ORAL | Status: DC | PRN

## 2014-09-25 NOTE — Assessment & Plan Note (Signed)
Up today but I think may be due to moderate to severe pain. Will follow in near future and see if after current pain resolved if still high. If so then adjust meds. No cardiac, or neuro symptoms. Neg neuro exam.

## 2014-09-25 NOTE — Assessment & Plan Note (Signed)
Possibe kidney stone verses early uti/kideny infection. See current assesment and plan hematuria.

## 2014-09-25 NOTE — Assessment & Plan Note (Signed)
With lt cva and flank pain. Possible uti as well with dysuria. Will rx cipro, norco, get abdominal films/kub and urine culture. Follow pt and if worsening despite tx then consider CT abdomen renal stone protocol.

## 2014-09-25 NOTE — Progress Notes (Signed)
Pre visit review using our clinic review tool, if applicable. No additional management support is needed unless otherwise documented below in the visit note. 

## 2014-09-25 NOTE — Telephone Encounter (Signed)
Med ordered and lab order for hypothyroid

## 2014-09-25 NOTE — Patient Instructions (Addendum)
For your left side back pain with some flank pain, I prescribed hydrocodone tabs and we gave toradol Im in office today. I also ordered xray today to evaluate for possible kidney stone. We will let you know result of this xray/call when result is in. If your pain persists same level by tomorrow then would get ct since xrays don't show all stones.  I am prescribing cipro for possible uti and sending your urine for culture.  Call us tomorrow and give [place] update as to your level of pain and how you feel.

## 2014-09-25 NOTE — Progress Notes (Signed)
Subjective:    Patient ID: Sandra Owen, female    DOB: 05-Nov-1951, 63 y.o.   MRN: [id number]  HPI  Pt in with some frequent urination and some lt lower back pain. Some mild dysuria. Her back pain is the worse.  Pain level fluctautes. At worst 7/10 pain. Most of time pain 4-5/10 then will increase. Pain does radiate from cva area to left flank. Pt does have history of kidney stone. Pain that won't go away. Pt has pain for 2 days. Chills but no fever. Some nausea but no vomiting. Hx of some uti as well.   Pt also needs tsh done today.  Past Medical History  Diagnosis Date  . Hypertension   . Hypothyroidism   . Allergic rhinitis   . Anxiety and depression     not currently  . PONV (postoperative nausea and vomiting)     History   Social History  . Marital Status: Married    Spouse Name: N/A    Number of Children: 1  . Years of Education: N/A   Occupational History  . sales     Social History Main Topics  . Smoking status: Never Smoker   . Smokeless tobacco: Never Used  . Alcohol Use: Yes     Comment: socially  . Drug Use: No  . Sexual Activity: Not on file   Other Topics Concern  . Not on file   Social History Narrative  . No narrative on file    Past Surgical History  Procedure Laterality Date  . Tubal ligation    . Tonsillectomy    . Bunionectomy  1999  . Vaginal hysterectomy N/A 06/25/2013    Procedure: HYSTERECTOMY VAGINAL;  Surgeon: Gus Height, MD;  Location: [facility];  Service: Gynecology;  Laterality: N/A;  . Anterior and posterior repair N/A 06/25/2013    Procedure: ANTERIOR (CYSTOCELE) AND POSTERIOR REPAIR (RECTOCELE);  Surgeon: Gus Height, MD;  Location: [facility];  Service: Gynecology;  Laterality: N/A;    Family History  Problem Relation Age of Onset  . Colon cancer Neg Hx   . Breast cancer Neg Hx   . Pancreatic cancer Father     Allergies  Allergen Reactions  . Codeine Nausea Only  . Tape Other (See Comments)    Dermatitis    Current  Outpatient Prescriptions on File Prior to Visit  Medication Sig Dispense Refill  . fluticasone (FLONASE) 50 MCG/ACT nasal spray Place 2 sprays into the nose daily as needed for rhinitis or allergies.      . hydrochlorothiazide (HYDRODIURIL) 25 MG tablet TAKE 1 TABLET DAILY  90 tablet  1  . levothyroxine (SYNTHROID, LEVOTHROID) 100 MCG tablet TAKE 1 TABLET DAILY  90 tablet  0  . albuterol (VENTOLIN HFA) 108 (90 BASE) MCG/ACT inhaler Inhale 2 puffs into the lungs every 6 (six) hours as needed for wheezing or shortness of breath.  1 Inhaler  1  . azelastine (OPTIVAR) 0.05 % ophthalmic solution Place 1 drop into the left eye 2 (two) times daily.  6 mL  12  . HYDROcodone-acetaminophen (NORCO/VICODIN) 5-325 MG per tablet Take 1 tablet by mouth daily as needed.  30 tablet  0  . moxifloxacin (VIGAMOX) 0.5 % ophthalmic solution Place 1 drop into the left eye 3 (three) times daily.  3 mL  0  . Polyethyl Glycol-Propyl Glycol (SYSTANE OP) Apply to eye.       No current facility-administered medications on file prior to visit.    BP  162/81  Pulse 59  Temp(Src) 98.5 F (36.9 C) (Oral)  Ht 5\' 3"  (1.6 m)  Wt 152 lb 3.2 oz (69.037 kg)  BMI 26.97 kg/m2  SpO2 99%         Review of Systems  Constitutional: Positive for chills. Negative for fever and fatigue.  HENT: Negative.   Respiratory: Negative for cough, chest tightness, shortness of breath and wheezing.   Cardiovascular: Negative for chest pain and palpitations.  Gastrointestinal: Positive for nausea and abdominal pain. Negative for vomiting, diarrhea, constipation, blood in stool, abdominal distention, anal bleeding and rectal pain.       Mild left flank pain radiating from lower back.  Genitourinary: Positive for dysuria, frequency and flank pain. Negative for hematuria, decreased urine volume, difficulty urinating and vaginal pain.  Musculoskeletal: Positive for back pain.  Neurological: Negative.   Hematological: Negative for  adenopathy. Does not bruise/bleed easily.  Psychiatric/Behavioral: Negative.        Objective:   Physical Exam  General Appearance- Not in acute distress.  HEENT Eyes- Scleraeral/Conjuntiva-bilat- Not Yellow. Mouth & Throat- Normal.  Chest and Lung Exam Auscultation: Breath sounds:-Normal. Adventitious sounds:- No Adventitious sounds.CTA.  Cardiovascular Auscultation:Rythm - Regular. Heart Sounds -Normal heart sounds.  Abdomen Inspection:-Inspection Normal.  Palpation/Perucssion: Palpationl-  Tenderness mild left upper quadrant/flank., No Rebound tenderness, No rigidity(Guarding) and No Palpable abdominal masses.  Liver:-Normal.  Spleen:- Normal.   Back- no rt cva tenderness. Lt side lower cva tender and over just above lt iliac crest region. Moderate to severe. No midspine pain.  Neurologic Cn III-XII grossly intact.     Assessment & Plan:

## 2014-09-26 ENCOUNTER — Telehealth: Payer: Self-pay

## 2014-09-26 LAB — URINE CULTURE
Colony Count: NO GROWTH
Organism ID, Bacteria: NO GROWTH

## 2014-09-26 NOTE — Telephone Encounter (Signed)
Sandra Owen [phone]  Rindy called to get Xray results from yesterday

## 2014-09-26 NOTE — Telephone Encounter (Signed)
Spoke with patient regarding results. See notes in chart.

## 2014-09-30 ENCOUNTER — Other Ambulatory Visit: Payer: BC Managed Care – PPO

## 2014-10-01 ENCOUNTER — Other Ambulatory Visit: Payer: Self-pay | Admitting: Family Medicine

## 2014-10-01 ENCOUNTER — Other Ambulatory Visit: Payer: Self-pay | Admitting: Internal Medicine

## 2014-11-27 ENCOUNTER — Telehealth: Payer: Self-pay

## 2014-11-27 MED ORDER — HYDROCODONE-ACETAMINOPHEN 5-325 MG PO TABS: 1.0000 | ORAL_TABLET | Freq: Four times a day (QID) | ORAL | Status: DC | PRN

## 2014-11-27 NOTE — Telephone Encounter (Signed)
Pt is requesting refill on Hydrocodone.  Last OV with you: 06/10/2014, Pt saw Percell Miller on 09/25/2014 Last Fill: 09/25/2014 # 30 0RF UDS: 06/13/2013 Low risk  Please advise.

## 2014-11-27 NOTE — Telephone Encounter (Signed)
LMOM informing Pt that medication is ready for pick up at front desk.

## 2014-11-27 NOTE — Telephone Encounter (Signed)
Done

## 2014-11-27 NOTE — Telephone Encounter (Signed)
Sandra Owen [phone]  [name] called and she needs a refill on her HYDROcodone-acetaminophen (Nicut) 5-325 MG per tablet , please call when ready.

## 2015-02-22 ENCOUNTER — Other Ambulatory Visit: Payer: Self-pay | Admitting: Internal Medicine

## 2015-03-04 ENCOUNTER — Telehealth: Payer: Self-pay | Admitting: Internal Medicine

## 2015-03-04 MED ORDER — HYDROCODONE-ACETAMINOPHEN 5-325 MG PO TABS: 1.0000 | ORAL_TABLET | Freq: Four times a day (QID) | ORAL | Status: DC | PRN

## 2015-03-04 NOTE — Telephone Encounter (Signed)
Spoke with Pt, informed her Rx is ready for pick up at front desk.

## 2015-03-04 NOTE — Telephone Encounter (Signed)
Ok 90, we can do a uds at next OV

## 2015-03-04 NOTE — Telephone Encounter (Signed)
Rx printed, awaiting signature by Dr. Paz.  

## 2015-03-04 NOTE — Telephone Encounter (Signed)
Pt is requesting a refill on Hydrocodone.  Last OV: 06/10/2014 Last Fill: 11/27/2014 # 71 0RF UDS: 06/13/2013 Low risk: DUE FOR UDS  Please advise.

## 2015-03-04 NOTE — Telephone Encounter (Signed)
Caller name: Lulu, Hirschmann Relation to pt:  Self  Call back number: [phone]   Reason for call:  Pt requesting a refill HYDROcodone-acetaminophen (NORCO) 5-325 MG per tablet. Pt has a lab appointment 03/05/15 and would like to pick up RX. Please advise

## 2015-03-05 ENCOUNTER — Other Ambulatory Visit (INDEPENDENT_AMBULATORY_CARE_PROVIDER_SITE_OTHER): Payer: BLUE CROSS/BLUE SHIELD

## 2015-03-05 DIAGNOSIS — E038 Other specified hypothyroidism: Secondary | ICD-10-CM

## 2015-03-05 LAB — TSH: TSH: 7.39 u[IU]/mL — ABNORMAL HIGH (ref 0.35–4.50)

## 2015-03-12 ENCOUNTER — Telehealth: Payer: Self-pay | Admitting: Internal Medicine

## 2015-03-12 ENCOUNTER — Other Ambulatory Visit: Payer: Self-pay

## 2015-03-12 NOTE — Telephone Encounter (Signed)
Caller name: Sandra Owen Relation to pt: self Call back number: [phone] [facility]   Reason for call:   Requesting synthroid refill. She states that she came in about a week and a half ago to do labs?? 90 day supply

## 2015-03-12 NOTE — Telephone Encounter (Signed)
Appointment scheduled.

## 2015-03-12 NOTE — Telephone Encounter (Signed)
Pt has not been seen since June, she needs a F/U with Dr. Larose Kells and so that we can check her TSH (no lab appointment or appointment scheduled with Dr. Larose Kells).

## 2015-03-16 ENCOUNTER — Encounter: Payer: Self-pay | Admitting: Internal Medicine

## 2015-03-16 ENCOUNTER — Ambulatory Visit (INDEPENDENT_AMBULATORY_CARE_PROVIDER_SITE_OTHER): Payer: BLUE CROSS/BLUE SHIELD | Admitting: Internal Medicine

## 2015-03-16 ENCOUNTER — Other Ambulatory Visit: Payer: Self-pay

## 2015-03-16 VITALS — BP 118/72 | HR 70 | Temp 98.0°F | Ht 63.0 in | Wt 153.0 lb

## 2015-03-16 DIAGNOSIS — M159 Polyosteoarthritis, unspecified: Secondary | ICD-10-CM

## 2015-03-16 DIAGNOSIS — E039 Hypothyroidism, unspecified: Secondary | ICD-10-CM | POA: Diagnosis not present

## 2015-03-16 DIAGNOSIS — Z0000 Encounter for general adult medical examination without abnormal findings: Secondary | ICD-10-CM | POA: Diagnosis not present

## 2015-03-16 DIAGNOSIS — I10 Essential (primary) hypertension: Secondary | ICD-10-CM

## 2015-03-16 DIAGNOSIS — M150 Primary generalized (osteo)arthritis: Secondary | ICD-10-CM | POA: Diagnosis not present

## 2015-03-16 DIAGNOSIS — M8949 Other hypertrophic osteoarthropathy, multiple sites: Secondary | ICD-10-CM

## 2015-03-16 MED ORDER — LEVOTHYROXINE SODIUM 125 MCG PO TABS: 125.0000 ug | ORAL_TABLET | Freq: Every day | ORAL | Status: DC

## 2015-03-16 NOTE — Assessment & Plan Note (Signed)
Never had a colonoscopy Reportedly sees gynecology regularly, no labs  advise patient about importance to come back to the office regularly and  get yearly physical exams. Recommend to come back in 3 months

## 2015-03-16 NOTE — Assessment & Plan Note (Signed)
Good compliance with hydrochlorothiazide, BP today is very good, in the ambulatory setting BPs occasionally elevated associated with stress. Plan: BMP, CBC, FLP. Continue with hydrochlorothiazide.

## 2015-03-16 NOTE — Progress Notes (Signed)
Subjective:    Patient ID: Sandra Owen, female    DOB: 10/21/1951, 64 y.o.   MRN: [id number]  DOS:  03/16/2015 Type of visit - description : rov Interval history: Hypothyroidism, last TSH elevated, reports good medication compliance Hypertension, good compliance with HCTZ, ambulatory BPs usually okay except when she is stressed out about work. One time was 150/90 DJD, taking hydrocodone as needed only, recently saw orthopedic surgery due to back pain and she was diagnosed with a compression fracture.    Review of Systems Denies chest or difficulty breathing No nausea, vomiting, diarrhea. Admits to some constipation- fatigue.  Past Medical History  Diagnosis Date  . Hypertension   . Hypothyroidism   . Allergic rhinitis   . Anxiety and depression     not currently  . PONV (postoperative nausea and vomiting)     Past Surgical History  Procedure Laterality Date  . Tubal ligation    . Tonsillectomy    . Bunionectomy  1999  . Vaginal hysterectomy N/A 06/25/2013    Procedure: HYSTERECTOMY VAGINAL;  Surgeon: Gus Height, MD;  Location: [facility];  Service: Gynecology;  Laterality: N/A;  . Anterior and posterior repair N/A 06/25/2013    Procedure: ANTERIOR (CYSTOCELE) AND POSTERIOR REPAIR (RECTOCELE);  Surgeon: Gus Height, MD;  Location: [facility];  Service: Gynecology;  Laterality: N/A;    History   Social History  . Marital Status: Married    Spouse Name: N/A  . Number of Children: 1  . Years of Education: N/A   Occupational History  . sales     Social History Main Topics  . Smoking status: Never Smoker   . Smokeless tobacco: Never Used  . Alcohol Use: Yes     Comment: socially  . Drug Use: No  . Sexual Activity: Not on file   Other Topics Concern  . Not on file   Social History Narrative        Medication List       This list is accurate as of: 03/16/15  7:17 PM.  Always use your most recent med list.               albuterol 108 (90 BASE) MCG/ACT inhaler    Commonly known as:  VENTOLIN HFA  Inhale 2 puffs into the lungs every 6 (six) hours as needed for wheezing or shortness of breath.     azelastine 0.05 % ophthalmic solution  Commonly known as:  OPTIVAR  Place 1 drop into the left eye 2 (two) times daily.     fluticasone 50 MCG/ACT nasal spray  Commonly known as:  FLONASE  Place 2 sprays into the nose daily as needed for rhinitis or allergies.     hydrochlorothiazide 25 MG tablet  Commonly known as:  HYDRODIURIL  TAKE 1 TABLET DAILY     HYDROcodone-acetaminophen 5-325 MG per tablet  Commonly known as:  NORCO  Take 1 tablet by mouth every 6 (six) hours as needed for moderate pain.     levothyroxine 125 MCG tablet  Commonly known as:  SYNTHROID, LEVOTHROID  Take 1 tablet (125 mcg total) by mouth daily.     SYSTANE OP  Apply to eye.           Objective:   Physical Exam BP 118/72 mmHg  Pulse 70  Temp(Src) 98 F (36.7 C) (Oral)  Ht 5\' 3"  (1.6 m)  Wt 153 lb (69.4 kg)  BMI 27.11 kg/m2  SpO2 96% General:   Well  developed, well nourished . NAD.  HEENT:  Normocephalic . Face symmetric, atraumatic Lungs:  CTA B Normal respiratory effort, no intercostal retractions, no accessory muscle use. Heart: RRR,  no murmur.  Muscle skeletal: no pretibial edema bilaterally  Skin: Not pale. Not jaundice Neurologic:  alert & oriented X3.  Speech normal, gait appropriate for age and unassisted Psych--  Cognition and judgment appear intact.  Cooperative with normal attention span and concentration.  Behavior appropriate. No anxious or depressed appearing.       Assessment & Plan:

## 2015-03-16 NOTE — Progress Notes (Signed)
Pre visit review using our clinic review tool, if applicable. No additional management support is needed unless otherwise documented below in the visit note. 

## 2015-03-16 NOTE — Assessment & Plan Note (Signed)
Needs a UDS and contract. Takes hydrocodone for DJD. Also, recently saw orthopedic surgery December 2015, diagnosed with a compression fracture, reportedly had a MRI. Will discuss this further on return to the office, dexa?

## 2015-03-16 NOTE — Patient Instructions (Signed)
Get your blood work before you leave   You also need labs to be done in 6 weeks     Come back to the office in 3 months   for a physical exam  Please schedule an appointment at the front desk    Come back fasting

## 2015-03-16 NOTE — Assessment & Plan Note (Addendum)
Last TSH elevated, good compliance with Synthroid 100 mcg daily. Advise  patient the need to come back to the office for regular checkups. Will increase Synthroid to 125, labs in 6 weeks

## 2015-03-28 ENCOUNTER — Encounter: Payer: Self-pay | Admitting: Internal Medicine

## 2015-03-31 ENCOUNTER — Other Ambulatory Visit: Payer: Self-pay

## 2015-03-31 DIAGNOSIS — I10 Essential (primary) hypertension: Secondary | ICD-10-CM

## 2015-04-02 ENCOUNTER — Other Ambulatory Visit (INDEPENDENT_AMBULATORY_CARE_PROVIDER_SITE_OTHER): Payer: BLUE CROSS/BLUE SHIELD

## 2015-04-02 DIAGNOSIS — I10 Essential (primary) hypertension: Secondary | ICD-10-CM

## 2015-04-02 LAB — CBC WITH DIFFERENTIAL/PLATELET
Basophils Absolute: 0 10*3/uL (ref 0.0–0.1)
Basophils Relative: 0.7 % (ref 0.0–3.0)
Eosinophils Absolute: 0.2 10*3/uL (ref 0.0–0.7)
Eosinophils Relative: 5.2 % — ABNORMAL HIGH (ref 0.0–5.0)
HCT: 38.7 % (ref 36.0–46.0)
Hemoglobin: 13.4 g/dL (ref 12.0–15.0)
Lymphocytes Relative: 44.8 % (ref 12.0–46.0)
Lymphs Abs: 1.5 10*3/uL (ref 0.7–4.0)
MCHC: 34.6 g/dL (ref 30.0–36.0)
MCV: 92.1 fl (ref 78.0–100.0)
Monocytes Absolute: 0.4 10*3/uL (ref 0.1–1.0)
Monocytes Relative: 11.6 % (ref 3.0–12.0)
Neutro Abs: 1.2 10*3/uL — ABNORMAL LOW (ref 1.4–7.7)
Neutrophils Relative %: 37.7 % — ABNORMAL LOW (ref 43.0–77.0)
Platelets: 204 10*3/uL (ref 150.0–400.0)
RBC: 4.2 Mil/uL (ref 3.87–5.11)
RDW: 12.9 % (ref 11.5–15.5)
WBC: 3.3 10*3/uL — ABNORMAL LOW (ref 4.0–10.5)

## 2015-04-02 LAB — BASIC METABOLIC PANEL
BUN: 21 mg/dL (ref 6–23)
CO2: 31 mEq/L (ref 19–32)
Calcium: 9.8 mg/dL (ref 8.4–10.5)
Chloride: 106 mEq/L (ref 96–112)
Creatinine, Ser: 0.75 mg/dL (ref 0.40–1.20)
GFR: 83.09 mL/min (ref >60.00)
Glucose, Bld: 104 mg/dL — ABNORMAL HIGH (ref 70–99)
Potassium: 4.6 mEq/L (ref 3.5–5.1)
Sodium: 141 mEq/L (ref 135–145)

## 2015-04-02 LAB — LIPID PANEL
Cholesterol: 190 mg/dL (ref 0–200)
HDL: 63.8 mg/dL (ref >39.00)
LDL Cholesterol: 114 mg/dL — ABNORMAL HIGH (ref 0–99)
NonHDL: 126.2
Total CHOL/HDL Ratio: 3
Triglycerides: 59 mg/dL (ref 0.0–149.0)
VLDL: 11.8 mg/dL (ref 0.0–40.0)

## 2015-04-20 ENCOUNTER — Other Ambulatory Visit: Payer: BLUE CROSS/BLUE SHIELD

## 2015-04-24 ENCOUNTER — Encounter: Payer: Self-pay | Admitting: Internal Medicine

## 2015-06-01 ENCOUNTER — Other Ambulatory Visit: Payer: Self-pay | Admitting: Internal Medicine

## 2015-06-02 MED ORDER — HYDROCODONE-ACETAMINOPHEN 5-325 MG PO TABS: 1.0000 | ORAL_TABLET | Freq: Four times a day (QID) | ORAL | Status: DC | PRN

## 2015-06-02 NOTE — Telephone Encounter (Signed)
Pt is requesting refill on Hydrocodone.  Last OV: 03/16/2015 Last Fill: 03/04/2015 #90 0RF UDS: 06/13/2013 Low risk   Please advise.

## 2015-06-02 NOTE — Telephone Encounter (Signed)
Pt informed via MyChart that Rx is ready for pick up at front desk.

## 2015-06-02 NOTE — Telephone Encounter (Signed)
Rx printed, awaiting MD signature.  

## 2015-06-02 NOTE — Telephone Encounter (Signed)
Ok 90, no RF 

## 2015-06-04 ENCOUNTER — Other Ambulatory Visit (INDEPENDENT_AMBULATORY_CARE_PROVIDER_SITE_OTHER): Payer: BLUE CROSS/BLUE SHIELD

## 2015-06-04 DIAGNOSIS — E039 Hypothyroidism, unspecified: Secondary | ICD-10-CM

## 2015-06-04 LAB — TSH: TSH: 5.97 u[IU]/mL — ABNORMAL HIGH (ref 0.35–4.50)

## 2015-06-08 ENCOUNTER — Other Ambulatory Visit: Payer: Self-pay | Admitting: Internal Medicine

## 2015-06-09 ENCOUNTER — Other Ambulatory Visit: Payer: Self-pay

## 2015-06-09 DIAGNOSIS — E039 Hypothyroidism, unspecified: Secondary | ICD-10-CM

## 2015-06-09 MED ORDER — LEVOTHYROXINE SODIUM 150 MCG PO TABS: 150.0000 ug | ORAL_TABLET | Freq: Every day | ORAL | Status: DC

## 2015-06-09 NOTE — Addendum Note (Signed)
Addended by: Wilfrid Lund on: 06/09/2015 11:24 AM   Modules accepted: Medications

## 2015-07-14 ENCOUNTER — Telehealth: Payer: Self-pay | Admitting: Internal Medicine

## 2015-07-14 MED ORDER — HYDROCHLOROTHIAZIDE 25 MG PO TABS: 25.0000 mg | ORAL_TABLET | Freq: Every day | ORAL | Status: DC

## 2015-07-14 NOTE — Telephone Encounter (Signed)
Left msg for pt to call back and schedule cpe appt per AVS 03/16/15

## 2015-07-14 NOTE — Telephone Encounter (Signed)
Relation to pt: self  Call back number: [phone]   Reason for call:  Patient requesting a refill hydrochlorothiazide (HYDRODIURIL) 25 MG tablet

## 2015-07-14 NOTE — Telephone Encounter (Signed)
90 day supply sent to pharmacy.  02/24/15 office note advised pt to follow up in 3 months.  Please call pt to arrange follow up with Dr Larose Kells.

## 2015-07-29 ENCOUNTER — Telehealth: Payer: Self-pay | Admitting: Internal Medicine

## 2015-07-29 NOTE — Telephone Encounter (Signed)
Advise patient, she is due for a TSH, please arrange

## 2015-07-30 NOTE — Telephone Encounter (Signed)
Letter printed and mailed to Pt informing that she is due for lab appt to check her TSH.

## 2015-08-25 ENCOUNTER — Other Ambulatory Visit: Payer: Self-pay | Admitting: Internal Medicine

## 2015-08-25 ENCOUNTER — Other Ambulatory Visit (INDEPENDENT_AMBULATORY_CARE_PROVIDER_SITE_OTHER): Payer: BLUE CROSS/BLUE SHIELD

## 2015-08-25 DIAGNOSIS — E039 Hypothyroidism, unspecified: Secondary | ICD-10-CM

## 2015-08-25 LAB — TSH: TSH: 0.08 u[IU]/mL — ABNORMAL LOW (ref 0.35–4.50)

## 2015-08-25 MED ORDER — HYDROCODONE-ACETAMINOPHEN 5-325 MG PO TABS: 1.0000 | ORAL_TABLET | Freq: Four times a day (QID) | ORAL | Status: DC | PRN

## 2015-08-25 MED ORDER — LEVOTHYROXINE SODIUM 150 MCG PO TABS: 150.0000 ug | ORAL_TABLET | Freq: Every day | ORAL | Status: DC

## 2015-08-25 NOTE — Telephone Encounter (Signed)
Pt is requesting refill on Hydrocodone.  Last OV: 03/16/2015 Last Fill: 06/02/2015 #90 0RF UDS: 12/11/2012 Low risk  Due for UDS and contract at time of pick up  Please advise.

## 2015-08-25 NOTE — Telephone Encounter (Signed)
Ok 90, no RF 

## 2015-08-25 NOTE — Telephone Encounter (Signed)
Rx placed at front desk for pick up at Pt's convenience. Pt informed via MyChart.

## 2015-08-27 NOTE — Addendum Note (Signed)
Addended by: Wilfrid Lund on: 08/27/2015 01:53 PM   Modules accepted: Orders

## 2015-09-04 ENCOUNTER — Telehealth: Payer: Self-pay

## 2015-09-04 NOTE — Telephone Encounter (Signed)
UDS: 08/25/2015  Negative for Hydrocodone:PRN   Low risk per Dr. Larose Kells 09/04/2015

## 2015-09-10 ENCOUNTER — Other Ambulatory Visit: Payer: Self-pay | Admitting: Internal Medicine

## 2015-09-11 ENCOUNTER — Other Ambulatory Visit: Payer: Self-pay

## 2015-09-11 MED ORDER — LEVOTHYROXINE SODIUM 150 MCG PO TABS: ORAL_TABLET | ORAL | Status: DC

## 2015-09-11 MED ORDER — HYDROCHLOROTHIAZIDE 25 MG PO TABS
25.0000 mg | ORAL_TABLET | Freq: Every day | ORAL | Status: DC
Start: 2015-09-11 — End: 2016-05-12

## 2015-09-15 NOTE — Telephone Encounter (Signed)
Spoke with Tim at [facility]. Informed that they did receive HCTZ and Synthroid on 09/11/2015. Informed me that Pt's insurance denying refills due to being 1 month early on HCTZ and 2-3 weeks early on Synthroid. Informed Pt via MyChart and informed her to call her insurance provider if refills are needed.

## 2015-10-29 ENCOUNTER — Telehealth: Payer: Self-pay | Admitting: Internal Medicine

## 2015-10-29 NOTE — Telephone Encounter (Signed)
Due for a TSH, please arrange 

## 2015-10-30 NOTE — Telephone Encounter (Signed)
Letter printed and mailed to Pt.  

## 2015-11-18 ENCOUNTER — Ambulatory Visit (INDEPENDENT_AMBULATORY_CARE_PROVIDER_SITE_OTHER): Payer: BLUE CROSS/BLUE SHIELD | Admitting: Internal Medicine

## 2015-11-18 ENCOUNTER — Encounter: Payer: Self-pay | Admitting: Internal Medicine

## 2015-11-18 VITALS — BP 122/66 | HR 82 | Temp 98.3°F | Ht 63.0 in | Wt 145.4 lb

## 2015-11-18 DIAGNOSIS — J209 Acute bronchitis, unspecified: Secondary | ICD-10-CM

## 2015-11-18 DIAGNOSIS — M8949 Other hypertrophic osteoarthropathy, multiple sites: Secondary | ICD-10-CM

## 2015-11-18 DIAGNOSIS — Z09 Encounter for follow-up examination after completed treatment for conditions other than malignant neoplasm: Secondary | ICD-10-CM

## 2015-11-18 DIAGNOSIS — M150 Primary generalized (osteo)arthritis: Secondary | ICD-10-CM | POA: Diagnosis not present

## 2015-11-18 DIAGNOSIS — E039 Hypothyroidism, unspecified: Secondary | ICD-10-CM

## 2015-11-18 DIAGNOSIS — M159 Polyosteoarthritis, unspecified: Secondary | ICD-10-CM

## 2015-11-18 DIAGNOSIS — I10 Essential (primary) hypertension: Secondary | ICD-10-CM

## 2015-11-18 MED ORDER — HYDROCODONE-ACETAMINOPHEN 5-325 MG PO TABS: 1.0000 | ORAL_TABLET | Freq: Four times a day (QID) | ORAL | Status: DC | PRN

## 2015-11-18 MED ORDER — AZITHROMYCIN 250 MG PO TABS: ORAL_TABLET | ORAL | Status: DC

## 2015-11-18 NOTE — Progress Notes (Signed)
Pre visit review using our clinic review tool, if applicable. No additional management support is needed unless otherwise documented below in the visit note. 

## 2015-11-18 NOTE — Patient Instructions (Signed)
Get your blood work before you leave    Rest, fluids , tylenol  For cough: Take Mucinex DM twice a day as needed until better  For nasal congestion Use OTC Nasocort or Flonase : 2 nasal sprays on each side of the nose daily until you feel better   Use hydrocodone for both pain and cough suppression  Take the antibiotic as prescribed  (Zithromax)  Call if not gradually better over the next  10 days  Call anytime if the symptoms are severe    Next visit  for a    complete physical exam in 4-6 months, fasting  Please schedule an appointment at the front desk

## 2015-11-18 NOTE — Progress Notes (Signed)
Subjective:    Patient ID: Sandra Owen, female    DOB: 10-27-1951, 64 y.o.   MRN: [id number]  DOS:  11/18/2015 Type of visit - description : Routine visit Interval history: HTN: Good compliance w/ medication, no apparent side effects Hypothyroidism: on synthroid, reports good compliance. Also, developed runny nose and watery eyes 2 weeks ago, symptoms definitely increased for the last 4-5 days: Nasal congestion, moderate frontal headache, sputum production >>greenish.   Review of Systems  Denies chills, some low-grade fever on and off. Nausea, no vomiting or diarrhea No chest pain or difficulty breathing.  Past Medical History  Diagnosis Date  . Hypertension   . Hypothyroidism   . Allergic rhinitis   . Anxiety and depression     not currently  . PONV (postoperative nausea and vomiting)     Past Surgical History  Procedure Laterality Date  . Tubal ligation    . Tonsillectomy    . Bunionectomy  1999  . Vaginal hysterectomy N/A 06/25/2013    Procedure: HYSTERECTOMY VAGINAL;  Surgeon: Gus Height, MD;  Location: [facility];  Service: Gynecology;  Laterality: N/A;  . Anterior and posterior repair N/A 06/25/2013    Procedure: ANTERIOR (CYSTOCELE) AND POSTERIOR REPAIR (RECTOCELE);  Surgeon: Gus Height, MD;  Location: [facility];  Service: Gynecology;  Laterality: N/A;    Social History   Social History  . Marital Status: Married    Spouse Name: N/A  . Number of Children: 1  . Years of Education: N/A   Occupational History  . sales     Social History Main Topics  . Smoking status: Never Smoker   . Smokeless tobacco: Never Used  . Alcohol Use: Yes     Comment: socially  . Drug Use: No  . Sexual Activity: Not on file   Other Topics Concern  . Not on file   Social History Narrative        Medication List       This list is accurate as of: 11/18/15 11:59 PM.  Always use your most recent med list.               albuterol 108 (90 BASE) MCG/ACT inhaler  Commonly  known as:  VENTOLIN HFA  Inhale 2 puffs into the lungs every 6 (six) hours as needed for wheezing or shortness of breath.     azelastine 0.05 % ophthalmic solution  Commonly known as:  OPTIVAR  Place 1 drop into the left eye 2 (two) times daily.     azithromycin 250 MG tablet  Commonly known as:  ZITHROMAX Z-PAK  2 tabs a day the first day, then 1 tab a day x 4 days     fluticasone 50 MCG/ACT nasal spray  Commonly known as:  FLONASE  Place 2 sprays into the nose daily as needed for rhinitis or allergies.     hydrochlorothiazide 25 MG tablet  Commonly known as:  HYDRODIURIL  Take 1 tablet (25 mg total) by mouth daily.     HYDROcodone-acetaminophen 5-325 MG tablet  Commonly known as:  NORCO  Take 1 tablet by mouth every 6 (six) hours as needed for moderate pain.     levothyroxine 150 MCG tablet  Commonly known as:  SYNTHROID, LEVOTHROID  Take 1 tablet by mouth daily except on Wednesday's take only 1/2 tablet.     SYSTANE OP  Apply to eye.           Objective:   Physical Exam BP 122/66  mmHg  Pulse 82  Temp(Src) 98.3 F (36.8 C) (Oral)  Ht 5\' 3"  (1.6 m)  Wt 145 lb 6 oz (65.942 kg)  BMI 25.76 kg/m2  SpO2 97% General:   Well developed, well nourished . NAD.  HEENT:  Normocephalic . Face symmetric, atraumatic. TMs: Obscured by wax on the right, normal on the left. Throat: Symmetric, no red. Some postnasal dripping noted. Nose: Congested Sinuses not TTP Lungs:  CTA B Normal respiratory effort, no intercostal retractions, no accessory muscle use. Heart: RRR,  no murmur.  No pretibial edema bilaterally  Skin: Not pale. Not jaundice Neurologic:  alert & oriented X3.  Speech normal, gait appropriate for age and unassisted Psych--  Cognition and judgment appear intact.  Cooperative with normal attention span and concentration.  Behavior appropriate. No anxious or depressed appearing.      Assessment & Plan:   Assessment > HTN Hypothyroidism DJD-- on  hydrocodone H/o Anxiety depression  PLAN HTN: Continue HCTZ, check a BMP. BP today very good Hypothyroidism: Due for a TSH. DJD, RF hydrocodone Bronchitis: Z-Pak, see instructions. RTC 4-5 months, physical exam

## 2015-11-19 DIAGNOSIS — Z09 Encounter for follow-up examination after completed treatment for conditions other than malignant neoplasm: Secondary | ICD-10-CM | POA: Insufficient documentation

## 2015-11-19 LAB — BASIC METABOLIC PANEL
BUN: 21 mg/dL (ref 6–23)
CO2: 28 mEq/L (ref 19–32)
Calcium: 10.1 mg/dL (ref 8.4–10.5)
Chloride: 103 mEq/L (ref 96–112)
Creatinine, Ser: 1.03 mg/dL (ref 0.40–1.20)
GFR: 57.5 mL/min — ABNORMAL LOW (ref >60.00)
Glucose, Bld: 86 mg/dL (ref 70–99)
Potassium: 3.6 mEq/L (ref 3.5–5.1)
Sodium: 142 mEq/L (ref 135–145)

## 2015-11-19 LAB — TSH: TSH: 0.24 u[IU]/mL — ABNORMAL LOW (ref 0.35–4.50)

## 2015-11-19 NOTE — Assessment & Plan Note (Signed)
HTN: Continue HCTZ, check a BMP. BP today very good Hypothyroidism: Due for a TSH. DJD, RF hydrocodone Bronchitis: Z-Pak, see instructions. RTC 4-5 months, physical exam

## 2015-11-23 ENCOUNTER — Other Ambulatory Visit: Payer: Self-pay | Admitting: Internal Medicine

## 2015-11-24 ENCOUNTER — Telehealth: Payer: Self-pay | Admitting: Internal Medicine

## 2015-11-24 NOTE — Telephone Encounter (Signed)
Caller name: [place]   Relationship to patient: Self   Can be reached: [phone]  [id number] [place] [place]  Reason for call: pt is requesting a refill  ZITHROMAX Z-PAK

## 2015-11-24 NOTE — Telephone Encounter (Signed)
Have received MyChart message from Pt, and was forwarded to Dr. Larose Kells. Awaiting response.

## 2015-11-24 NOTE — Telephone Encounter (Signed)
Don't recommend further antibiotics, continue with conservative treatment with Flonase, Mucinex. Give it few more days

## 2015-11-24 NOTE — Telephone Encounter (Signed)
Pt informed via MyChart

## 2016-01-04 ENCOUNTER — Encounter: Payer: Self-pay | Admitting: Internal Medicine

## 2016-01-04 DIAGNOSIS — E039 Hypothyroidism, unspecified: Secondary | ICD-10-CM

## 2016-01-05 NOTE — Telephone Encounter (Signed)
TSH ordered.

## 2016-01-12 ENCOUNTER — Ambulatory Visit (INDEPENDENT_AMBULATORY_CARE_PROVIDER_SITE_OTHER): Payer: BLUE CROSS/BLUE SHIELD | Admitting: Family Medicine

## 2016-01-12 ENCOUNTER — Encounter: Payer: Self-pay | Admitting: Internal Medicine

## 2016-01-12 ENCOUNTER — Telehealth: Payer: Self-pay | Admitting: Internal Medicine

## 2016-01-12 VITALS — BP 152/86 | HR 79 | Temp 98.7°F | Resp 16 | Ht 62.75 in | Wt 150.4 lb

## 2016-01-12 DIAGNOSIS — B029 Zoster without complications: Secondary | ICD-10-CM | POA: Diagnosis not present

## 2016-01-12 MED ORDER — VALACYCLOVIR HCL 1 G PO TABS: 1000.0000 mg | ORAL_TABLET | Freq: Three times a day (TID) | ORAL | Status: DC

## 2016-01-12 MED ORDER — HYDROCODONE-ACETAMINOPHEN 5-325 MG PO TABS: 1.0000 | ORAL_TABLET | Freq: Four times a day (QID) | ORAL | Status: DC | PRN

## 2016-01-12 NOTE — Telephone Encounter (Signed)
Patient Name: Sandra Owen  DOB: 1951-10-13    Initial Comment Caller states I think I have shingles    Nurse Assessment  Nurse: Thad Ranger, RN, Denise Date/Time (Eastern Time): 01/12/2016 5:04:46 PM  Confirm and document reason for call. If symptomatic, describe symptoms. You must click the next button to save text entered. ---Pt thinks she may have Shingles with a rash on lower back that is blistered, itching, slight pain. Rash started last night but worse today.  Has the patient traveled out of the country within the last 30 days? ---Not Applicable  Does the patient have any new or worsening symptoms? ---Yes  Will a triage be completed? ---Yes  Related visit to physician within the last 2 weeks? ---No  Does the PT have any chronic conditions? (i.e. diabetes, asthma, etc.) ---No  Is this a behavioral health or substance abuse call? ---No     Guidelines    Guideline Title Affirmed Question Affirmed Notes  Shingles [1] Shingles rash (matches SYMPTOMS) AND [2] onset within past 72 hours    Final Disposition User   See Physician within 24 Hours Carmon, RN, [employer]    Referrals  [facility]   Disagree/Comply: Comply

## 2016-01-12 NOTE — Progress Notes (Signed)
By signing my name below, I, Moises Blood, attest that this documentation has been prepared under the direction and in the presence of Robyn Haber, MD. Electronically Signed: Moises Blood, Villanueva. 01/12/2016 , 6:00 PM .  Patient was seen in room 8 .   Patient ID: Sandra Owen MRN: [id number], DOB: Aug 03, 1951, 65 y.o. Date of Encounter: 01/12/2016  Primary Physician: Kathlene November, MD  Chief Complaint:  Chief Complaint  Patient presents with  . Herpes Zoster    Possible shingles x 3 days  . Headache    x 1 day  . Diarrhea    x 1 day    HPI:  Sandra Owen is a 65 y.o. female who presents to [facility] complaining of possible shingles on her back that started about 3 days ago. There's small itchy and burning patches. She used to have shingles around the ribs on the front 16 years ago. It kept her awake at night and also states having headache and diarrhea that started yesterday.   She works in an office.   Past Medical History  Diagnosis Date  . Hypertension   . Hypothyroidism   . Allergic rhinitis   . Anxiety and depression     not currently  . PONV (postoperative nausea and vomiting)      Home Meds: Prior to Admission medications   Medication Sig Start Date End Date Taking? Authorizing Provider  levothyroxine (SYNTHROID, LEVOTHROID) 150 MCG tablet Take 1 tablet by mouth daily except on Wednesday's take only 1/2 tablet. 09/11/15  Yes Colon Branch, MD  albuterol (VENTOLIN HFA) 108 (90 BASE) MCG/ACT inhaler Inhale 2 puffs into the lungs every 6 (six) hours as needed for wheezing or shortness of breath. Patient not taking: Reported on 03/16/2015 06/10/14   Colon Branch, MD  azelastine (OPTIVAR) 0.05 % ophthalmic solution Place 1 drop into the left eye 2 (two) times daily. Patient not taking: Reported on 11/18/2015 09/03/13   Rosalita Chessman, DO  azithromycin (ZITHROMAX Z-PAK) 250 MG tablet 2 tabs a day the first day, then 1 tab a day x 4 days Patient not  taking: Reported on 01/12/2016 11/18/15   Colon Branch, MD  fluticasone [facility]) 50 MCG/ACT nasal spray Place 2 sprays into the nose daily as needed for rhinitis or allergies. Reported on 01/12/2016    Historical Provider, MD  hydrochlorothiazide (HYDRODIURIL) 25 MG tablet Take 1 tablet (25 mg total) by mouth daily. Patient not taking: Reported on 01/12/2016 09/11/15   Colon Branch, MD  HYDROcodone-acetaminophen [facility]) 5-325 MG tablet Take 1 tablet by mouth every 6 (six) hours as needed for moderate pain. Patient not taking: Reported on 01/12/2016 11/18/15   Colon Branch, MD  Polyethyl Glycol-Propyl Glycol (SYSTANE OP) Apply to eye. Reported on 01/12/2016    Historical Provider, MD    Allergies:  Allergies  Allergen Reactions  . Codeine Nausea Only  . Tape Other (See Comments)    Dermatitis    Social History   Social History  . Marital Status: Married    Spouse Name: N/A  . Number of Children: 1  . Years of Education: N/A   Occupational History  . sales     Social History Main Topics  . Smoking status: Never Smoker   . Smokeless tobacco: Never Used  . Alcohol Use: Yes     Comment: socially  . Drug Use: No  . Sexual Activity: Not on file   Other Topics Concern  .  Not on file   Social History Narrative     Review of Systems: Constitutional: negative for fever, chills, night sweats, weight changes, or fatigue  HEENT: negative for vision changes, hearing loss, congestion, rhinorrhea, ST, epistaxis, or sinus pressure Cardiovascular: negative for chest pain or palpitations Respiratory: negative for hemoptysis, wheezing, shortness of breath, or cough Abdominal: negative for abdominal pain, nausea, vomiting, or constipation; positive for diarrhea Dermatological: positive for rash Neurologic: negative for dizziness, or syncope; positive for headache All other systems reviewed and are otherwise negative with the exception to those above and in the HPI.  Physical Exam: Blood pressure  152/86, pulse 79, temperature 98.7 F (37.1 C), temperature source Oral, resp. rate 16, height 5' 2.75" (1.594 m), weight 150 lb 6.4 oz (68.221 kg), SpO2 99 %., Body mass index is 26.85 kg/(m^2). General: Well developed, well nourished, in no acute distress. Head: Normocephalic, atraumatic, eyes without discharge, sclera non-icteric, nares are without discharge. Bilateral auditory canals clear, TM's are without perforation, pearly grey and translucent with reflective cone of light bilaterally. Oral cavity moist, posterior pharynx without exudate, erythema, peritonsillar abscess, or post nasal drip.  Neck: Supple. No thyromegaly. Full ROM. No lymphadenopathy. Lungs: Clear bilaterally to auscultation without wheezes, rales, or rhonchi. Breathing is unlabored. Heart: RRR with S1 S2. No murmurs, rubs, or gallops appreciated. Abdomen: Soft, non-tender, non-distended with normoactive bowel sounds. No hepatomegaly. No rebound/guarding. No obvious abdominal masses.  Msk:  Strength and tone normal for age.  Extremities/Skin: Warm and dry. Vesicular rash over right flank consistent with shingles Neuro: Alert and oriented X 3. Moves all extremities spontaneously. Gait is normal. CNII-XII grossly in tact. Psych:  Responds to questions appropriately with a normal affect.   Labs:  ASSESSMENT AND PLAN:  65 y.o. year old female with  This chart was scribed in my presence and reviewed by me personally.    ICD-9-CM ICD-10-CM   1. Shingles 053.9 B02.9 HYDROcodone-acetaminophen (NORCO) 5-325 MG tablet     valACYclovir (VALTREX) 1000 MG tablet     Care order/instruction     Signed, Robyn Haber, MD 01/12/2016 6:00 PM

## 2016-01-12 NOTE — Telephone Encounter (Signed)
Scheduled 01/13/16 with Dr.Paz.     Sandra Owen

## 2016-01-12 NOTE — Patient Instructions (Signed)

## 2016-01-13 ENCOUNTER — Ambulatory Visit: Payer: BLUE CROSS/BLUE SHIELD | Admitting: Internal Medicine

## 2016-01-13 NOTE — Telephone Encounter (Signed)
thx

## 2016-01-19 ENCOUNTER — Telehealth: Payer: Self-pay | Admitting: Internal Medicine

## 2016-01-19 NOTE — Telephone Encounter (Signed)
Patient is due for a TSH, please arrange alab visit. Order in already.

## 2016-02-11 ENCOUNTER — Other Ambulatory Visit: Payer: Self-pay | Admitting: Family Medicine

## 2016-02-15 NOTE — Telephone Encounter (Signed)
LVM advising patient to schedule lab only

## 2016-02-16 ENCOUNTER — Encounter: Payer: Self-pay | Admitting: Internal Medicine

## 2016-02-16 DIAGNOSIS — B029 Zoster without complications: Secondary | ICD-10-CM

## 2016-02-16 MED ORDER — HYDROCODONE-ACETAMINOPHEN 5-325 MG PO TABS: 1.0000 | ORAL_TABLET | Freq: Four times a day (QID) | ORAL | Status: DC | PRN

## 2016-02-16 NOTE — Telephone Encounter (Signed)
Okay #90, 2 prescriptions 

## 2016-02-16 NOTE — Telephone Encounter (Signed)
Pt is requesting refill on Hydrocodone.  Last OV: 11/18/2015 Last Fill: 01/12/2016 #90 and 0RF by Dr. Joseph Art UDS: 08/25/2015 Low risk  Please advise.

## 2016-02-16 NOTE — Telephone Encounter (Signed)
Rx's placed at front desk for pick up at Pt's convenience. Pt informed via MyChart.

## 2016-02-16 NOTE — Telephone Encounter (Signed)
Rx for March and April 2017 printed, awaiting MD signature.

## 2016-02-17 ENCOUNTER — Other Ambulatory Visit (INDEPENDENT_AMBULATORY_CARE_PROVIDER_SITE_OTHER): Payer: Self-pay

## 2016-02-17 DIAGNOSIS — E039 Hypothyroidism, unspecified: Secondary | ICD-10-CM

## 2016-02-17 LAB — TSH: TSH: 26.1 u[IU]/mL — ABNORMAL HIGH (ref 0.35–4.50)

## 2016-02-18 ENCOUNTER — Telehealth: Payer: Self-pay | Admitting: Internal Medicine

## 2016-02-18 NOTE — Telephone Encounter (Signed)
°  Relation to [id number] Call back number:[phone] [id number] [place], [place] [phone] (Phone) [phone] (Fax)         Reason for call:  Patient returning your call regarding lab results patient states you can leave a detailed message and if its pertaining to a prescription she is requesting a 90 day supply

## 2016-02-18 NOTE — Telephone Encounter (Signed)
Notes Recorded by Damita Dunnings, CMA on 02/18/2016 at 4:09 PM Spoke with Sandra Owen, she informed me that last time she was in office, she was informed by MD to take Synthroid only 4-5 days out of the week instead of daily, which she states she has been doing. Informed her that I would inform MD and call back w/ new dosage. Sandra Owen verbalized understanding. Notes Recorded by Damita Dunnings, CMA on 02/18/2016 at 2:08 PM [facility] informing Sandra Owen to return call regarding lab results.  Notes Recorded by Colon Branch, MD on 02/18/2016 at 1:47 PM Please call the patient, thyroid levels are extremity low, is she taking Synthroid at all? Let me know

## 2016-02-19 MED ORDER — LEVOTHYROXINE SODIUM 125 MCG PO TABS: 125.0000 ug | ORAL_TABLET | Freq: Every day | ORAL | Status: DC

## 2016-02-19 NOTE — Addendum Note (Signed)
Addended by: Damita Dunnings D on: 02/19/2016 01:35 PM   Modules accepted: Orders, Medications

## 2016-03-21 ENCOUNTER — Other Ambulatory Visit: Payer: Self-pay | Admitting: Internal Medicine

## 2016-03-24 ENCOUNTER — Other Ambulatory Visit: Payer: Self-pay | Admitting: Internal Medicine

## 2016-04-28 ENCOUNTER — Telehealth: Payer: Self-pay

## 2016-04-28 NOTE — Telephone Encounter (Signed)
MyChart message sent. TSH ordered.

## 2016-04-28 NOTE — Telephone Encounter (Signed)
-----   Message from Colon Branch, MD sent at 04/28/2016  4:36 PM EDT ----- Regarding: TSH Send the patient a message, she is due for a TSH, needs labs. Enter a future order.

## 2016-04-30 ENCOUNTER — Other Ambulatory Visit: Payer: Self-pay | Admitting: Internal Medicine

## 2016-05-01 ENCOUNTER — Other Ambulatory Visit: Payer: Self-pay | Admitting: Internal Medicine

## 2016-05-06 ENCOUNTER — Encounter: Payer: Self-pay | Admitting: Internal Medicine

## 2016-05-06 ENCOUNTER — Ambulatory Visit (INDEPENDENT_AMBULATORY_CARE_PROVIDER_SITE_OTHER): Payer: BLUE CROSS/BLUE SHIELD | Admitting: Internal Medicine

## 2016-05-06 VITALS — BP 118/76 | HR 70 | Temp 98.6°F | Ht 63.0 in | Wt 147.0 lb

## 2016-05-06 DIAGNOSIS — Z0000 Encounter for general adult medical examination without abnormal findings: Secondary | ICD-10-CM

## 2016-05-06 DIAGNOSIS — E039 Hypothyroidism, unspecified: Secondary | ICD-10-CM

## 2016-05-06 DIAGNOSIS — Z114 Encounter for screening for human immunodeficiency virus [HIV]: Secondary | ICD-10-CM

## 2016-05-06 DIAGNOSIS — B029 Zoster without complications: Secondary | ICD-10-CM

## 2016-05-06 DIAGNOSIS — Z9109 Other allergy status, other than to drugs and biological substances: Secondary | ICD-10-CM

## 2016-05-06 DIAGNOSIS — Z09 Encounter for follow-up examination after completed treatment for conditions other than malignant neoplasm: Secondary | ICD-10-CM

## 2016-05-06 LAB — CBC WITH DIFFERENTIAL/PLATELET
Basophils Absolute: 0 10*3/uL (ref 0.0–0.1)
Basophils Relative: 0.9 % (ref 0.0–3.0)
Eosinophils Absolute: 0.3 10*3/uL (ref 0.0–0.7)
Eosinophils Relative: 7.1 % — ABNORMAL HIGH (ref 0.0–5.0)
HCT: 39.5 % (ref 36.0–46.0)
Hemoglobin: 13.5 g/dL (ref 12.0–15.0)
Lymphocytes Relative: 31.9 % (ref 12.0–46.0)
Lymphs Abs: 1.4 10*3/uL (ref 0.7–4.0)
MCHC: 34.1 g/dL (ref 30.0–36.0)
MCV: 92.8 fl (ref 78.0–100.0)
Monocytes Absolute: 0.4 10*3/uL (ref 0.1–1.0)
Monocytes Relative: 8.8 % (ref 3.0–12.0)
Neutro Abs: 2.3 10*3/uL (ref 1.4–7.7)
Neutrophils Relative %: 51.3 % (ref 43.0–77.0)
Platelets: 234 10*3/uL (ref 150.0–400.0)
RBC: 4.25 Mil/uL (ref 3.87–5.11)
RDW: 12.5 % (ref 11.5–15.5)
WBC: 4.4 10*3/uL (ref 4.0–10.5)

## 2016-05-06 LAB — LIPID PANEL
Cholesterol: 188 mg/dL (ref 0–200)
HDL: 50.7 mg/dL (ref >39.00)
LDL Cholesterol: 121 mg/dL — ABNORMAL HIGH (ref 0–99)
NonHDL: 136.8
Total CHOL/HDL Ratio: 4
Triglycerides: 77 mg/dL (ref 0.0–149.0)
VLDL: 15.4 mg/dL (ref 0.0–40.0)

## 2016-05-06 LAB — TSH: TSH: 0.07 u[IU]/mL — ABNORMAL LOW (ref 0.35–4.50)

## 2016-05-06 LAB — HIV ANTIBODY (ROUTINE TESTING W REFLEX): HIV 1&2 Ab, 4th Generation: NONREACTIVE

## 2016-05-06 LAB — HEMOGLOBIN A1C: Hgb A1c MFr Bld: 5.4 % (ref 4.6–6.5)

## 2016-05-06 MED ORDER — CLONAZEPAM 0.5 MG PO TABS: 0.5000 mg | ORAL_TABLET | Freq: Two times a day (BID) | ORAL | Status: DC | PRN

## 2016-05-06 MED ORDER — HYDROCODONE-ACETAMINOPHEN 5-325 MG PO TABS
1.0000 | ORAL_TABLET | Freq: Four times a day (QID) | ORAL | Status: DC | PRN
Start: 2016-05-06 — End: 2016-11-15

## 2016-05-06 MED ORDER — HYDROCODONE-ACETAMINOPHEN 5-325 MG PO TABS: 1.0000 | ORAL_TABLET | Freq: Four times a day (QID) | ORAL | Status: DC | PRN

## 2016-05-06 NOTE — Assessment & Plan Note (Addendum)
Td 2011 Prevnar -2015 ? (suspect documentation of shot is a clerical error, she was here for a sick visit)  Diagnosed with shingles 12-2015, fully recovered. CCS: not ready for a  Colonoscopy d/t finances/other issues, we agreed on an IFOB Sees gynecology regularly, Dr Harrington Challenger,  last OV ~ 07-2015 per pt  MMG 2014 , rec to check one  Diet exercise discussed

## 2016-05-06 NOTE — Progress Notes (Signed)
Pre visit review using our clinic review tool, if applicable. No additional management support is needed unless otherwise documented below in the visit note. 

## 2016-05-06 NOTE — Patient Instructions (Signed)
GO TO THE LAB : Get the blood work     GO TO THE FRONT DESK Schedule your next appointment for a  Check up in 6 months  Take Synthroid daily, will refill for 90 days when the results come back

## 2016-05-06 NOTE — Progress Notes (Signed)
Subjective:    Patient ID: Sandra Owen, female    DOB: 1951/05/10, 65 y.o.   MRN: [id number]  DOS:  05/06/2016 Type of visit - description : CPX Interval history: Needs some medications refilled, otherwise feeling okay    Review of Systems Constitutional: No fever. No chills. No unexplained wt changes. No unusual sweats  HEENT: No dental problems, no ear discharge, no facial swelling, no voice changes. No eye discharge, no eye  redness , no  intolerance to light   Respiratory: No wheezing , no  difficulty breathing. No cough , no mucus production  Cardiovascular: No CP, no leg swelling , no  Palpitations  GI: no nausea, no vomiting, no diarrhea , no  abdominal pain.  No blood in the stools. No dysphagia, no odynophagia    Endocrine: No polyphagia, no polyuria , no polydipsia  GU: No dysuria, gross hematuria, difficulty urinating. No urinary urgency, no frequency.  Musculoskeletal: No joint swellings or unusual aches or pains  Skin: No change in the color of the skin, palor , no  Rash  Allergic, immunologic: + environmental allergies not well-controlled with OTCs , no  food allergies  Neurological: No dizziness no  syncope. No headaches. No diplopia, no slurred, no slurred speech, no motor deficits, no facial  Numbness  Hematological: No enlarged lymph nodes, no easy bruising , no unusual bleedings  Psychiatry: No suicidal ideas, no hallucinations, no beavior problems, no confusion.  Her husband lost his job,+ stress, medication?  Past Medical History  Diagnosis Date  . Hypertension   . Hypothyroidism   . Allergic rhinitis   . Anxiety and depression     not currently  . PONV (postoperative nausea and vomiting)     Past Surgical History  Procedure Laterality Date  . Tubal ligation    . Tonsillectomy    . Bunionectomy  1999  . Vaginal hysterectomy N/A 06/25/2013    Procedure: HYSTERECTOMY VAGINAL;  Surgeon: Gus Height, MD;  Location: [facility];  Service:  Gynecology;  Laterality: N/A;  . Anterior and posterior repair N/A 06/25/2013    Procedure: ANTERIOR (CYSTOCELE) AND POSTERIOR REPAIR (RECTOCELE);  Surgeon: Gus Height, MD;  Location: [facility];  Service: Gynecology;  Laterality: N/A;    Social History   Social History  . Marital Status: Married    Spouse Name: N/A  . Number of Children: 1  . Years of Education: N/A   Occupational History  . sales / customer service    Social History Main Topics  . Smoking status: Never Smoker   . Smokeless tobacco: Never Used  . Alcohol Use: Yes     Comment: socially  . Drug Use: No  . Sexual Activity: Not on file   Other Topics Concern  . Not on file   Social History Narrative   Lives w/ husband     Family History  Problem Relation Age of Onset  . Colon cancer Neg Hx   . Breast cancer Neg Hx   . Pancreatic cancer Father   . Cancer Father   . Hyperlipidemia Mother   . Hypertension Mother   . Heart disease Brother     age 41       Medication List       This list is accurate as of: 05/06/16 11:59 PM.  Always use your most recent med list.               albuterol 108 (90 Base) MCG/ACT inhaler  Commonly known  as:  VENTOLIN HFA  Inhale 2 puffs into the lungs every 6 (six) hours as needed for wheezing or shortness of breath.     azelastine 0.05 % ophthalmic solution  Commonly known as:  OPTIVAR  Place 1 drop into the left eye 2 (two) times daily.     clonazePAM 0.5 MG tablet  Commonly known as:  KLONOPIN  Take 1 tablet (0.5 mg total) by mouth 2 (two) times daily as needed for anxiety.     fluticasone 50 MCG/ACT nasal spray  Commonly known as:  FLONASE  Place 2 sprays into the nose daily as needed for rhinitis or allergies. Reported on 05/06/2016     hydrochlorothiazide 25 MG tablet  Commonly known as:  HYDRODIURIL  Take 1 tablet (25 mg total) by mouth daily.     HYDROcodone-acetaminophen 5-325 MG tablet  Commonly known as:  NORCO  Take 1 tablet by mouth every 6 (six)  hours as needed for moderate pain.     HYDROcodone-acetaminophen 5-325 MG tablet  Commonly known as:  NORCO  Take 1 tablet by mouth every 6 (six) hours as needed for moderate pain.     levothyroxine 125 MCG tablet  Commonly known as:  SYNTHROID, LEVOTHROID  Take 1 tablet (125 mcg total) by mouth daily before breakfast.           Objective:   Physical Exam BP 118/76 mmHg  Pulse 70  Temp(Src) 98.6 F (37 C) (Oral)  Ht 5\' 3"  (1.6 m)  Wt 147 lb (66.679 kg)  BMI 26.05 kg/m2  SpO2 97%  General:   Well developed, well nourished . NAD.  Neck: No  thyromegaly  HEENT:  Normocephalic . Face symmetric, atraumatic Lungs:  CTA B Normal respiratory effort, no intercostal retractions, no accessory muscle use. Heart: RRR,  no murmur.  No pretibial edema bilaterally  Abdomen:  Not distended, soft, non-tender. No rebound or rigidity.   Skin: Exposed areas without rash. Not pale. Not jaundice Neurologic:  alert & oriented X3.  Speech normal, gait appropriate for age and unassisted Strength symmetric and appropriate for age.  Psych: Cognition and judgment appear intact.  Cooperative with normal attention span and concentration.  Behavior appropriate. No anxious or depressed appearing.    Assessment & Plan:   Assessment > HTN Hypothyroidism DJD--shoulder, neck,  on hydrocodone H/o Anxiety depression Shingles 12-2015   PLAN Hypothyroidism: On Synthroid 125 every day, check a TSH, refill 90 days with results. DJD: Refill hydrocodone Allergies: Not well-controlled with OTCs, likes to see an allergist as previously she had shots. Will refer Anxiety: Her spouse lost his job, having financial issues, + anxiety, no depression, we discussed prn vs qd medication, elected as needed, Rx clonazepam, side effects discussed. RTC 6 months

## 2016-05-07 NOTE — Assessment & Plan Note (Signed)
Hypothyroidism: On Synthroid 125 every day, check a TSH, refill 90 days with results. DJD: Refill hydrocodone Allergies: Not well-controlled with OTCs, likes to see an allergist as previously she had shots. Will refer Anxiety: Her spouse lost his job, having financial issues, + anxiety, no depression, we discussed prn vs qd medication, elected as needed, Rx clonazepam, side effects discussed. RTC 6 months

## 2016-05-09 MED ORDER — LEVOTHYROXINE SODIUM 112 MCG PO TABS: 112.0000 ug | ORAL_TABLET | Freq: Every day | ORAL | Status: DC

## 2016-05-09 NOTE — Addendum Note (Signed)
Addended byDamita Dunnings D on: 05/09/2016 09:19 AM   Modules accepted: Orders

## 2016-05-09 NOTE — Addendum Note (Signed)
Addended by: Damita Dunnings D on: 05/09/2016 09:18 AM   Modules accepted: Orders, Medications

## 2016-05-12 ENCOUNTER — Other Ambulatory Visit: Payer: Self-pay | Admitting: Internal Medicine

## 2016-06-12 ENCOUNTER — Other Ambulatory Visit: Payer: Self-pay | Admitting: Internal Medicine

## 2016-06-13 NOTE — Telephone Encounter (Signed)
Pt is requesting refill on Clonazepam.  Last OV: 05/06/2016 Last Fill: 05/06/2016 #30 and 1RF UDS: 08/25/2015 Low risk  Please advise.

## 2016-06-13 NOTE — Telephone Encounter (Signed)
Rx denied

## 2016-06-13 NOTE — Telephone Encounter (Signed)
Isn't it a little early, she should have one refill left

## 2016-06-15 ENCOUNTER — Encounter: Payer: Self-pay | Admitting: Internal Medicine

## 2016-06-16 ENCOUNTER — Other Ambulatory Visit: Payer: Self-pay | Admitting: Internal Medicine

## 2016-06-16 NOTE — Telephone Encounter (Signed)
Pt is requesting refill on Clonazepam.  Last OV: 05/06/2016 Last Fill: 05/06/2016 #30 and 1RF Pt sig: 1 tablet BID PRN UDS: 08/25/2015 Low risk  Please advise.

## 2016-06-17 MED ORDER — CLONAZEPAM 0.5 MG PO TABS: 0.5000 mg | ORAL_TABLET | Freq: Two times a day (BID) | ORAL | Status: DC | PRN

## 2016-06-17 NOTE — Telephone Encounter (Signed)
[facility]  

## 2016-06-17 NOTE — Telephone Encounter (Signed)
Per Dr. Larose Kells, okay to fill x2. Rx printed, awaiting MD signature.

## 2016-07-04 IMAGING — CR DG ABDOMEN 1V
1 series · 1 of 1 positions shown · non-contrast
Comparison: None.

CLINICAL DATA: Left-sided flank pain for 3 days with hematuria
(unknown if microscopic or macroscopic). Initial encounter.

EXAM:
ABDOMEN - 1 VIEW

[t abdomen supine]
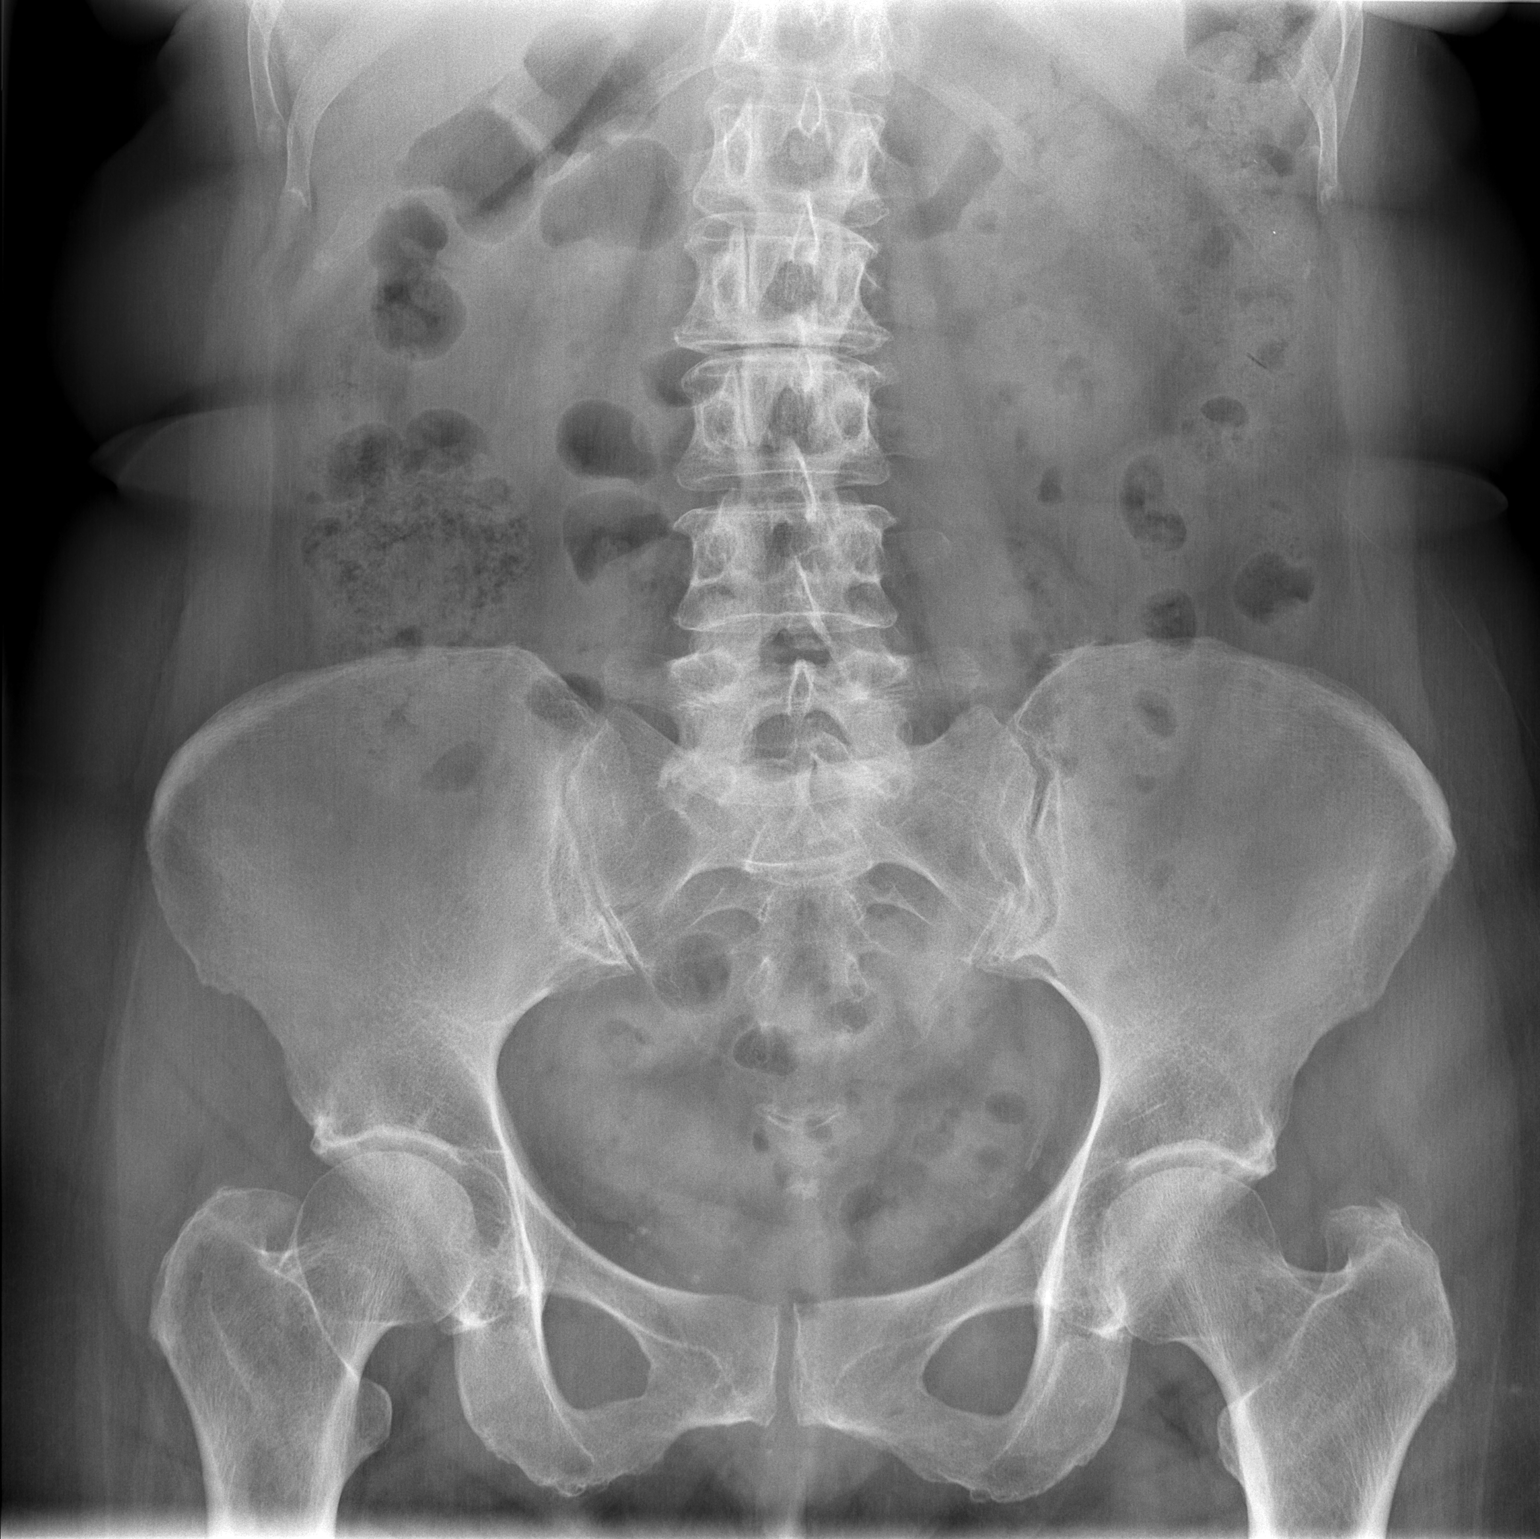

[1 of 1 positions shown; findings below may reference images not displayed]

FINDINGS: No convincing urolithiasis. Pevlic calcifications on the right are
phleboliths based on abdominal CT 09/28/2007. Bowel gas pattern is
nonobstructive. No concerning intra-abdominal mass effect. Focal
L2-3 degenerative disc disease.
IMPRESSION: No evidence of urolithiasis.

## 2016-08-05 ENCOUNTER — Telehealth: Payer: Self-pay

## 2016-08-05 NOTE — Telephone Encounter (Signed)
send a mychart message, needs a TSH . Please arrange  Received: [place], MD  Damita Dunnings, [place] message sent to Pt informing she is due for TSH. Instructed Pt to call and schedule lab appt at her convenience. TSH ordered.

## 2016-08-15 ENCOUNTER — Other Ambulatory Visit: Payer: Self-pay | Admitting: Internal Medicine

## 2016-08-17 ENCOUNTER — Other Ambulatory Visit (INDEPENDENT_AMBULATORY_CARE_PROVIDER_SITE_OTHER): Payer: BLUE CROSS/BLUE SHIELD

## 2016-08-17 DIAGNOSIS — Z0000 Encounter for general adult medical examination without abnormal findings: Secondary | ICD-10-CM

## 2016-08-17 LAB — FECAL OCCULT BLOOD, IMMUNOCHEMICAL: Fecal Occult Bld: NEGATIVE

## 2016-09-06 ENCOUNTER — Encounter: Payer: Self-pay | Admitting: Internal Medicine

## 2016-09-06 ENCOUNTER — Telehealth: Payer: Self-pay | Admitting: Internal Medicine

## 2016-09-06 DIAGNOSIS — B029 Zoster without complications: Secondary | ICD-10-CM

## 2016-09-06 MED ORDER — HYDROCODONE-ACETAMINOPHEN 5-325 MG PO TABS: 1.0000 | ORAL_TABLET | Freq: Four times a day (QID) | ORAL | 0 refills | Status: DC | PRN

## 2016-09-06 NOTE — Telephone Encounter (Signed)
Okay #90, one refill 

## 2016-09-06 NOTE — Telephone Encounter (Signed)
Please inform Pt that Rx has been placed at front desk for pick up at her convenience. Thank you.  

## 2016-09-06 NOTE — Telephone Encounter (Signed)
Relation to [id number] Call back [phone]   Reason for call:  Patient requesting a refill HYDROcodone-acetaminophen (NORCO) 5-325 MG tablet

## 2016-09-06 NOTE — Telephone Encounter (Signed)
Pt is requesting refill on Hydrocodone.  Last OV: 05/06/2016 Last Fill: 05/06/2016 #90 and 0RF For May and June 2017 UDS: 08/25/2015 Low risk  Please advise.

## 2016-09-06 NOTE — Addendum Note (Signed)
Addended byDamita Dunnings D on: 09/06/2016 01:28 PM   Modules accepted: Orders

## 2016-09-06 NOTE — Telephone Encounter (Signed)
Rx's for September and October 2017 printed, awaiting MD signature.

## 2016-09-07 NOTE — Telephone Encounter (Signed)
Patient informed. 

## 2016-09-08 ENCOUNTER — Other Ambulatory Visit: Payer: BLUE CROSS/BLUE SHIELD

## 2016-09-13 ENCOUNTER — Other Ambulatory Visit (INDEPENDENT_AMBULATORY_CARE_PROVIDER_SITE_OTHER): Payer: BLUE CROSS/BLUE SHIELD

## 2016-09-13 DIAGNOSIS — E039 Hypothyroidism, unspecified: Secondary | ICD-10-CM | POA: Diagnosis not present

## 2016-09-13 LAB — TSH: TSH: 0.21 u[IU]/mL — ABNORMAL LOW (ref 0.35–4.50)

## 2016-09-16 ENCOUNTER — Telehealth: Payer: Self-pay

## 2016-09-16 MED ORDER — LEVOTHYROXINE SODIUM 100 MCG PO TABS: 100.0000 ug | ORAL_TABLET | Freq: Every day | ORAL | 2 refills | Status: DC

## 2016-09-16 NOTE — Telephone Encounter (Signed)
UDS:09/06/2016  Negative for Clonazepam: Rx'ed frequently Positive for Hydrocodone   Moderate risk per Dr. Larose Kells 09/16/2016

## 2016-11-15 ENCOUNTER — Telehealth: Payer: Self-pay | Admitting: Internal Medicine

## 2016-11-15 DIAGNOSIS — B028 Zoster with other complications: Secondary | ICD-10-CM

## 2016-11-15 DIAGNOSIS — B029 Zoster without complications: Secondary | ICD-10-CM

## 2016-11-15 MED ORDER — HYDROCODONE-ACETAMINOPHEN 5-325 MG PO TABS: 1.0000 | ORAL_TABLET | Freq: Four times a day (QID) | ORAL | 0 refills | Status: DC | PRN

## 2016-11-15 NOTE — Telephone Encounter (Signed)
Pt is requesting refill on Hydrocodone.  Last OV: 05/06/2016 Last Fill: 09/06/2016 #90 and 0RF (For September and October 2017) UDS: 09/06/2016 Moderate risk (+ for Norco)   Please advise.

## 2016-11-15 NOTE — Telephone Encounter (Signed)
Okay, 2 prescriptions. Will need UDS in a couple of months.

## 2016-11-15 NOTE — Telephone Encounter (Signed)
Rx placed at front desk for pick up. Pt informed via MyChart.

## 2016-11-15 NOTE — Telephone Encounter (Signed)
Rx's for November and December 2017 printed, awaiting MD signature.  

## 2016-11-22 ENCOUNTER — Ambulatory Visit (INDEPENDENT_AMBULATORY_CARE_PROVIDER_SITE_OTHER): Payer: BLUE CROSS/BLUE SHIELD | Admitting: Physician Assistant

## 2016-11-22 VITALS — BP 148/96 | HR 101 | Temp 99.0°F | Resp 16 | Ht 62.25 in | Wt 145.6 lb

## 2016-11-22 DIAGNOSIS — R059 Cough, unspecified: Secondary | ICD-10-CM

## 2016-11-22 DIAGNOSIS — B349 Viral infection, unspecified: Secondary | ICD-10-CM | POA: Diagnosis not present

## 2016-11-22 DIAGNOSIS — R000 Tachycardia, unspecified: Secondary | ICD-10-CM | POA: Diagnosis not present

## 2016-11-22 DIAGNOSIS — R0602 Shortness of breath: Secondary | ICD-10-CM | POA: Diagnosis not present

## 2016-11-22 DIAGNOSIS — R05 Cough: Secondary | ICD-10-CM

## 2016-11-22 LAB — POCT CBC
Granulocyte percent: 64.3 %G (ref 37–80)
HCT, POC: 41.4 % (ref 37.7–47.9)
Hemoglobin: 14.8 g/dL (ref 12.2–16.2)
Lymph, poc: 1.5 (ref 0.6–3.4)
MCH, POC: 32.4 pg — AB (ref 27–31.2)
MCHC: 35.6 g/dL — AB (ref 31.8–35.4)
MCV: 91 fL (ref 80–97)
MID (cbc): 0.6 (ref 0–0.9)
MPV: 7.8 fL (ref 0–99.8)
POC Granulocyte: 3.7 (ref 2–6.9)
POC LYMPH PERCENT: 25 %L (ref 10–50)
POC MID %: 10.7 %M (ref 0–12)
Platelet Count, POC: 217 10*3/uL (ref 142–424)
RBC: 4.55 M/uL (ref 4.04–5.48)
RDW, POC: 13 %
WBC: 5.8 10*3/uL (ref 4.6–10.2)

## 2016-11-22 MED ORDER — ALBUTEROL SULFATE HFA 108 (90 BASE) MCG/ACT IN AERS
2.0000 | INHALATION_SPRAY | RESPIRATORY_TRACT | 1 refills | Status: DC | PRN
Start: 2016-11-22 — End: 2017-03-27

## 2016-11-22 MED ORDER — PREDNISONE 20 MG PO TABS: ORAL_TABLET | ORAL | 0 refills | Status: DC

## 2016-11-22 MED ORDER — HYDROCODONE-HOMATROPINE 5-1.5 MG/5ML PO SYRP: 5.0000 mL | ORAL_SOLUTION | Freq: Three times a day (TID) | ORAL | 0 refills | Status: DC | PRN

## 2016-11-22 NOTE — Patient Instructions (Addendum)
Please drink plenty of water, this is important to help your body clear this illness. Your blood pressure and pulse are elevated more than likely due to dehydration. Try to drink at least 3 liters or water/day. Hot tea with honey or clear soup is also a great way to get liquids in.  If you are not feeling better in one week, please come back.   Thank you for coming in today. I hope you feel we met your needs.  Feel free to call [facility] if you have any questions or further requests.  Please consider signing up for MyChart if you do not already have it, as this is a great way to communicate with me.  Best,  Whitney McVey, PA-C   IF you received an x-ray today, you will receive an invoice from [facility]. Please contact [facility] at [phone] with questions or concerns regarding your invoice.   IF you received labwork today, you will receive an invoice from [employer]. Please contact [facility] at [phone] with questions or concerns regarding your invoice.   Our billing staff will not be able to assist you with questions regarding bills from these companies.  You will be contacted with the lab results as soon as they are available. The fastest way to get your results is to activate your My Chart account. Instructions are located on the last page of this paperwork. If you have not heard from [place] regarding the results in 2 weeks, please contact this office.

## 2016-11-22 NOTE — Progress Notes (Signed)
Sandra Owen  MRN: [id number] DOB: Nov 27, 1951  PCP: Kathlene November, MD  Subjective:  Pt is a 65 year old female who presents to clinic for illness. Started with a sore throat five days ago. Extremely red sore throat, salt water gargles didn't help. Has progressed to dry cough, sinus pressure and runny nose. Her temperature has been around 100. Also complains of shortness of breath constantly, she cannot take a full breath of air without coughing. She is sleeping sitting up due to congestion and runny nose. Cough is keeping her up at night. This shortness of breath is concerning to her.  Has tried Mucinex, Tylenol - has not helped. Denies chest pain, light-headedness, nausea, vomiting, diarrhea, abdominal pain.   Review of Systems  Constitutional: Positive for chills, fatigue and fever. Negative for diaphoresis.  HENT: Positive for congestion, postnasal drip, rhinorrhea, sinus pressure and sore throat. Negative for sneezing.   Respiratory: Positive for cough and shortness of breath. Negative for chest tightness and wheezing.   Cardiovascular: Negative for chest pain and palpitations.  Gastrointestinal: Negative for abdominal pain, diarrhea, nausea and vomiting.  Neurological: Positive for headaches. Negative for weakness and light-headedness.  Psychiatric/Behavioral: Positive for sleep disturbance.    Patient Active Problem List   Diagnosis Date Noted  . Neck pain 07/14/2014  . DJD -pain managment 03/16/2011  . Hypothyroidism 08/10/2007  . COMMON MIGRAINE 08/10/2007  . Essential hypertension 08/10/2007  . ALLERGIC RHINITIS 08/10/2007    Current Outpatient Prescriptions on File Prior to Visit  Medication Sig Dispense Refill  . clonazePAM (KLONOPIN) 0.5 MG tablet Take 1 tablet (0.5 mg total) by mouth 2 (two) times daily as needed for anxiety. 60 tablet 2  . hydrochlorothiazide (HYDRODIURIL) 25 MG tablet Take 1 tablet (25 mg total) by mouth daily. 90 tablet 2  . HYDROcodone-acetaminophen  (NORCO) 5-325 MG tablet Take 1 tablet by mouth every 6 (six) hours as needed for moderate pain. 90 tablet 0  . HYDROcodone-acetaminophen (NORCO) 5-325 MG tablet Take 1 tablet by mouth every 6 (six) hours as needed for moderate pain. 90 tablet 0  . levothyroxine (SYNTHROID, LEVOTHROID) 100 MCG tablet Take 1 tablet (100 mcg total) by mouth daily. 30 tablet 2   No current facility-administered medications on file prior to visit.     Allergies  Allergen Reactions  . Codeine Nausea Only  . Tape Other (See Comments)    Dermatitis     Objective:  BP (!) 148/96 (BP Location: Left Arm, Patient Position: Sitting, Cuff Size: Normal)   Pulse (!) 101   Temp 99 F (37.2 C) (Oral)   Resp 16   Ht 5' 2.25" (1.581 m)   Wt 145 lb 9.6 oz (66 kg)   SpO2 98%   BMI 26.42 kg/m   Physical Exam  Constitutional: She is oriented to person, place, and time and well-developed, well-nourished, and in no distress. No distress.  HENT:  Left Ear: Tympanic membrane normal.  Mouth/Throat: Mucous membranes are normal. Posterior oropharyngeal edema present. No oropharyngeal exudate or posterior oropharyngeal erythema.  Cardiovascular: Normal rate, regular rhythm and normal heart sounds.   Pulmonary/Chest: Effort normal and breath sounds normal. She has no wheezes. She has no rhonchi. She has no rales.  Lymphadenopathy:    She has no cervical adenopathy.  Neurological: She is alert and oriented to person, place, and time. GCS score is 15.  Skin: Skin is warm and dry.  Psychiatric: Mood, memory, affect and judgment normal.  Vitals reviewed.  Results for  orders placed or performed in visit on 11/22/16  POCT CBC  Result Value Ref Range   WBC 5.8 4.6 - 10.2 K/uL   Lymph, poc 1.5 0.6 - 3.4   POC LYMPH PERCENT 25.0 10 - 50 %L   MID (cbc) 0.6 0 - 0.9   POC MID % 10.7 0 - 12 %M   POC Granulocyte 3.7 2 - 6.9   Granulocyte percent 64.3 37 - 80 %G   RBC 4.55 4.04 - 5.48 M/uL   Hemoglobin 14.8 12.2 - 16.2 g/dL    HCT, POC 41.4 37.7 - 47.9 %   MCV 91.0 80 - 97 fL   MCH, POC 32.4 (A) 27 - 31.2 pg   MCHC 35.6 (A) 31.8 - 35.4 g/dL   RDW, POC 13.0 %   Platelet Count, POC 217 142 - 424 K/uL   MPV 7.8 0 - 99.8 fL    Assessment and Plan :  1. Viral illness 2. Tachycardia 3. Cough 4. Shortness of breath - POCT CBC - HYDROcodone-homatropine (HYCODAN) 5-1.5 MG/5ML syrup; Take 5 mLs by mouth every 8 (eight) hours as needed for cough.  Dispense: 120 mL; Refill: 0 - predniSONE (DELTASONE) 20 MG tablet; Take 3 PO QAM x3days, 2 PO QAM x3days, 1 PO QAM x3days  Dispense: 18 tablet; Refill: 0 - albuterol (PROVENTIL HFA;VENTOLIN HFA) 108 (90 Base) MCG/ACT inhaler; Inhale 2 puffs into the lungs every 4 (four) hours as needed for wheezing or shortness of breath (cough, shortness of breath or wheezing.).  Dispense: 1 Inhaler; Refill: 1 - Supportive care encouraged. Please drink plenty of fluids and rest. Use Albuterol if steroid taper did not provide relief she needed. RTC in 5-7 days if no improvement.    Mercer Pod, PA-C  [facility] 11/22/2016 8:29 AM

## 2016-12-11 ENCOUNTER — Other Ambulatory Visit: Payer: Self-pay | Admitting: Internal Medicine

## 2016-12-29 ENCOUNTER — Other Ambulatory Visit: Payer: Self-pay | Admitting: Internal Medicine

## 2017-01-03 ENCOUNTER — Encounter: Payer: Self-pay | Admitting: Internal Medicine

## 2017-01-12 ENCOUNTER — Other Ambulatory Visit: Payer: Self-pay | Admitting: Internal Medicine

## 2017-01-16 NOTE — Telephone Encounter (Signed)
TSH is future ordered.

## 2017-01-16 NOTE — Telephone Encounter (Signed)
Patient has lab appointment scheduled for 01/19/17, there are no current orders for her labs in the chart yet. There is a TSH from 09/16/16. Please advise

## 2017-01-19 ENCOUNTER — Other Ambulatory Visit (INDEPENDENT_AMBULATORY_CARE_PROVIDER_SITE_OTHER): Payer: BLUE CROSS/BLUE SHIELD

## 2017-01-19 DIAGNOSIS — E039 Hypothyroidism, unspecified: Secondary | ICD-10-CM

## 2017-01-19 LAB — TSH: TSH: 2.91 u[IU]/mL (ref 0.35–4.50)

## 2017-01-20 MED ORDER — LEVOTHYROXINE SODIUM 100 MCG PO TABS: 100.0000 ug | ORAL_TABLET | Freq: Every day | ORAL | 4 refills | Status: DC

## 2017-01-21 ENCOUNTER — Other Ambulatory Visit: Payer: Self-pay | Admitting: Internal Medicine

## 2017-01-21 DIAGNOSIS — B029 Zoster without complications: Secondary | ICD-10-CM

## 2017-01-23 ENCOUNTER — Other Ambulatory Visit: Payer: BLUE CROSS/BLUE SHIELD

## 2017-01-23 ENCOUNTER — Telehealth: Payer: Self-pay | Admitting: Internal Medicine

## 2017-01-23 MED ORDER — HYDROCHLOROTHIAZIDE 25 MG PO TABS: 25.0000 mg | ORAL_TABLET | Freq: Every day | ORAL | 1 refills | Status: DC

## 2017-01-23 MED ORDER — LEVOTHYROXINE SODIUM 100 MCG PO TABS: 100.0000 ug | ORAL_TABLET | Freq: Every day | ORAL | 1 refills | Status: DC

## 2017-01-23 MED ORDER — HYDROCODONE-ACETAMINOPHEN 5-325 MG PO TABS: 1.0000 | ORAL_TABLET | Freq: Four times a day (QID) | ORAL | 0 refills | Status: DC | PRN

## 2017-01-23 NOTE — Telephone Encounter (Signed)
Pt informed via MyChart that Rx has been placed at front desk for pick up at her convenience.  

## 2017-01-23 NOTE — Telephone Encounter (Signed)
Caller name: Relationship to patient: Self Can be reached: [phone]  Pharmacy:  Reason for call:Patient states insurance has changed and she now needs a 90 day supply on medications

## 2017-01-23 NOTE — Telephone Encounter (Signed)
Okay #90, no refills. Check a UDS

## 2017-01-23 NOTE — Telephone Encounter (Signed)
HCTZ and Synthroid sent to [facility] pharmacy.

## 2017-01-23 NOTE — Telephone Encounter (Signed)
Pt is requesting refill on Hydrocodone.  Last OV: 05/06/2016 Last Fill: 11/15/2016 #90 and 0RF (For November and December 2017) UDS: 09/06/2016 Moderate risk  Please advise.

## 2017-01-24 ENCOUNTER — Encounter: Payer: Self-pay | Admitting: Internal Medicine

## 2017-02-20 ENCOUNTER — Telehealth: Payer: Self-pay | Admitting: Internal Medicine

## 2017-02-20 MED ORDER — HYDROCHLOROTHIAZIDE 25 MG PO TABS: 25.0000 mg | ORAL_TABLET | Freq: Every day | ORAL | 0 refills | Status: DC

## 2017-02-20 MED ORDER — LEVOTHYROXINE SODIUM 100 MCG PO TABS: 100.0000 ug | ORAL_TABLET | Freq: Every day | ORAL | 0 refills | Status: DC

## 2017-02-20 NOTE — Telephone Encounter (Signed)
Rx's sent. Pt overdue for 6 month follow-up. Please schedule at her convenience. Thank you.

## 2017-02-20 NOTE — Telephone Encounter (Signed)
Patient informed of message below and will call back to schedule appointment.

## 2017-02-20 NOTE — Telephone Encounter (Signed)
°  Relation to [id number] Call back [phone] Pharmacy:[facility]  [phone] phone   Reason for call: Patient requesting 90 day supply levothyroxine (SYNTHROID, LEVOTHROID) 100 MCG tablet and hydrochlorothiazide (HYDRODIURIL) 25 MG tablet please send to [facility]

## 2017-02-24 ENCOUNTER — Telehealth: Payer: Self-pay

## 2017-02-24 NOTE — Telephone Encounter (Signed)
UDS: 01/24/2017  Positive for Hydrocodone   Low risk per PCP 02/24/2017

## 2017-03-27 ENCOUNTER — Ambulatory Visit (INDEPENDENT_AMBULATORY_CARE_PROVIDER_SITE_OTHER): Payer: BLUE CROSS/BLUE SHIELD | Admitting: Internal Medicine

## 2017-03-27 ENCOUNTER — Encounter: Payer: Self-pay | Admitting: Internal Medicine

## 2017-03-27 VITALS — BP 162/90 | HR 86 | Temp 98.2°F | Resp 14 | Ht 62.25 in | Wt 148.1 lb

## 2017-03-27 DIAGNOSIS — R5383 Other fatigue: Secondary | ICD-10-CM | POA: Diagnosis not present

## 2017-03-27 DIAGNOSIS — R002 Palpitations: Secondary | ICD-10-CM

## 2017-03-27 DIAGNOSIS — I10 Essential (primary) hypertension: Secondary | ICD-10-CM | POA: Diagnosis not present

## 2017-03-27 DIAGNOSIS — E039 Hypothyroidism, unspecified: Secondary | ICD-10-CM

## 2017-03-27 NOTE — Progress Notes (Signed)
Subjective:    Patient ID: Sandra Owen, female    DOB: [birth year], 66 y.o.   MRN: [id number]  DOS:  03/27/2017 Type of visit - description : acute Interval history: Concern about palpitations, this is going on for about 6 months, described as her heart racing, episodes last about 10 minutes, they have been every few days. Sometimes associated with nausea but no vomiting, no chest pain- difficulty breathing. Feels weak but no loss of consciousness or syncope.  Also, having dizziness on and off, symptoms are random and not associated with palpitations. The last few minutes, described as a spinning. No  diplopia, motor deficits, face numbness or paralysis.  Review of Systems Stress is at baseline, does not feel more anxious than on regular basis. Ambulatory BPs usually 130, she has noted her pulse to be consistently ~100 when she checks her BP.Marland Kitchen She reports + fatigues, feeling very sleepy. Denies snoring  Past Medical History:  Diagnosis Date  . Allergic rhinitis   . Anxiety and depression    not currently  . Hypertension   . Hypothyroidism   . PONV (postoperative nausea and vomiting)     Past Surgical History:  Procedure Laterality Date  . ANTERIOR AND POSTERIOR REPAIR N/A 06/25/2013   Procedure: ANTERIOR (CYSTOCELE) AND POSTERIOR REPAIR (RECTOCELE);  Surgeon: Gus Height, MD;  Location: [facility];  Service: Gynecology;  Laterality: N/A;  . [place]  . TONSILLECTOMY    . TUBAL LIGATION    . VAGINAL HYSTERECTOMY N/A 06/25/2013   Procedure: HYSTERECTOMY VAGINAL;  Surgeon: Gus Height, MD;  Location: [facility];  Service: Gynecology;  Laterality: N/A;    Social History   Social History  . Marital status: Married    Spouse name: N/A  . Number of children: 1  . Years of education: N/A   Occupational History  . sales / customer service    Social History Main Topics  . Smoking status: Never Smoker  . Smokeless tobacco: Never Used  . Alcohol use Yes     Comment: socially    . Drug use: No  . Sexual activity: Not on file   Other Topics Concern  . Not on file   Social History Narrative   Lives w/ husband      Allergies as of 03/27/2017      Reactions   Codeine Nausea Only   Tape Other (See Comments)   Dermatitis      Medication List       Accurate as of 03/27/17 11:59 PM. Always use your most recent med list.          clonazePAM 0.5 MG tablet Commonly known as:  KLONOPIN Take 1 tablet (0.5 mg total) by mouth 2 (two) times daily as needed for anxiety.   hydrochlorothiazide 25 MG tablet Commonly known as:  HYDRODIURIL Take 1 tablet (25 mg total) by mouth daily.   HYDROcodone-acetaminophen 5-325 MG tablet Commonly known as:  NORCO Take 1 tablet by mouth every 6 (six) hours as needed for moderate pain.   levothyroxine 100 MCG tablet Commonly known as:  SYNTHROID, LEVOTHROID Take 1 tablet (100 mcg total) by mouth daily before breakfast.   OVER THE COUNTER MEDICATION 2 (two) times daily.   OVER THE COUNTER MEDICATION 3 times/day as needed-between meals & bedtime.          Objective:   Physical Exam BP (!) 162/90 (BP Location: Left Arm, Patient Position: Sitting, Cuff Size: Small)   Pulse 86  Temp 98.2 F (36.8 C) (Oral)   Resp 14   Ht 5' 2.25" (1.581 m)   Wt 148 lb 2 oz (67.2 kg)   SpO2 98%   BMI 26.88 kg/m  General:   Well developed, well nourished . NAD.  HEENT:  Normocephalic . Face symmetric, atraumatic Neck: No thyromegaly, normal carotid pulses Lungs:  CTA B Normal respiratory effort, no intercostal retractions, no accessory muscle use. Heart: RRR,  no murmur.  no pretibial edema bilaterally  Abdomen:  Not distended, soft, non-tender. No rebound or rigidity.  Palpable, nontender aorta. No bruit Skin: Not pale. Not jaundice Neurologic:  alert & oriented X3.  Speech normal, gait appropriate for age and unassisted ; motor and face symmetric. Psych--  Cognition and judgment appear intact.  Cooperative with  normal attention span and concentration.  Behavior appropriate. No anxious or depressed appearing.    Assessment & Plan:  Assessment > HTN Hypothyroidism DJD--shoulder, neck,  on hydrocodone H/o Anxiety depression Shingles 12-2015   PLAN Palpitations: As described above, EKG today sinus rhythm, rate 75. Recommend a TSH, CMP, CBC and cardiology referral HTN: BP today slightly elevated, recommend ambulatory BPs and call if BPs are consistently elevated. Hypothyroidism: Good medication compliance, states take Synthroid every morning on empty stomach TSH varies widely, unclear why, checking labs today, consider branded Synthroid versus Endo referral  ER if  severe symptoms Fatigue: feeling very sleepy, epworht scale 12 (positive); will get a b12-folic acid, vit D ;  OSA ? Stay she does not snore all, reluctant to be refer for further eval , will reassess on RTC. RTC 3 months, CPX

## 2017-03-27 NOTE — Progress Notes (Signed)
Pre visit review using our clinic review tool, if applicable. No additional management support is needed unless otherwise documented below in the visit note. 

## 2017-03-27 NOTE — Patient Instructions (Signed)
GO TO THE LAB : Get the blood work     GO TO THE FRONT DESK Schedule your next appointment for a  physical exam in 3 months   Check the  blood pressure 2 or 3 times a  Week  Be sure your blood pressure is between 110/65 and  145/85.  if it is consistently higher or lower, let me know

## 2017-03-28 LAB — COMPREHENSIVE METABOLIC PANEL
ALT: 14 U/L (ref 0–35)
AST: 17 U/L (ref 0–37)
Albumin: 4.5 g/dL (ref 3.5–5.2)
Alkaline Phosphatase: 55 U/L (ref 39–117)
BUN: 23 mg/dL (ref 6–23)
CO2: 30 mEq/L (ref 19–32)
Calcium: 10.1 mg/dL (ref 8.4–10.5)
Chloride: 101 mEq/L (ref 96–112)
Creatinine, Ser: 0.82 mg/dL (ref 0.40–1.20)
GFR: 74.48 mL/min (ref >60.00)
Glucose, Bld: 89 mg/dL (ref 70–99)
Potassium: 3.8 mEq/L (ref 3.5–5.1)
Sodium: 140 mEq/L (ref 135–145)
Total Bilirubin: 0.4 mg/dL (ref 0.2–1.2)
Total Protein: 7 g/dL (ref 6.0–8.3)

## 2017-03-28 LAB — CBC WITH DIFFERENTIAL/PLATELET
Basophils Absolute: 0.1 10*3/uL (ref 0.0–0.1)
Basophils Relative: 1.1 % (ref 0.0–3.0)
Eosinophils Absolute: 0.1 10*3/uL (ref 0.0–0.7)
Eosinophils Relative: 2.7 % (ref 0.0–5.0)
HCT: 42.2 % (ref 36.0–46.0)
Hemoglobin: 14.7 g/dL (ref 12.0–15.0)
Lymphocytes Relative: 38.3 % (ref 12.0–46.0)
Lymphs Abs: 2 10*3/uL (ref 0.7–4.0)
MCHC: 34.9 g/dL (ref 30.0–36.0)
MCV: 92.4 fl (ref 78.0–100.0)
Monocytes Absolute: 0.4 10*3/uL (ref 0.1–1.0)
Monocytes Relative: 7.7 % (ref 3.0–12.0)
Neutro Abs: 2.6 10*3/uL (ref 1.4–7.7)
Neutrophils Relative %: 50.2 % (ref 43.0–77.0)
Platelets: 247 10*3/uL (ref 150.0–400.0)
RBC: 4.57 Mil/uL (ref 3.87–5.11)
RDW: 12.6 % (ref 11.5–15.5)
WBC: 5.2 10*3/uL (ref 4.0–10.5)

## 2017-03-28 LAB — FOLATE: Folate: 12.1 ng/mL (ref >5.9)

## 2017-03-28 LAB — TSH: TSH: 0.15 u[IU]/mL — ABNORMAL LOW (ref 0.35–4.50)

## 2017-03-28 LAB — VITAMIN B12: Vitamin B-12: 354 pg/mL (ref 211–911)

## 2017-03-28 NOTE — Assessment & Plan Note (Signed)
Palpitations: As described above, EKG today sinus rhythm, rate 75. Recommend a TSH, CMP, CBC and cardiology referral HTN: BP today slightly elevated, recommend ambulatory BPs and call if BPs are consistently elevated. Hypothyroidism: Good medication compliance, states take Synthroid every morning on empty stomach TSH varies widely, unclear why, checking labs today, consider branded Synthroid versus Endo referral  ER if  severe symptoms Fatigue: feeling very sleepy, epworht scale 12 (positive); will get a b12-folic acid, vit D ;  OSA ? Stay she does not snore all, reluctant to be refer for further eval , will reassess on RTC. RTC 3 months, CPX

## 2017-03-30 ENCOUNTER — Encounter: Payer: Self-pay | Admitting: Internal Medicine

## 2017-03-31 LAB — VITAMIN D 1,25 DIHYDROXY
Vitamin D 1, 25 (OH)2 Total: 41 pg/mL (ref 18–72)
Vitamin D2 1, 25 (OH)2: <8 pg/mL
Vitamin D3 1, 25 (OH)2: 41 pg/mL

## 2017-03-31 MED ORDER — SYNTHROID 88 MCG PO TABS: 88.0000 ug | ORAL_TABLET | Freq: Every day | ORAL | 1 refills | Status: DC

## 2017-03-31 NOTE — Addendum Note (Signed)
Addended byDamita Dunnings D on: 03/31/2017 05:07 PM   Modules accepted: Orders

## 2017-04-10 ENCOUNTER — Ambulatory Visit (INDEPENDENT_AMBULATORY_CARE_PROVIDER_SITE_OTHER): Payer: BLUE CROSS/BLUE SHIELD | Admitting: Cardiology

## 2017-04-10 ENCOUNTER — Encounter: Payer: Self-pay | Admitting: Cardiology

## 2017-04-10 VITALS — BP 148/86 | HR 66 | Ht 63.0 in | Wt 151.4 lb

## 2017-04-10 DIAGNOSIS — R002 Palpitations: Secondary | ICD-10-CM | POA: Diagnosis not present

## 2017-04-10 NOTE — Patient Instructions (Addendum)
Medication Instructions:    Your physician recommends that you continue on your current medications as directed. Please refer to the Current Medication list given to you today.  Labwork:  None ordered  Testing/Procedures: Your physician has recommended that you wear an event monitor. Event monitors are medical devices that record the heart's electrical activity. Doctors most often [place] these monitors to diagnose arrhythmias. Arrhythmias are problems with the speed or rhythm of the heartbeat. The monitor is a small, portable device. You can wear one while you do your normal daily activities. This is usually used to diagnose what is causing palpitations/syncope (passing out).   Follow-Up:  Your physician recommends that you schedule a follow-up appointment in: 6 weeks with Dr. Curt Bears (after the monitor is completed).     Any Other Special Instructions Will Be Listed Below (If Applicable).  Cardiac Event Monitoring A cardiac event monitor is a small recording device that is used to detect abnormal heart rhythms (arrhythmias). The monitor is used to record your heart rhythm when you have symptoms, such as:  Fast heartbeats (palpitations), such as heart racing or fluttering.  Dizziness.  Fainting or light-headedness.  Unexplained weakness. Some monitors are wired to electrodes placed on your chest. Electrodes are flat, sticky disks that attach to your skin. Other monitors may be hand-held or worn on the wrist. The monitor can be worn for up to 30 days. If the monitor is attached to your chest, a technician will prepare your chest for the electrode placement and show you how to work the monitor. Take time to practice using the monitor before you leave the office. Make sure you understand how to send the information from the monitor to your health care provider. In some cases, you may need to use a landline telephone instead of a cell phone. What are the risks? Generally, this device is  safe to use, but it possible that the skin under the electrodes will become irritated. How to use your cardiac event monitor  Wear your monitor at all times, except when you are in water:  Do not let the monitor get wet.  Take the monitor off when you bathe. Do not swim or use a hot tub with it on.  Keep your skin clean. Do not put body lotion or moisturizer on your chest.  Change the electrodes as told by your health care provider or any time they stop sticking to your skin. You may need to use medical tape to keep them on.  Try to put the electrodes in slightly different places on your chest to help prevent skin irritation. They must remain in the area under your left breast and in the upper right section of your chest.  Make sure the monitor is safely clipped to your clothing or in a location close to your body that your health care provider recommends.  Press the button to record as soon as you feel heart-related symptoms, such as:  Dizziness.  Weakness.  Light-headedness.  Palpitations.  Thumping or pounding in your chest.  Shortness of breath.  Unexplained weakness.  Keep a diary of your activities, such as walking, doing chores, and taking medicine. It is very important to note what you were doing when you pushed the button to record your symptoms. This will help your health care provider determine what might be contributing to your symptoms.  Send the recorded information as recommended by your health care provider. It may take some time for your health care provider to process  the results.  Change the batteries as told by your health care provider.  Keep electronic devices away from your monitor. This includes:  Tablets.  MP3 players.  Cell phones.  While wearing your monitor you should avoid:  Electric blankets.  Armed forces operational officer.  Electric toothbrushes.  Microwave ovens.  Magnets.  Metal detectors. Get help right away if:  You have chest  pain.  You have extreme difficulty breathing or shortness of breath.  You develop a very fast heartbeat that persists.  You develop dizziness that does not go away.  You faint or constantly feel like you are about to faint. Summary  A cardiac event monitor is a small recording device that is used to help detect abnormal heart rhythms (arrhythmias).  The monitor is used to record your heart rhythm when you have heart-related symptoms.  Make sure you understand how to send the information from the monitor to your health care provider.  It is important to press the button on the monitor when you have any heart-related symptoms.  Keep a diary of your activities, such as walking, doing chores, and taking medicine. It is very important to note what you were doing when you pushed the button to record your symptoms. This will help your health care provider learn what might be causing your symptoms. This information is not intended to replace advice given to you by your health care provider. Make sure you discuss any questions you have with your health care provider. Document Released: 09/13/2008 Document Revised: 11/19/2016 Document Reviewed: 11/19/2016 Elsevier Interactive Patient Education  2017 [employer].      - If you need a refill on your cardiac medications before your next appointment, please call your pharmacy.   Thank you for choosing [facility]!!   Trinidad Curet, RN [phone]  Any Other Special Instructions Will Be Listed Below (If Applicable).

## 2017-04-10 NOTE — Progress Notes (Signed)
Electrophysiology Office Note   Date:  04/10/2017   ID:  Sandra Owen, DOB 1951/07/13, MRN [id number]  PCP:  Sandra November, MD  Primary Electrophysiologist:  Sandra Lauman Meredith Leeds, MD    Chief Complaint  Patient presents with  . New Patient (Initial Visit)    Palpitations     History of Present Illness: Sandra Owen is a 66 y.o. female who is being seen today for the evaluation of palpitations at the request of Colon Branch, MD. Presenting today for electrophysiology evaluation. Review of prior outside records. She's been having palpitations for 6 months. She describes them as her heart racing lasting about 10 minutes they happen every few days. Occasionally associated with nausea but no vomiting. No chest pain or difficulty breathing. She feels weak but has not lost consciousness and not had syncope. She had been having dizziness on and off. Symptoms are random and not associated with palpitations. They last a few minutes and are described as room spinning. Her palpitations do not have any exacerbating or alleviating factors. She says that they occur sometimes when she is asleep and wake her at night. She brings in heart rate recordings that have rates in the 70s to low 100s. Her blood pressure monitor has told her multiple times that her heart is irregular.    Today, she denies symptoms of palpitations, chest pain, shortness of breath, orthopnea, PND, lower extremity edema, claudication, dizziness, presyncope, syncope, bleeding, or neurologic sequela. The patient is tolerating medications without difficulties.    Past Medical History:  Diagnosis Date  . Allergic rhinitis   . Anxiety and depression    not currently  . Hypertension   . Hypothyroidism   . PONV (postoperative nausea and vomiting)    Past Surgical History:  Procedure Laterality Date  . ANTERIOR AND POSTERIOR REPAIR N/A 06/25/2013   Procedure: ANTERIOR (CYSTOCELE) AND POSTERIOR REPAIR (RECTOCELE);  Surgeon: Sandra Height,  MD;  Location: [facility];  Service: Gynecology;  Laterality: N/A;  . [place]  . TONSILLECTOMY    . TUBAL LIGATION    . VAGINAL HYSTERECTOMY N/A 06/25/2013   Procedure: HYSTERECTOMY VAGINAL;  Surgeon: Sandra Height, MD;  Location: [facility];  Service: Gynecology;  Laterality: N/A;     Current Outpatient Prescriptions  Medication Sig Dispense Refill  . hydrochlorothiazide (HYDRODIURIL) 25 MG tablet Take 1 tablet (25 mg total) by mouth daily. 90 tablet 0  . OVER THE COUNTER MEDICATION 2 (two) times daily.    Marland Kitchen SYNTHROID 88 MCG tablet Take 1 tablet (88 mcg total) by mouth daily before breakfast. 30 tablet 1   No current facility-administered medications for this visit.     Allergies:   Codeine and Tape   Social History:  The patient  reports that she has never smoked. She has never used smokeless tobacco. She reports that she drinks alcohol. She reports that she does not use drugs.   Family History:  The patient's family history includes Cancer in her father; Heart disease in her brother; Hyperlipidemia in her mother; Hypertension in her mother; Pancreatic cancer in her father.    ROS:  Please see the history of present illness.   Otherwise, review of systems is positive for Fatigue, chest pain, palpitations, leg pain, dizziness, headaches.   All other systems are reviewed and negative.    PHYSICAL EXAM: VS:  BP (!) 148/86   Pulse 66   Ht 5\' 3"  (1.6 m)   Wt 151 lb 6.4 oz (68.7 kg)  BMI 26.82 kg/m  , BMI Body mass index is 26.82 kg/m. GEN: Well nourished, well developed, in no acute distress  HEENT: normal  Neck: no JVD, carotid bruits, or masses Cardiac: RRR; no murmurs, rubs, or gallops,no edema  Respiratory:  clear to auscultation bilaterally, normal work of breathing GI: soft, nontender, nondistended, + BS MS: no deformity or atrophy  Skin: warm and dry Neuro:  Strength and sensation are intact Psych: euthymic mood, full affect  EKG:  EKG is not ordered today. Personal  review of the ekg ordered 03/27/17 shows sinus rhythm  Recent Labs: 03/27/2017: ALT 14; BUN 23; Creatinine, Ser 0.82; Hemoglobin 14.7; Platelets 247.0; Potassium 3.8; Sodium 140; TSH 0.15    Lipid Panel     Component Value Date/Time   CHOL 188 05/06/2016 0826   TRIG 77.0 05/06/2016 0826   HDL 50.70 05/06/2016 0826   CHOLHDL 4 05/06/2016 0826   VLDL 15.4 05/06/2016 0826   LDLCALC 121 (H) 05/06/2016 0826   LDLDIRECT 141.7 01/26/2010 0952     Wt Readings from Last 3 Encounters:  04/10/17 151 lb 6.4 oz (68.7 kg)  03/27/17 148 lb 2 oz (67.2 kg)  11/22/16 145 lb 9.6 oz (66 kg)      Other studies Reviewed: Additional studies/ records that were reviewed today include: Epic notes    ASSESSMENT AND PLAN:  1.  Palpitations: It is unclear at this time is to the cause of her palpitations. She does have irregular recordings on her blood pressure monitor. It is possible that she has atrial fibrillation, but she does not have many risk factors for this. We'll plan for a 30 day monitor to further determine the cause for palpitations.  2. Hypertension: Blood pressure is elevated today but normal on all of her home blood pressure checks. Continue current management.  Current medicines are reviewed at length with the patient today.   The patient does not have concerns regarding her medicines.  The following changes were made today:  none  Labs/ tests ordered today include:  Orders Placed This Encounter  Procedures  . Cardiac event monitor     Disposition:   FU with Jaydeen Odor 6 weeks  Signed, Jceon Alverio Meredith Leeds, MD  04/10/2017 2:19 PM     [facility] [address] [place] [address] [phone] (office) [phone] (fax)

## 2017-04-17 ENCOUNTER — Ambulatory Visit (INDEPENDENT_AMBULATORY_CARE_PROVIDER_SITE_OTHER): Payer: BLUE CROSS/BLUE SHIELD

## 2017-04-17 DIAGNOSIS — R002 Palpitations: Secondary | ICD-10-CM

## 2017-04-26 ENCOUNTER — Encounter: Payer: Self-pay | Admitting: Internal Medicine

## 2017-04-27 MED ORDER — HYDROCODONE-ACETAMINOPHEN 5-325 MG PO TABS: 1.0000 | ORAL_TABLET | Freq: Four times a day (QID) | ORAL | 0 refills | Status: DC | PRN

## 2017-04-27 NOTE — Telephone Encounter (Signed)
She has taken that on and off for a while, okay to refill #90, use same sig  JP

## 2017-04-27 NOTE — Telephone Encounter (Signed)
Hydrocodone 5-325mg  1 tab q6h prn #90 and 0RF printed, awaiting MD signature.

## 2017-04-27 NOTE — Telephone Encounter (Signed)
Pt informed via MyChart that Rx has been placed at front desk for pick up at her convenience.  

## 2017-05-22 ENCOUNTER — Other Ambulatory Visit: Payer: Self-pay | Admitting: Internal Medicine

## 2017-05-22 MED ORDER — SYNTHROID 88 MCG PO TABS: 88.0000 ug | ORAL_TABLET | Freq: Every day | ORAL | 0 refills | Status: DC

## 2017-05-25 NOTE — Progress Notes (Signed)
Electrophysiology Office Note   Date:  05/29/2017   ID:  Sandra Owen, DOB [birth year], MRN [id number]  PCP:  Colon Branch, MD  Primary Electrophysiologist:  Nyeemah Jennette Meredith Leeds, MD    Chief Complaint  Patient presents with  . Follow-up    Palpitations/event monitor  . Dizziness     History of Present Illness: Sandra Owen is a 66 y.o. female who is being seen today for the evaluation of palpitations at the request of Colon Branch, MD. Presenting today for electrophysiology evaluation. She continues to feel palpitations. She says that her heart beats fast mainly when she is at rest. This does not occur with exertion. She gets dizzy at times with these palpitations. It is happened to her at times when she is driving. Otherwise she feels well without major complaint.   Today, denies symptoms of chest pain, shortness of breath, orthopnea, PND, lower extremity edema, claudication, presyncope, syncope, bleeding, or neurologic sequela. The patient is tolerating medications without difficulties and is otherwise without complaint today.    Past Medical History:  Diagnosis Date  . Allergic rhinitis   . Anxiety and depression    not currently  . Hypertension   . Hypothyroidism   . PONV (postoperative nausea and vomiting)    Past Surgical History:  Procedure Laterality Date  . ANTERIOR AND POSTERIOR REPAIR N/A 06/25/2013   Procedure: ANTERIOR (CYSTOCELE) AND POSTERIOR REPAIR (RECTOCELE);  Surgeon: Gus Height, MD;  Location: [facility];  Service: Gynecology;  Laterality: N/A;  . [place]  . TONSILLECTOMY    . TUBAL LIGATION    . VAGINAL HYSTERECTOMY N/A 06/25/2013   Procedure: HYSTERECTOMY VAGINAL;  Surgeon: Gus Height, MD;  Location: [facility];  Service: Gynecology;  Laterality: N/A;     Current Outpatient Prescriptions  Medication Sig Dispense Refill  . cetirizine (ZYRTEC) 10 MG tablet Take 10 mg by mouth daily.    . hydrochlorothiazide (HYDRODIURIL) 25 MG tablet Take 1 tablet  (25 mg total) by mouth daily. 90 tablet 0  . HYDROcodone-acetaminophen (NORCO/VICODIN) 5-325 MG tablet Take 1 tablet by mouth every 6 (six) hours as needed for moderate pain. 90 tablet 0  . SYNTHROID 88 MCG tablet Take 1 tablet (88 mcg total) by mouth daily before breakfast. 90 tablet 0   No current facility-administered medications for this visit.     Allergies:   Codeine and Tape   Social History:  The patient  reports that she has never smoked. She has never used smokeless tobacco. She reports that she drinks alcohol. She reports that she does not use drugs.   Family History:  The patient's family history includes Cancer in her father; Heart disease in her brother; Hyperlipidemia in her mother; Hypertension in her mother; Pancreatic cancer in her father.    ROS:  Please see the history of present illness.   Otherwise, review of systems is positive for dizziness, palpitations.   All other systems are reviewed and negative.     PHYSICAL EXAM: VS:  BP (!) 146/90   Pulse 76   Ht 5\' 4"  (1.626 m)   Wt 151 lb (68.5 kg)   BMI 25.92 kg/m  , BMI Body mass index is 25.92 kg/m. GEN: Well nourished, well developed, in no acute distress  HEENT: normal  Neck: no JVD, carotid bruits, or masses Cardiac: RRR; no murmurs, rubs, or gallops,no edema  Respiratory:  clear to auscultation bilaterally, normal work of breathing GI: soft, nontender, nondistended, + BS MS:  no deformity or atrophy  Skin: warm and dry Neuro:  Strength and sensation are intact Psych: euthymic mood, full affect  EKG:  EKG is not ordered today. Personal review of the ekg ordered 03/27/17 shows sinus rhythm  Recent Labs: 03/27/2017: ALT 14; BUN 23; Creatinine, Ser 0.82; Hemoglobin 14.7; Platelets 247.0; Potassium 3.8; Sodium 140; TSH 0.15    Lipid Panel     Component Value Date/Time   CHOL 188 05/06/2016 0826   TRIG 77.0 05/06/2016 0826   HDL 50.70 05/06/2016 0826   CHOLHDL 4 05/06/2016 0826   VLDL 15.4 05/06/2016  0826   LDLCALC 121 (H) 05/06/2016 0826   LDLDIRECT 141.7 01/26/2010 0952     Wt Readings from Last 3 Encounters:  05/29/17 151 lb (68.5 kg)  04/10/17 151 lb 6.4 oz (68.7 kg)  03/27/17 148 lb 2 oz (67.2 kg)      Other studies Reviewed: Additional studies/ records that were reviewed today include: Epic notes   30 day monitor 05/22/17 - personally reviewed Sinus rhythm with sinus tachycardia seen. No arrhythmia.  ASSESSMENT AND PLAN:  1.  Palpitations: Monitor shows no evidence of SVT or bradycardia. That being said, she does continue to have episodes of palpitations. We Cable Fearn start her on diltiazem 120 mg see if this Emberli Ballester improve I would avoid antiarrhythmics or procedures, as no definitive tachycardia has been discovered.  2. Hypertension: elevated today, but normal on checks at home. No changes at this time.  Current medicines are reviewed at length with the patient today.   The patient does not have concerns regarding her medicines.  The following changes were made today:    Labs/ tests ordered today include:  No orders of the defined types were placed in this encounter.    Disposition:   FU with Bayli Quesinberry 6 months  Signed, Kierstin January Meredith Leeds, MD  05/29/2017 11:23 AM     [facility] [address] [place] [address] [phone] (office) [phone] (fax)

## 2017-05-26 ENCOUNTER — Other Ambulatory Visit: Payer: Self-pay

## 2017-05-26 MED ORDER — SYNTHROID 88 MCG PO TABS: 88.0000 ug | ORAL_TABLET | Freq: Every day | ORAL | 0 refills | Status: DC

## 2017-05-29 ENCOUNTER — Encounter: Payer: Self-pay | Admitting: Cardiology

## 2017-05-29 ENCOUNTER — Ambulatory Visit (INDEPENDENT_AMBULATORY_CARE_PROVIDER_SITE_OTHER): Payer: BLUE CROSS/BLUE SHIELD | Admitting: Cardiology

## 2017-05-29 VITALS — BP 146/90 | HR 76 | Ht 64.0 in | Wt 151.0 lb

## 2017-05-29 DIAGNOSIS — R002 Palpitations: Secondary | ICD-10-CM | POA: Diagnosis not present

## 2017-05-29 DIAGNOSIS — I10 Essential (primary) hypertension: Secondary | ICD-10-CM

## 2017-05-29 MED ORDER — DILTIAZEM HCL ER COATED BEADS 120 MG PO CP24: 120.0000 mg | ORAL_CAPSULE | Freq: Every day | ORAL | 1 refills | Status: DC

## 2017-05-29 MED ORDER — DILTIAZEM HCL ER COATED BEADS 120 MG PO CP24: 120.0000 mg | ORAL_CAPSULE | Freq: Every day | ORAL | 0 refills | Status: DC

## 2017-05-29 NOTE — Patient Instructions (Signed)
Medication Instructions:  Your physician has recommended you make the following change in your medication:  1. START Diltiazem 120 mg once daily  * If you need a refill on your cardiac medications before your next appointment, please call your pharmacy. *  Labwork: None ordered  Testing/Procedures: None ordered  Follow-Up: Your physician wants you to follow-up in: 6 months with Dr. Camnitz.  You will receive a reminder letter in the mail two months in advance. If you don't receive a letter, please call our office to schedule the follow-up appointment.  Thank you for choosing [facility]!!   Lindsi Bayliss, RN [phone]  Any Other Special Instructions Will Be Listed Below (If Applicable).  Diltiazem extended-release capsules or tablets What is this medicine? DILTIAZEM (dil TYE a zem) is a calcium-channel blocker. It affects the amount of calcium found in your heart and muscle cells. This relaxes your blood vessels, which can reduce the amount of work the heart has to do. This medicine is used to treat high blood pressure and chest pain caused by angina. This medicine may be used for other purposes; ask your health care provider or pharmacist if you have questions. COMMON BRAND NAME(S): Cardizem CD, Cardizem LA, Cardizem SR, Cartia XT, Dilacor XR, Dilt-CD, Diltia XT, Diltzac, Matzim LA, Taztia XT, Tiamate, Tiazac What should I tell my health care provider before I take this medicine? They need to know if you have any of these conditions: -heart problems, low blood pressure, irregular heartbeat -liver disease -previous heart attack -an unusual or allergic reaction to diltiazem, other medicines, foods, dyes, or preservatives -pregnant or trying to get pregnant -breast-feeding How should I use this medicine? Take this medicine by mouth with a glass of water. Follow the directions on the prescription label. Swallow whole, do not crush or chew. Ask your doctor or pharmacist if  your should take this medicine with food. Take your doses at regular intervals. Do not take your medicine more often then directed. Do not stop taking except on the advice of your doctor or health care professional. Ask your doctor or health care professional how to gradually reduce the dose. Talk to your pediatrician regarding the use of this medicine in children. Special care may be needed. Overdosage: If you think you have taken too much of this medicine contact a poison control center or emergency room at once. NOTE: This medicine is only for you. Do not share this medicine with others. What if I miss a dose? If you miss a dose, take it as soon as you can. If it is almost time for your next dose, take only that dose. Do not take double or extra doses. What may interact with this medicine? Do not take this medicine with any of the following medications: -cisapride -hawthorn -pimozide -ranolazine -red yeast rice This medicine may also interact with the following medications: -buspirone -carbamazepine -cimetidine -cyclosporine -digoxin -local anesthetics or general anesthetics -lovastatin -medicines for anxiety or difficulty sleeping like midazolam and triazolam -medicines for high blood pressure or heart problems -quinidine -rifampin, rifabutin, or rifapentine This list may not describe all possible interactions. Give your health care provider a list of all the medicines, herbs, non-prescription drugs, or dietary supplements you use. Also tell them if you smoke, drink alcohol, or use illegal drugs. Some items may interact with your medicine. What should I watch for while using this medicine? Check your blood pressure and pulse rate regularly. Ask your doctor or health care professional what your blood pressure   and pulse rate should be and when you should contact him or her. You may feel dizzy or lightheaded. Do not drive, use machinery, or do anything that needs mental alertness until  you know how this medicine affects you. To reduce the risk of dizzy or fainting spells, do not sit or stand up quickly, especially if you are an older patient. Alcohol can make you more dizzy or increase flushing and rapid heartbeats. Avoid alcoholic drinks. What side effects may I notice from receiving this medicine? Side effects that you should report to your doctor or health care professional as soon as possible: -allergic reactions like skin rash, itching or hives, swelling of the face, lips, or tongue -confusion, mental depression -feeling faint or lightheaded, falls -redness, blistering, peeling or loosening of the skin, including inside the mouth -slow, irregular heartbeat -swelling of the feet and ankles -unusual bleeding or bruising, pinpoint red spots on the skin Side effects that usually do not require medical attention (report to your doctor or health care professional if they continue or are bothersome): -constipation or diarrhea -difficulty sleeping -facial flushing -headache -nausea, vomiting -sexual dysfunction -weak or tired This list may not describe all possible side effects. Call your doctor for medical advice about side effects. You may report side effects to FDA at 1-800-FDA-1088. Where should I keep my medicine? Keep out of the reach of children. Store at room temperature between 15 and 30 degrees C (59 and 86 degrees F). Protect from humidity. Throw away any unused medicine after the expiration date. NOTE: This sheet is a summary. It may not cover all possible information. If you have questions about this medicine, talk to your doctor, pharmacist, or health care provider.  2018 Elsevier/Gold Standard (2008-03-27 14:35:47)       

## 2017-06-17 ENCOUNTER — Encounter: Payer: Self-pay | Admitting: Internal Medicine

## 2017-06-25 ENCOUNTER — Other Ambulatory Visit: Payer: Self-pay | Admitting: Cardiology

## 2017-06-29 ENCOUNTER — Other Ambulatory Visit: Payer: Self-pay | Admitting: Internal Medicine

## 2017-06-29 MED ORDER — HYDROCODONE-ACETAMINOPHEN 5-325 MG PO TABS: 1.0000 | ORAL_TABLET | Freq: Four times a day (QID) | ORAL | 0 refills | Status: DC | PRN

## 2017-06-29 NOTE — Telephone Encounter (Signed)
Pt informed that Rx has been placed at front desk for pick up at her convenience.

## 2017-06-29 NOTE — Telephone Encounter (Signed)
Rx printed, awaiting MD signature.  

## 2017-06-29 NOTE — Telephone Encounter (Signed)
Pt is requesting refill on hydrocodone 5-325mg .  Last OV: 03/27/2017 Last Fill: 04/27/2017 #90 and 0RF UDS: 01/24/2017 Low risk  [place] Controlled Substance Database printed; no issues noted.   Please advise.

## 2017-06-29 NOTE — Telephone Encounter (Signed)
Okay #90, no refills 

## 2017-07-05 ENCOUNTER — Other Ambulatory Visit (INDEPENDENT_AMBULATORY_CARE_PROVIDER_SITE_OTHER): Payer: BLUE CROSS/BLUE SHIELD

## 2017-07-05 DIAGNOSIS — E039 Hypothyroidism, unspecified: Secondary | ICD-10-CM

## 2017-07-05 LAB — TSH: TSH: 13.98 u[IU]/mL — ABNORMAL HIGH (ref 0.35–4.50)

## 2017-07-15 ENCOUNTER — Other Ambulatory Visit: Payer: Self-pay | Admitting: Internal Medicine

## 2017-07-17 MED ORDER — HYDROCHLOROTHIAZIDE 25 MG PO TABS: 25.0000 mg | ORAL_TABLET | Freq: Every day | ORAL | 0 refills | Status: DC

## 2017-08-24 ENCOUNTER — Other Ambulatory Visit: Payer: Self-pay | Admitting: Internal Medicine

## 2017-08-24 NOTE — Telephone Encounter (Signed)
Rx sent 

## 2017-08-24 NOTE — Telephone Encounter (Signed)
Okay to send a month supply

## 2017-08-24 NOTE — Telephone Encounter (Signed)
Pt requesting refill on Synthroid 40mcg tabs. Pt is seeing Loanne Drilling on 08/29/2017 for NP appt for hypothyroidism. Please advise.

## 2017-08-26 ENCOUNTER — Other Ambulatory Visit: Payer: Self-pay | Admitting: Internal Medicine

## 2017-08-28 MED ORDER — HYDROCODONE-ACETAMINOPHEN 5-325 MG PO TABS: 1.0000 | ORAL_TABLET | Freq: Four times a day (QID) | ORAL | 0 refills | Status: DC | PRN

## 2017-08-28 NOTE — Telephone Encounter (Signed)
Pt is requesting refill on Hydrocodone 5-325mg .  Last OV: 03/27/2017  Last Fill: 06/29/2017 #90 and 0RF  UDS: 01/23/2017 Low risk  [place] database 06/29/2017 (in media); no issues noted  Please advise.

## 2017-08-28 NOTE — Telephone Encounter (Signed)
Pt informed via MyChart that Rx has been placed at front desk for pick up at her convenience.  

## 2017-08-28 NOTE — Telephone Encounter (Signed)
Okay #90, no refills 

## 2017-08-28 NOTE — Telephone Encounter (Signed)
Rx printed, awaiting MD signature.  

## 2017-08-29 ENCOUNTER — Encounter: Payer: Self-pay | Admitting: Endocrinology

## 2017-08-29 ENCOUNTER — Ambulatory Visit (INDEPENDENT_AMBULATORY_CARE_PROVIDER_SITE_OTHER): Payer: BLUE CROSS/BLUE SHIELD | Admitting: Endocrinology

## 2017-08-29 VITALS — BP 138/88 | HR 69 | Wt 151.4 lb

## 2017-08-29 DIAGNOSIS — E039 Hypothyroidism, unspecified: Secondary | ICD-10-CM

## 2017-08-29 MED ORDER — SYNTHROID 88 MCG PO TABS: 88.0000 ug | ORAL_TABLET | Freq: Every day | ORAL | 11 refills | Status: DC

## 2017-08-29 NOTE — Patient Instructions (Addendum)
Your thyroid is not far enough off to explain your loss of voice, so you should see a specialist for this.  Please let Dr Larose Kells or I know if you want to have a referral.  blood tests are requested for you today.  We'll let you know about the results.  For now, we'll try to make small adjustments in the amount of medication.       Levothyroxine tablets What is this medicine? LEVOTHYROXINE (lee voe thye ROX een) is a thyroid hormone. This medicine can improve symptoms of thyroid deficiency such as slow speech, lack of energy, weight gain, hair loss, dry skin, and feeling cold. It also helps to treat goiter (an enlarged thyroid gland). It is also used to treat some kinds of thyroid cancer along with surgery and other medicines. This medicine may be used for other purposes; ask your health care provider or pharmacist if you have questions. COMMON BRAND NAME(S): Estre, Levo-T, Levothroid, Levoxyl, Synthroid, Thyro-Tabs, Unithroid What should I tell my health care provider before I take this medicine? They need to know if you have any of these conditions: -angina -blood clotting problems -diabetes -dieting or on a weight loss program -fertility problems -heart disease -high levels of thyroid hormone -pituitary gland problem -previous heart attack -an unusual or allergic reaction to levothyroxine, thyroid hormones, other medicines, foods, dyes, or preservatives -pregnant or trying to get pregnant -breast-feeding How should I use this medicine? Take this medicine by mouth with plenty of water. It is best to take on an empty stomach, at least 30 minutes before or 2 hours after food. Follow the directions on the prescription label. Take at the same time each day. Do not take your medicine more often than directed. Contact your pediatrician regarding the use of this medicine in children. While this drug may be prescribed for children and infants as young as a few days of age for selected conditions,  precautions do apply. For infants, you may crush the tablet and place in a small amount of (5-10 ml or 1 to 2 teaspoonfuls) of water, breast milk, or non-soy based infant formula. Do not mix with soy-based infant formula. Give as directed. Overdosage: If you think you have taken too much of this medicine contact a poison control center or emergency room at once. NOTE: This medicine is only for you. Do not share this medicine with others. What if I miss a dose? If you miss a dose, take it as soon as you can. If it is almost time for your next dose, take only that dose. Do not take double or extra doses. What may interact with this medicine? -amiodarone -antacids -anti-thyroid medicines -calcium supplements -carbamazepine -cholestyramine -colestipol -digoxin -female hormones, including contraceptive or birth control pills -iron supplements -ketamine -liquid nutrition products like Ensure -medicines for colds and breathing difficulties -medicines for diabetes -medicines for mental depression -medicines or herbals used to decrease weight or appetite -phenobarbital or other barbiturate medications -phenytoin -prednisone or other corticosteroids -rifabutin -rifampin -soy isoflavones -sucralfate -theophylline -warfarin This list may not describe all possible interactions. Give your health care provider a list of all the medicines, herbs, non-prescription drugs, or dietary supplements you use. Also tell them if you smoke, drink alcohol, or use illegal drugs. Some items may interact with your medicine. What should I watch for while using this medicine? Be sure to take this medicine with plenty of fluids. Some tablets may cause choking, gagging, or difficulty swallowing from the tablet getting stuck in your  throat. Most of these problems disappear if the medicine is taken with the right amount of water or other fluids. Do not switch brands of this medicine unless your health care professional  agrees with the change. Ask questions if you are uncertain. You will need regular exams and occasional blood tests to check the response to treatment. If you are receiving this medicine for an underactive thyroid, it may be several weeks before you notice an improvement. Check with your doctor or health care professional if your symptoms do not improve. It may be necessary for you to take this medicine for the rest of your life. Do not stop using this medicine unless your doctor or health care professional advises you to. This medicine can affect blood sugar levels. If you have diabetes, check your blood sugar as directed. You may lose some of your hair when you first start treatment. With time, this usually corrects itself. If you are going to have surgery, tell your doctor or health care professional that you are taking this medicine. What side effects may I notice from receiving this medicine? Side effects that you should report to your doctor or health care professional as soon as possible: -allergic reactions like skin rash, itching or hives, swelling of the face, lips, or tongue -chest pain -excessive sweating or intolerance to heat -fast or irregular heartbeat -nervousness -skin rash or hives -swelling of ankles, feet, or legs -tremors Side effects that usually do not require medical attention (report to your doctor or health care professional if they continue or are bothersome): -changes in appetite -changes in menstrual periods -diarrhea -hair loss -headache -trouble sleeping -weight loss This list may not describe all possible side effects. Call your doctor for medical advice about side effects. You may report side effects to FDA at 1-800-FDA-1088. Where should I keep my medicine? Keep out of the reach of children. Store at room temperature between 15 and 30 degrees C (59 and 86 degrees F). Protect from light and moisture. Keep container tightly closed. Throw away any unused  medicine after the expiration date. NOTE: This sheet is a summary. It may not cover all possible information. If you have questions about this medicine, talk to your doctor, pharmacist, or health care provider.  2018 Elsevier/Gold Standard (2009-03-13 14:28:07)

## 2017-08-29 NOTE — Progress Notes (Signed)
Subjective:    Patient ID: Sandra Owen, female    DOB: Feb 28, 1951, 66 y.o.   MRN: [id number]  HPI Pt is referred by Dr Larose Kells, for hypothyroidism.  Pt reports hypothyroidism was dx'ed in approx 1975.  she has been on prescribed thyroid hormone therapy since then.  she has never taken kelp or any other type of non-prescribed thyroid product.  she has never had thyroid imaging.  she has never had thyroid surgery, or XRT to the neck.  she has never been on amiodarone or lithium.  She has slight hoarseness sensation in the neck, and assoc fatigue.  He dosage requiremnt has varied from 88-150 mcg/day.  She has been on her current 88 mcg x 6 months, which she never misses.  She takes with water only, on an empty stomach.  Past Medical History:  Diagnosis Date  . Allergic rhinitis   . Anxiety and depression    not currently  . Hypertension   . Hypothyroidism   . PONV (postoperative nausea and vomiting)     Past Surgical History:  Procedure Laterality Date  . ANTERIOR AND POSTERIOR REPAIR N/A 06/25/2013   Procedure: ANTERIOR (CYSTOCELE) AND POSTERIOR REPAIR (RECTOCELE);  Surgeon: Gus Height, MD;  Location: [facility];  Service: Gynecology;  Laterality: N/A;  . [place]  . TONSILLECTOMY    . TUBAL LIGATION    . VAGINAL HYSTERECTOMY N/A 06/25/2013   Procedure: HYSTERECTOMY VAGINAL;  Surgeon: Gus Height, MD;  Location: [facility];  Service: Gynecology;  Laterality: N/A;    Social History   Social History  . Marital status: Married    Spouse name: N/A  . Number of children: 1  . Years of education: N/A   Occupational History  . sales / customer service    Social History Main Topics  . Smoking status: Never Smoker  . Smokeless tobacco: Never Used  . Alcohol use Yes     Comment: socially  . Drug use: No  . Sexual activity: Not on file   Other Topics Concern  . Not on file   Social History Narrative   Lives w/ husband    Current Outpatient Prescriptions on File Prior to Visit    Medication Sig Dispense Refill  . cetirizine (ZYRTEC) 10 MG tablet Take 10 mg by mouth daily.    . hydrochlorothiazide (HYDRODIURIL) 25 MG tablet Take 1 tablet (25 mg total) by mouth daily. 90 tablet 0  . HYDROcodone-acetaminophen (NORCO/VICODIN) 5-325 MG tablet Take 1 tablet by mouth every 6 (six) hours as needed for moderate pain. 90 tablet 0   No current facility-administered medications on file prior to visit.     Allergies  Allergen Reactions  . Codeine Nausea Only  . Tape Other (See Comments)    Dermatitis    Family History  Problem Relation Age of Onset  . Pancreatic cancer Father   . Cancer Father   . Hyperlipidemia Mother   . Hypertension Mother   . Heart disease Brother        age 18  . Colon cancer Neg Hx   . Breast cancer Neg Hx   . Thyroid disease Neg Hx     BP 138/88   Pulse 69   Wt 151 lb 6.4 oz (68.7 kg)   SpO2 97%   BMI 25.99 kg/m     Review of Systems denies depression, muscle cramps, sob, memory loss, constipation, numbness, diplopia, myalgias, easy bruising, and syncope.  She has dry skin, cold intolerance,  weight gain, and rhinorrhea.    Objective:   Physical Exam VS: see vs page GEN: no distress HEAD: head: no deformity eyes: no periorbital swelling, no proptosis external nose and ears are normal mouth: no lesion seen NECK: supple, thyroid is not enlarged CHEST WALL: no deformity LUNGS: clear to auscultation CV: reg rate and rhythm, no murmur ABD: abdomen is soft, nontender.  no hepatosplenomegaly.  not distended.  no hernia MUSCULOSKELETAL: muscle bulk and strength are grossly normal.  no obvious joint swelling.  gait is normal and steady EXTEMITIES: no deformity.  no ulcer on the feet.  feet are of normal color and temp.  no edema PULSES: dorsalis pedis intact bilat.  no carotid bruit NEURO:  cn 2-12 grossly intact, except for hoarseness.   readily moves all 4's.  sensation is intact to touch on the feet SKIN:  Normal texture and  temperature.  No rash or suspicious lesion is visible.   NODES:  None palpable at the neck PSYCH: alert, well-oriented.  Does not appear anxious nor depressed.  I have reviewed outside records, and summarized: Pt was noted to have fluctuating TSH, and referred here.  She was also seen by cardiol for EP eval of palpitations.       Assessment & Plan:  Hypothyroidism, new to me: recheck today.  I told pt we'll have to follow this together for a few months. Hoarseness: not thyroid-related.  Should be eval as a separate problem, if clinically indicated.   Patient Instructions  Your thyroid is not far enough off to explain your loss of voice, so you should see a specialist for this.  Please let Dr Larose Kells or I know if you want to have a referral.  blood tests are requested for you today.  We'll let you know about the results.  For now, we'll try to make small adjustments in the amount of medication.       Levothyroxine tablets What is this medicine? LEVOTHYROXINE (lee voe thye ROX een) is a thyroid hormone. This medicine can improve symptoms of thyroid deficiency such as slow speech, lack of energy, weight gain, hair loss, dry skin, and feeling cold. It also helps to treat goiter (an enlarged thyroid gland). It is also used to treat some kinds of thyroid cancer along with surgery and other medicines. This medicine may be used for other purposes; ask your health care provider or pharmacist if you have questions. COMMON BRAND NAME(S): Estre, Levo-T, Levothroid, Levoxyl, Synthroid, Thyro-Tabs, Unithroid What should I tell my health care provider before I take this medicine? They need to know if you have any of these conditions: -angina -blood clotting problems -diabetes -dieting or on a weight loss program -fertility problems -heart disease -high levels of thyroid hormone -pituitary gland problem -previous heart attack -an unusual or allergic reaction to levothyroxine, thyroid hormones, other  medicines, foods, dyes, or preservatives -pregnant or trying to get pregnant -breast-feeding How should I use this medicine? Take this medicine by mouth with plenty of water. It is best to take on an empty stomach, at least 30 minutes before or 2 hours after food. Follow the directions on the prescription label. Take at the same time each day. Do not take your medicine more often than directed. Contact your pediatrician regarding the use of this medicine in children. While this drug may be prescribed for children and infants as young as a few days of age for selected conditions, precautions do apply. For infants, you may crush the tablet  and place in a small amount of (5-10 ml or 1 to 2 teaspoonfuls) of water, breast milk, or non-soy based infant formula. Do not mix with soy-based infant formula. Give as directed. Overdosage: If you think you have taken too much of this medicine contact a poison control center or emergency room at once. NOTE: This medicine is only for you. Do not share this medicine with others. What if I miss a dose? If you miss a dose, take it as soon as you can. If it is almost time for your next dose, take only that dose. Do not take double or extra doses. What may interact with this medicine? -amiodarone -antacids -anti-thyroid medicines -calcium supplements -carbamazepine -cholestyramine -colestipol -digoxin -female hormones, including contraceptive or birth control pills -iron supplements -ketamine -liquid nutrition products like Ensure -medicines for colds and breathing difficulties -medicines for diabetes -medicines for mental depression -medicines or herbals used to decrease weight or appetite -phenobarbital or other barbiturate medications -phenytoin -prednisone or other corticosteroids -rifabutin -rifampin -soy isoflavones -sucralfate -theophylline -warfarin This list may not describe all possible interactions. Give your health care provider a list of  all the medicines, herbs, non-prescription drugs, or dietary supplements you use. Also tell them if you smoke, drink alcohol, or use illegal drugs. Some items may interact with your medicine. What should I watch for while using this medicine? Be sure to take this medicine with plenty of fluids. Some tablets may cause choking, gagging, or difficulty swallowing from the tablet getting stuck in your throat. Most of these problems disappear if the medicine is taken with the right amount of water or other fluids. Do not switch brands of this medicine unless your health care professional agrees with the change. Ask questions if you are uncertain. You will need regular exams and occasional blood tests to check the response to treatment. If you are receiving this medicine for an underactive thyroid, it may be several weeks before you notice an improvement. Check with your doctor or health care professional if your symptoms do not improve. It may be necessary for you to take this medicine for the rest of your life. Do not stop using this medicine unless your doctor or health care professional advises you to. This medicine can affect blood sugar levels. If you have diabetes, check your blood sugar as directed. You may lose some of your hair when you first start treatment. With time, this usually corrects itself. If you are going to have surgery, tell your doctor or health care professional that you are taking this medicine. What side effects may I notice from receiving this medicine? Side effects that you should report to your doctor or health care professional as soon as possible: -allergic reactions like skin rash, itching or hives, swelling of the face, lips, or tongue -chest pain -excessive sweating or intolerance to heat -fast or irregular heartbeat -nervousness -skin rash or hives -swelling of ankles, feet, or legs -tremors Side effects that usually do not require medical attention (report to your  doctor or health care professional if they continue or are bothersome): -changes in appetite -changes in menstrual periods -diarrhea -hair loss -headache -trouble sleeping -weight loss This list may not describe all possible side effects. Call your doctor for medical advice about side effects. You may report side effects to FDA at 1-800-FDA-1088. Where should I keep my medicine? Keep out of the reach of children. Store at room temperature between 15 and 30 degrees C (59 and 86 degrees F). Protect from  light and moisture. Keep container tightly closed. Throw away any unused medicine after the expiration date. NOTE: This sheet is a summary. It may not cover all possible information. If you have questions about this medicine, talk to your doctor, pharmacist, or health care provider.  2018 Elsevier/Gold Standard (2009-03-13 14:28:07)

## 2017-08-30 ENCOUNTER — Other Ambulatory Visit (INDEPENDENT_AMBULATORY_CARE_PROVIDER_SITE_OTHER): Payer: BLUE CROSS/BLUE SHIELD

## 2017-08-30 DIAGNOSIS — E039 Hypothyroidism, unspecified: Secondary | ICD-10-CM | POA: Diagnosis not present

## 2017-08-30 LAB — T4, FREE: Free T4: 0.82 ng/dL (ref 0.60–1.60)

## 2017-08-30 LAB — T3, FREE: T3, Free: 2.6 pg/mL (ref 2.3–4.2)

## 2017-08-30 LAB — TSH: TSH: 1.53 u[IU]/mL (ref 0.35–4.50)

## 2017-09-01 ENCOUNTER — Telehealth: Payer: Self-pay | Admitting: Internal Medicine

## 2017-09-01 ENCOUNTER — Other Ambulatory Visit: Payer: Self-pay

## 2017-09-01 MED ORDER — SYNTHROID 88 MCG PO TABS: 88.0000 ug | ORAL_TABLET | Freq: Every day | ORAL | 6 refills | Status: DC

## 2017-09-01 NOTE — Telephone Encounter (Signed)
Error

## 2017-10-26 ENCOUNTER — Other Ambulatory Visit: Payer: Self-pay | Admitting: Internal Medicine

## 2017-10-27 MED ORDER — HYDROCODONE-ACETAMINOPHEN 5-325 MG PO TABS: 1.0000 | ORAL_TABLET | Freq: Four times a day (QID) | ORAL | 0 refills | Status: DC | PRN

## 2017-10-27 MED ORDER — HYDROCHLOROTHIAZIDE 25 MG PO TABS: 25.0000 mg | ORAL_TABLET | Freq: Every day | ORAL | 0 refills | Status: DC

## 2017-10-27 NOTE — Telephone Encounter (Signed)
Rx printed, awaiting MD signature.  

## 2017-10-27 NOTE — Telephone Encounter (Signed)
Okay #90, no refills. Advice patient, due for a CPX, please arrange

## 2017-10-27 NOTE — Telephone Encounter (Signed)
Pt is requesting refill on hydrocodone-acetaminophen 5-325mg .   Last OV: 03/27/2017, no future appt scheduled Last Fill: 08/28/2017 #90 and 0RF (Pt sig: 1 tab q6h prn) UDS: 01/24/2017 Low risk  NCCR printed; no issues noted.   Please advise.

## 2017-11-22 ENCOUNTER — Ambulatory Visit: Payer: BLUE CROSS/BLUE SHIELD | Admitting: Internal Medicine

## 2017-11-22 ENCOUNTER — Encounter: Payer: Self-pay | Admitting: Internal Medicine

## 2017-11-22 VITALS — BP 122/80 | HR 89 | Temp 98.0°F | Resp 14 | Ht 64.0 in | Wt 147.5 lb

## 2017-11-22 DIAGNOSIS — E039 Hypothyroidism, unspecified: Secondary | ICD-10-CM | POA: Diagnosis not present

## 2017-11-22 DIAGNOSIS — Z1159 Encounter for screening for other viral diseases: Secondary | ICD-10-CM

## 2017-11-22 DIAGNOSIS — Z0000 Encounter for general adult medical examination without abnormal findings: Secondary | ICD-10-CM

## 2017-11-22 NOTE — Patient Instructions (Signed)
GO TO THE LAB : Get the blood work     GO TO THE FRONT DESK Schedule your next appointment for a  Check up in 6 months   

## 2017-11-22 NOTE — Progress Notes (Signed)
Pre visit review using our clinic review tool, if applicable. No additional management support is needed unless otherwise documented below in the visit note. 

## 2017-11-22 NOTE — Assessment & Plan Note (Signed)
-  Td 2011; pnm shots, shingrix, flu shot : declined, discussed benefits -CCS: IFOB (-) 2017; 3 modalities discussed today, elected  an IFOB -female care: to see gyn soon, plans to have her MMG-PAP then - dexa discussed , had one 15 years ago, declined a referral, rec ca and vit D -Diet exercise discussed -Labs: CMP, FLP, TSH, hep C, iFOB

## 2017-11-22 NOTE — Progress Notes (Signed)
Subjective:    Patient ID: Sandra Owen, female    DOB: [birth year], 66 y.o.   MRN: [id number]  DOS:  11/22/2017 Type of visit - description : cpx Interval history: In genera; feeling well   Review of Systems Was seen w/ fatigue, doing better, less tired Was seen with palpitations, chart reviewed.  Symptoms decreased. Recovering from shoulder surgery  Other than above, a 14 point review of systems is negative    Past Medical History:  Diagnosis Date  . Allergic rhinitis   . Anxiety and depression    not currently  . Hypertension   . Hypothyroidism   . PONV (postoperative nausea and vomiting)     Past Surgical History:  Procedure Laterality Date  . ANTERIOR AND POSTERIOR REPAIR N/A 06/25/2013   Procedure: ANTERIOR (CYSTOCELE) AND POSTERIOR REPAIR (RECTOCELE);  Surgeon: Gus Height, MD;  Location: [facility];  Service: Gynecology;  Laterality: N/A;  . [place]  . TONSILLECTOMY    . TUBAL LIGATION    . VAGINAL HYSTERECTOMY N/A 06/25/2013   Procedure: HYSTERECTOMY VAGINAL;  Surgeon: Gus Height, MD;  Location: [facility];  Service: Gynecology;  Laterality: N/A;    Social History   Socioeconomic History  . Marital status: Married    Spouse name: Not on file  . Number of children: 1  . Years of education: Not on file  . Highest education level: Not on file  Social Needs  . Financial resource strain: Not on file  . Food insecurity - worry: Not on file  . Food insecurity - inability: Not on file  . Transportation needs - medical: Not on file  . Transportation needs - non-medical: Not on file  Occupational History  . Occupation: Press photographer / Therapist, art  Tobacco Use  . Smoking status: Never Smoker  . Smokeless tobacco: Never Used  Substance and Sexual Activity  . Alcohol use: Yes    Comment: socially  . Drug use: No  . Sexual activity: Not on file  Other Topics Concern  . Not on file  Social History Narrative   Lives w/ husband      Allergies as of  11/22/2017      Reactions   Codeine Nausea Only   Tape Other (See Comments)   Dermatitis      Medication List        Accurate as of 11/22/17  2:04 PM. Always use your most recent med list.          cetirizine 10 MG tablet Commonly known as:  ZYRTEC Take 10 mg by mouth daily.   hydrochlorothiazide 25 MG tablet Commonly known as:  HYDRODIURIL Take 1 tablet (25 mg total) daily by mouth.   HYDROcodone-acetaminophen 5-325 MG tablet Commonly known as:  NORCO/VICODIN Take 1 tablet every 6 (six) hours as needed by mouth for moderate pain.   SYNTHROID 88 MCG tablet Generic drug:  levothyroxine Take 1 tablet (88 mcg total) by mouth daily before breakfast.          Objective:   Physical Exam BP 122/80 (BP Location: Left Arm, Patient Position: Sitting, Cuff Size: Small)   Pulse 89   Temp 98 F (36.7 C) (Oral)   Resp 14   Ht 5\' 4"  (1.626 m)   Wt 147 lb 8 oz (66.9 kg)   SpO2 90%   BMI 25.32 kg/m  General:   Well developed, well nourished . NAD.  Neck: No  thyromegaly  HEENT:  Normocephalic . Face symmetric, atraumatic  Lungs:  CTA B Normal respiratory effort, no intercostal retractions, no accessory muscle use. Heart: RRR,  no murmur.  No pretibial edema bilaterally  Abdomen:  Not distended, soft, non-tender. No rebound or rigidity.   Skin: Exposed areas without rash. Not pale. Not jaundice Neurologic:  alert & oriented X3.  Speech normal, gait appropriate for age and unassisted Strength symmetric and appropriate for age.  Psych: Cognition and judgment appear intact.  Cooperative with normal attention span and concentration.  Behavior appropriate. No anxious or depressed appearing.     Assessment & Plan:   Assessment   HTN Hypothyroidism DJD--shoulder, neck,  on hydrocodone H/o Anxiety depression Shingles 12-2015   PLAN See last visit: Palpitations: labs okay, saw cardiology 05/29/2017, monitor show no SVT or bradycardia, rx  Diltiazem, she took it  temporarily but did not like it.  Currently not taking diltiazem; sx decreased, thus observation Fatigue: P92 folic acid and vitamin D were negative. Currently doing Sandra Hypothyroidism: Saw endo, if needed to see endo again , request a different provider.  Currently on Synthroid 0.88 mcg daily, using a branded product, check a TSH.  Further advise by result.  Knows  take it on empty stomach away from antiacid and other medications. change to generic synthroid? Will wait till next RF  DJD: She has had shoulder surgery a week ago RTC 6 months

## 2017-11-23 LAB — COMPREHENSIVE METABOLIC PANEL
ALT: 11 U/L (ref 0–35)
AST: 15 U/L (ref 0–37)
Albumin: 4.8 g/dL (ref 3.5–5.2)
Alkaline Phosphatase: 61 U/L (ref 39–117)
BUN: 21 mg/dL (ref 6–23)
CO2: 30 mEq/L (ref 19–32)
Calcium: 9.8 mg/dL (ref 8.4–10.5)
Chloride: 102 mEq/L (ref 96–112)
Creatinine, Ser: 0.77 mg/dL (ref 0.40–1.20)
GFR: 79.93 mL/min (ref >60.00)
Glucose, Bld: 86 mg/dL (ref 70–99)
Potassium: 3.9 mEq/L (ref 3.5–5.1)
Sodium: 143 mEq/L (ref 135–145)
Total Bilirubin: 0.9 mg/dL (ref 0.2–1.2)
Total Protein: 7.1 g/dL (ref 6.0–8.3)

## 2017-11-23 LAB — LIPID PANEL
Cholesterol: 216 mg/dL — ABNORMAL HIGH (ref 0–200)
HDL: 70.3 mg/dL (ref >39.00)
LDL Cholesterol: 126 mg/dL — ABNORMAL HIGH (ref 0–99)
NonHDL: 145.58
Total CHOL/HDL Ratio: 3
Triglycerides: 99 mg/dL (ref 0.0–149.0)
VLDL: 19.8 mg/dL (ref 0.0–40.0)

## 2017-11-23 LAB — HEPATITIS C ANTIBODY
Hepatitis C Ab: NONREACTIVE
SIGNAL TO CUT-OFF: 0.01 (ref <1.00)

## 2017-11-23 LAB — TSH: TSH: 0.69 u[IU]/mL (ref 0.35–4.50)

## 2017-11-23 NOTE — Assessment & Plan Note (Signed)
See last visit: Palpitations: labs okay, saw cardiology 05/29/2017, monitor show no SVT or bradycardia, rx  Diltiazem, she took it temporarily but did not like it.  Currently not taking diltiazem; sx decreased, thus observation Fatigue: P53 folic acid and vitamin D were negative. Currently doing ok Hypothyroidism: Saw endo, if needed to see endo again , request a different provider.  Currently on Synthroid 0.88 mcg daily, using a branded product, check a TSH.  Further advise by result.  Knows  take it on empty stomach away from antiacid and other medications. change to generic synthroid? Will wait till next RF  DJD: She has had shoulder surgery a week ago RTC 6 months

## 2017-11-24 NOTE — Addendum Note (Signed)
Addended byDamita Dunnings D on: 11/24/2017 08:50 AM   Modules accepted: Orders

## 2018-01-21 ENCOUNTER — Telehealth: Payer: Self-pay | Admitting: Internal Medicine

## 2018-01-22 MED ORDER — HYDROCODONE-ACETAMINOPHEN 5-325 MG PO TABS: 1.0000 | ORAL_TABLET | Freq: Four times a day (QID) | ORAL | 0 refills | Status: DC | PRN

## 2018-01-22 NOTE — Telephone Encounter (Signed)
RF today, UDS on RTC that should be by 05/2018

## 2018-01-22 NOTE — Telephone Encounter (Signed)
Requesting: hydrocodone Contract: 09/06/16 UDS: 01/24/17 Last Visit: 11/22/17 Next Visit: none Last Refill: 10/27/17  Please Advise

## 2018-01-31 ENCOUNTER — Telehealth: Payer: Self-pay | Admitting: Internal Medicine

## 2018-01-31 MED ORDER — HYDROCODONE-ACETAMINOPHEN 5-325 MG PO TABS: 1.0000 | ORAL_TABLET | Freq: Four times a day (QID) | ORAL | 0 refills | Status: DC | PRN

## 2018-01-31 NOTE — Telephone Encounter (Signed)
We sent #90 tablets 01/22/18, too early

## 2018-01-31 NOTE — Telephone Encounter (Signed)
Ok, will do, rx sent

## 2018-01-31 NOTE — Telephone Encounter (Signed)
[facility] [address] [facility] not [facility].

## 2018-01-31 NOTE — Telephone Encounter (Signed)
Copied from [place]. Topic: Quick Communication - See Telephone Encounter >> Jan 31, 2018  3:36 PM Cl[place], Laban Emperor wrote: CRM for notification. See Telephone encounter for:  [facility] [place] [facility] of her HYDROcodone-acetaminophen (NORCO/VICODIN) 5-325 MG tablet [[id number]], backorder for 2 weeks.  She would like the Rx to be called into: [facility] [address], [place], [place] [address] [phone] 01/31/18.

## 2018-01-31 NOTE — Telephone Encounter (Signed)
Please advise 

## 2018-02-13 ENCOUNTER — Encounter: Payer: Self-pay | Admitting: Internal Medicine

## 2018-02-14 MED ORDER — LEVOTHYROXINE SODIUM 88 MCG PO TABS: 88.0000 ug | ORAL_TABLET | Freq: Every day | ORAL | 1 refills | Status: DC

## 2018-02-14 MED ORDER — HYDROCHLOROTHIAZIDE 25 MG PO TABS: 25.0000 mg | ORAL_TABLET | Freq: Every day | ORAL | 1 refills | Status: DC

## 2018-03-28 ENCOUNTER — Telehealth: Payer: Self-pay | Admitting: Internal Medicine

## 2018-03-29 MED ORDER — HYDROCODONE-ACETAMINOPHEN 5-325 MG PO TABS: 1.0000 | ORAL_TABLET | Freq: Four times a day (QID) | ORAL | 0 refills | Status: DC | PRN

## 2018-03-29 NOTE — Telephone Encounter (Signed)
Pt is requesting refill on hydrocodone.   Last OV: 11/22/2017 Last Fill: 01/31/2018 #90 and 0RF (Pt sig: 1 tab q6h prn) UDS: 01/23/2017 Low risk  NCCR printed- no discrepancies noted- sent for scanning.   Please advise.

## 2018-03-29 NOTE — Telephone Encounter (Signed)
Sent!

## 2018-05-31 ENCOUNTER — Other Ambulatory Visit: Payer: Self-pay | Admitting: Internal Medicine

## 2018-05-31 DIAGNOSIS — M542 Cervicalgia: Secondary | ICD-10-CM

## 2018-06-01 MED ORDER — HYDROCODONE-ACETAMINOPHEN 5-325 MG PO TABS: 1.0000 | ORAL_TABLET | Freq: Four times a day (QID) | ORAL | 0 refills | Status: DC | PRN

## 2018-06-01 NOTE — Telephone Encounter (Signed)
Pt due for follow-up before next refill, please call and schedule appt. Thank you.

## 2018-06-01 NOTE — Telephone Encounter (Signed)
Requesting: norco 5-325mg  1 tab every 6hrs prn Contract: yes, 2016 UDS: 08/25/15 Last OV: 11/22/2017 Next OV: N/A Last refill: 03/29/2018 #90, 0RF Database: OD risk score 360/999, report printed out for MD review.  Please advise.

## 2018-06-01 NOTE — Telephone Encounter (Signed)
RX sent, please make patient aware that she is due for a visit at her convenience

## 2018-06-01 NOTE — Telephone Encounter (Signed)
Called pt and informed her that her refill was sent in. However, she would need to come in and be seen before any further refills could be sent in. Pt stated that she was at work and that she would call back and schedule once she looked at her schedule

## 2018-06-04 NOTE — Telephone Encounter (Signed)
Addendum: Also I noted 3 prescriptions for phentermine in her PMP.

## 2018-08-15 ENCOUNTER — Ambulatory Visit: Payer: BLUE CROSS/BLUE SHIELD | Admitting: Internal Medicine

## 2018-08-15 ENCOUNTER — Encounter: Payer: Self-pay | Admitting: Internal Medicine

## 2018-08-15 VITALS — BP 120/74 | HR 75 | Temp 98.1°F | Resp 16 | Ht 64.0 in | Wt 133.8 lb

## 2018-08-15 DIAGNOSIS — I10 Essential (primary) hypertension: Secondary | ICD-10-CM | POA: Diagnosis not present

## 2018-08-15 DIAGNOSIS — E039 Hypothyroidism, unspecified: Secondary | ICD-10-CM | POA: Diagnosis not present

## 2018-08-15 DIAGNOSIS — M542 Cervicalgia: Secondary | ICD-10-CM

## 2018-08-15 DIAGNOSIS — Z79899 Other long term (current) drug therapy: Secondary | ICD-10-CM

## 2018-08-15 LAB — TSH: TSH: 1.58 u[IU]/mL (ref 0.35–4.50)

## 2018-08-15 MED ORDER — HYDROCHLOROTHIAZIDE 25 MG PO TABS: 25.0000 mg | ORAL_TABLET | Freq: Every day | ORAL | 1 refills | Status: DC

## 2018-08-15 MED ORDER — HYDROCODONE-ACETAMINOPHEN 5-325 MG PO TABS: 1.0000 | ORAL_TABLET | Freq: Every day | ORAL | 0 refills | Status: DC | PRN

## 2018-08-15 MED ORDER — LEVOTHYROXINE SODIUM 88 MCG PO TABS: 88.0000 ug | ORAL_TABLET | Freq: Every day | ORAL | 1 refills | Status: DC

## 2018-08-15 NOTE — Assessment & Plan Note (Addendum)
Hypothyroidism: Good compliance with generic Synthroid, check a TSH, RF with results HTN: On HCTZ, refill sent, last BMP ok, seems well controlled DJD: On hydrocodone as needed, Rx sent, on average take 1 tablet 3 or 4 times a week.  RTC 11/2018

## 2018-08-15 NOTE — Progress Notes (Signed)
Subjective:    Patient ID: Sandra Owen, female    DOB: May 27, 1951, 67 y.o.   MRN: [id number]  DOS:  08/15/2018 Type of visit - description : rov Interval history: Hypothyroidism good compliance with medication, needs a refill Pain management: Request a hydrocodone refill   Review of Systems Denies chest pain or difficulty breathing No palpitations History of fatigue, energy level at this point is satisfactory  Past Medical History:  Diagnosis Date  . Allergic rhinitis   . Anxiety and depression    not currently  . Hypertension   . Hypothyroidism   . PONV (postoperative nausea and vomiting)     Past Surgical History:  Procedure Laterality Date  . ANTERIOR AND POSTERIOR REPAIR N/A 06/25/2013   Procedure: ANTERIOR (CYSTOCELE) AND POSTERIOR REPAIR (RECTOCELE);  Surgeon: Gus Height, MD;  Location: [facility];  Service: Gynecology;  Laterality: N/A;  . [place]  . TONSILLECTOMY    . TUBAL LIGATION    . VAGINAL HYSTERECTOMY N/A 06/25/2013   Procedure: HYSTERECTOMY VAGINAL;  Surgeon: Gus Height, MD;  Location: [facility];  Service: Gynecology;  Laterality: N/A;    Social History   Socioeconomic History  . Marital status: Married    Spouse name: Not on file  . Number of children: 1  . Years of education: Not on file  . Highest education level: Not on file  Occupational History  . Occupation: Press photographer / Therapist, art  Social Needs  . Financial resource strain: Not on file  . Food insecurity:    Worry: Not on file    Inability: Not on file  . Transportation needs:    Medical: Not on file    Non-medical: Not on file  Tobacco Use  . Smoking status: Never Smoker  . Smokeless tobacco: Never Used  Substance and Sexual Activity  . Alcohol use: Yes    Comment: socially  . Drug use: No  . Sexual activity: Not on file  Lifestyle  . Physical activity:    Days per week: Not on file    Minutes per session: Not on file  . Stress: Not on file  Relationships  . Social  connections:    Talks on phone: Not on file    Gets together: Not on file    Attends religious service: Not on file    Active member of club or organization: Not on file    Attends meetings of clubs or organizations: Not on file    Relationship status: Not on file  . Intimate partner violence:    Fear of current or ex partner: Not on file    Emotionally abused: Not on file    Physically abused: Not on file    Forced sexual activity: Not on file  Other Topics Concern  . Not on file  Social History Narrative   Lives w/ husband      Allergies as of 08/15/2018      Reactions   Codeine Nausea Only   Tape Other (See Comments)   Dermatitis      Medication List        Accurate as of 08/15/18  4:41 PM. Always use your most recent med list.          cetirizine 10 MG tablet Commonly known as:  ZYRTEC Take 10 mg by mouth daily.   hydrochlorothiazide 25 MG tablet Commonly known as:  HYDRODIURIL Take 1 tablet (25 mg total) by mouth daily.   HYDROcodone-acetaminophen 5-325 MG tablet Commonly  known as:  NORCO/VICODIN Take 1 tablet by mouth daily as needed for moderate pain.   levothyroxine 88 MCG tablet Commonly known as:  SYNTHROID, LEVOTHROID Take 1 tablet (88 mcg total) by mouth daily before breakfast.          Objective:   Physical Exam BP 120/74 (BP Location: Left Arm, Patient Position: Sitting, Cuff Size: Small)   Pulse 75   Temp 98.1 F (36.7 C) (Oral)   Resp 16   Ht 5\' 4"  (1.626 m)   Wt 133 lb 12.8 oz (60.7 kg)   SpO2 99%   BMI 22.97 kg/m  General:   Well developed, NAD, see BMI.  HEENT:  Normocephalic . Face symmetric, atraumatic Lungs:  CTA B Normal respiratory effort, no intercostal retractions, no accessory muscle use. Heart: RRR,  no murmur.  No pretibial edema bilaterally  Skin: Not pale. Not jaundice Neurologic:  alert & oriented X3.  Speech normal, gait appropriate for age and unassisted Psych--  Cognition and judgment appear intact.    Cooperative with normal attention span and concentration.  Behavior appropriate. No anxious or depressed appearing.      Assessment & Plan:   Assessment   HTN Hypothyroidism DJD--shoulder, neck,  on hydrocodone H/o Anxiety depression Shingles 12-2015  PLAN Hypothyroidism: Good compliance with generic Synthroid, check a TSH, RF with results HTN: On HCTZ, refill sent, last BMP Sandra, seems well controlled DJD: On hydrocodone as needed, Rx sent, on average take 1 tablet 3 or 4 times a week.  RTC 11/2018

## 2018-08-15 NOTE — Patient Instructions (Addendum)
GO TO THE LAB : Get the blood work     GO TO THE FRONT DESK Schedule your next appointment for a  Physical by 11-2018

## 2018-08-20 LAB — PAIN MGMT, PROFILE 8 W/CONF, U
6 Acetylmorphine: NEGATIVE ng/mL (ref <10)
Alcohol Metabolites: NEGATIVE ng/mL (ref <500)
Amphetamines: NEGATIVE ng/mL (ref <500)
Benzodiazepines: NEGATIVE ng/mL (ref <100)
Buprenorphine, Urine: NEGATIVE ng/mL (ref <5)
Cocaine Metabolite: NEGATIVE ng/mL (ref <150)
Codeine: NEGATIVE ng/mL (ref <50)
Creatinine: 230.1 mg/dL
Hydrocodone: NEGATIVE ng/mL (ref <50)
Hydromorphone: 96 ng/mL — ABNORMAL HIGH (ref <50)
MDMA: NEGATIVE ng/mL (ref <500)
Marijuana Metabolite: NEGATIVE ng/mL (ref <20)
Morphine: NEGATIVE ng/mL (ref <50)
Norhydrocodone: 52 ng/mL — ABNORMAL HIGH (ref <50)
Opiates: POSITIVE ng/mL — AB (ref <100)
Oxidant: NEGATIVE ug/mL (ref <200)
Oxycodone: NEGATIVE ng/mL (ref <100)
pH: 6.53 (ref 4.5–9.0)

## 2018-10-29 ENCOUNTER — Telehealth: Payer: Self-pay | Admitting: Internal Medicine

## 2018-10-29 DIAGNOSIS — M542 Cervicalgia: Secondary | ICD-10-CM

## 2018-10-29 MED ORDER — HYDROCODONE-ACETAMINOPHEN 5-325 MG PO TABS: 1.0000 | ORAL_TABLET | Freq: Every day | ORAL | 0 refills | Status: DC | PRN

## 2018-10-29 NOTE — Telephone Encounter (Signed)
Sent!

## 2018-10-29 NOTE — Telephone Encounter (Signed)
Pt is requesting refill on hydrocodone 5-325mg .   Last OV: 08/15/2018 Last Fill: 08/15/2018 #90 and 0RF UDS: 08/15/2018 Low risk  NCCR printed- Pt has been receiving:   Phentermine 37.5mg  #90 capsules by Windell Hummingbird, PA-C regularly- last received on 10/15/2018.   No other discrepancies noted- sent for scanning.

## 2019-01-28 ENCOUNTER — Encounter: Payer: Self-pay | Admitting: Internal Medicine

## 2019-01-28 ENCOUNTER — Ambulatory Visit (INDEPENDENT_AMBULATORY_CARE_PROVIDER_SITE_OTHER): Payer: BLUE CROSS/BLUE SHIELD | Admitting: Internal Medicine

## 2019-01-28 VITALS — BP 132/70 | HR 76 | Temp 98.0°F | Resp 16 | Ht 64.0 in | Wt 138.5 lb

## 2019-01-28 DIAGNOSIS — M542 Cervicalgia: Secondary | ICD-10-CM | POA: Diagnosis not present

## 2019-01-28 DIAGNOSIS — Z0000 Encounter for general adult medical examination without abnormal findings: Secondary | ICD-10-CM | POA: Diagnosis not present

## 2019-01-28 DIAGNOSIS — E039 Hypothyroidism, unspecified: Secondary | ICD-10-CM

## 2019-01-28 DIAGNOSIS — Z1231 Encounter for screening mammogram for malignant neoplasm of breast: Secondary | ICD-10-CM

## 2019-01-28 DIAGNOSIS — Z01419 Encounter for gynecological examination (general) (routine) without abnormal findings: Secondary | ICD-10-CM

## 2019-01-28 LAB — CBC WITH DIFFERENTIAL/PLATELET
Basophils Absolute: 0.1 10*3/uL (ref 0.0–0.1)
Basophils Relative: 1.8 % (ref 0.0–3.0)
Eosinophils Absolute: 0.2 10*3/uL (ref 0.0–0.7)
Eosinophils Relative: 4.3 % (ref 0.0–5.0)
HCT: 40.2 % (ref 36.0–46.0)
Hemoglobin: 13.7 g/dL (ref 12.0–15.0)
Lymphocytes Relative: 45.3 % (ref 12.0–46.0)
Lymphs Abs: 1.7 10*3/uL (ref 0.7–4.0)
MCHC: 34 g/dL (ref 30.0–36.0)
MCV: 94.4 fl (ref 78.0–100.0)
Monocytes Absolute: 0.3 10*3/uL (ref 0.1–1.0)
Monocytes Relative: 7.6 % (ref 3.0–12.0)
Neutro Abs: 1.5 10*3/uL (ref 1.4–7.7)
Neutrophils Relative %: 41 % — ABNORMAL LOW (ref 43.0–77.0)
Platelets: 222 10*3/uL (ref 150.0–400.0)
RBC: 4.25 Mil/uL (ref 3.87–5.11)
RDW: 13.3 % (ref 11.5–15.5)
WBC: 3.7 10*3/uL — ABNORMAL LOW (ref 4.0–10.5)

## 2019-01-28 LAB — HEMOGLOBIN A1C: Hgb A1c MFr Bld: 5.2 % (ref 4.6–6.5)

## 2019-01-28 LAB — COMPREHENSIVE METABOLIC PANEL
ALT: 11 U/L (ref 0–35)
AST: 16 U/L (ref 0–37)
Albumin: 4.3 g/dL (ref 3.5–5.2)
Alkaline Phosphatase: 52 U/L (ref 39–117)
BUN: 22 mg/dL (ref 6–23)
CO2: 31 mEq/L (ref 19–32)
Calcium: 9.7 mg/dL (ref 8.4–10.5)
Chloride: 106 mEq/L (ref 96–112)
Creatinine, Ser: 0.74 mg/dL (ref 0.40–1.20)
GFR: 78.44 mL/min (ref >60.00)
Glucose, Bld: 93 mg/dL (ref 70–99)
Potassium: 5.2 mEq/L — ABNORMAL HIGH (ref 3.5–5.1)
Sodium: 144 mEq/L (ref 135–145)
Total Bilirubin: 0.7 mg/dL (ref 0.2–1.2)
Total Protein: 6.3 g/dL (ref 6.0–8.3)

## 2019-01-28 LAB — LIPID PANEL
Cholesterol: 199 mg/dL (ref 0–200)
HDL: 64.6 mg/dL (ref >39.00)
LDL Cholesterol: 118 mg/dL — ABNORMAL HIGH (ref 0–99)
NonHDL: 134.06
Total CHOL/HDL Ratio: 3
Triglycerides: 79 mg/dL (ref 0.0–149.0)
VLDL: 15.8 mg/dL (ref 0.0–40.0)

## 2019-01-28 LAB — TSH: TSH: 0.79 u[IU]/mL (ref 0.35–4.50)

## 2019-01-28 MED ORDER — HYDROCODONE-ACETAMINOPHEN 5-325 MG PO TABS: 1.0000 | ORAL_TABLET | Freq: Every day | ORAL | 0 refills | Status: DC | PRN

## 2019-01-28 MED ORDER — AZELASTINE HCL 0.1 % NA SOLN: 2.0000 | Freq: Every evening | NASAL | 3 refills | Status: DC | PRN

## 2019-01-28 MED ORDER — AMOXICILLIN 500 MG PO CAPS: 1000.0000 mg | ORAL_CAPSULE | Freq: Two times a day (BID) | ORAL | 0 refills | Status: DC

## 2019-01-28 NOTE — Patient Instructions (Addendum)
GO TO THE LAB : Get the blood work     GO TO THE FRONT DESK Schedule your next appointment   A check up in 6 months    If  cough:  Take Mucinex DM twice a day as needed until better  For nasal congestion: Use OTC   Flonase : 2 nasal sprays on each side of the nose in the morning until you feel better Use ASTELIN a prescribed spray : 2 nasal sprays on each side of the nose at night until you feel better    Take the antibiotic as prescribed  (Amoxicillin) in 1 week if no better   Call if not gradually better over the next  2 weekss  Call anytime if the symptoms are severe

## 2019-01-28 NOTE — Assessment & Plan Note (Addendum)
-  Td 2011; pnm shots, shingrix, flu shot : Again declined all today, discussed benefits -CCS: IFOB (-) 2017; 3 modalities discussed again , elected  an IFOB -female care: no recent visits, refer to [facility]; declined referral for a  MMG  - Bone health : no records of a previous DEXA; rec ca and vit D; declined DEXA -Diet exercise discussed -Labs:  CMP, FLP, CBC, TSH  -She is very concerned about cost of care

## 2019-01-28 NOTE — Progress Notes (Signed)
Pre visit review using our clinic review tool, if applicable. No additional management support is needed unless otherwise documented below in the visit note. 

## 2019-01-28 NOTE — Progress Notes (Signed)
Subjective:    Patient ID: Sandra Owen, female    DOB: [birth year], 68 y.o.   MRN: [id number]  DOS:  01/28/2019 Type of visit - description: CPX We also review her chronic medical conditions  Review of Systems For the last 4 weeks she has on and off sinus pain, watery eyes, congestion, malaise. Taking sporadic OTCs. Occasionally feels feverish but did not check her temp. No cough or chest congestion  Other than above, a 14 point review of systems is negative  . Past Medical History:  Diagnosis Date  . Allergic rhinitis   . Anxiety and depression    not currently  . Hypertension   . Hypothyroidism   . PONV (postoperative nausea and vomiting)     Past Surgical History:  Procedure Laterality Date  . ANTERIOR AND POSTERIOR REPAIR N/A 06/25/2013   Procedure: ANTERIOR (CYSTOCELE) AND POSTERIOR REPAIR (RECTOCELE);  Surgeon: Gus Height, MD;  Location: [facility];  Service: Gynecology;  Laterality: N/A;  . [place]  . TONSILLECTOMY    . TUBAL LIGATION    . VAGINAL HYSTERECTOMY N/A 06/25/2013   HYSTERECTOMY VAGINAL;  Surgeon: Gus Height, MD;  Location: [facility];  Service: Gynecology;  Laterality: N/A;    Social History   Socioeconomic History  . Marital status: Married    Spouse name: Not on file  . Number of children: 1  . Years of education: Not on file  . Highest education level: Not on file  Occupational History  . Occupation: Press photographer / Therapist, art  Social Needs  . Financial resource strain: Not on file  . Food insecurity:    Worry: Not on file    Inability: Not on file  . Transportation needs:    Medical: Not on file    Non-medical: Not on file  Tobacco Use  . Smoking status: Never Smoker  . Smokeless tobacco: Never Used  Substance and Sexual Activity  . Alcohol use: Yes    Comment: socially  . Drug use: No  . Sexual activity: Not on file  Lifestyle  . Physical activity:    Days per week: Not on file    Minutes per session: Not on file  . Stress:  Not on file  Relationships  . Social connections:    Talks on phone: Not on file    Gets together: Not on file    Attends religious service: Not on file    Active member of club or organization: Not on file    Attends meetings of clubs or organizations: Not on file    Relationship status: Not on file  . Intimate partner violence:    Fear of current or ex partner: Not on file    Emotionally abused: Not on file    Physically abused: Not on file    Forced sexual activity: Not on file  Other Topics Concern  . Not on file  Social History Narrative   Lives w/ husband     Past Medical History:  Diagnosis Date  . Allergic rhinitis   . Anxiety and depression    not currently  . Hypertension   . Hypothyroidism   . PONV (postoperative nausea and vomiting)     Allergies as of 01/28/2019      Reactions   Codeine Nausea Only   Tape Other (See Comments)   Dermatitis      Medication List       Accurate as of January 28, 2019 11:59 PM. Always use your  most recent med list.        amoxicillin 500 MG capsule Commonly known as:  AMOXIL Take 2 capsules (1,000 mg total) by mouth 2 (two) times daily.   azelastine 0.1 % nasal spray Commonly known as:  ASTELIN Place 2 sprays into both nostrils at bedtime as needed for rhinitis. Use in each nostril as directed   cetirizine 10 MG tablet Commonly known as:  ZYRTEC Take 10 mg by mouth daily.   hydrochlorothiazide 25 MG tablet Commonly known as:  HYDRODIURIL Take 1 tablet (25 mg total) by mouth daily.   HYDROcodone-acetaminophen 5-325 MG tablet Commonly known as:  NORCO/VICODIN Take 1 tablet by mouth daily as needed for moderate pain.   levothyroxine 88 MCG tablet Commonly known as:  SYNTHROID, LEVOTHROID Take 1 tablet (88 mcg total) by mouth daily before breakfast.           Objective:   Physical Exam BP 132/70 (BP Location: Left Arm, Patient Position: Sitting, Cuff Size: Small)   Pulse 76   Temp 98 F (36.7 C) (Oral)    Resp 16   Ht 5\' 4"  (1.626 m)   Wt 138 lb 8 oz (62.8 kg)   SpO2 98%   BMI 23.77 kg/m  General: Well developed, NAD, BMI noted Neck: No  thyromegaly  HEENT:  Normocephalic . Face symmetric, atraumatic.  Left TM normal, right TM obscured by wax. Nose congested, left maxillary sinuses slightly TTP.  Throat symmetric, no red Lungs:  CTA B Normal respiratory effort, no intercostal retractions, no accessory muscle use. Heart: RRR,  no murmur.  No pretibial edema bilaterally  Abdomen:  Not distended, soft, non-tender. No rebound or rigidity.  Palpable aorta without bruits or pain Skin: Exposed areas without rash. Not pale. Not jaundice Neurologic:  alert & oriented X3.  Speech normal, gait appropriate for age and unassisted Strength symmetric and appropriate for age.  Psych: Cognition and judgment appear intact.  Cooperative with normal attention span and concentration.  Behavior appropriate. No anxious or depressed appearing.     Assessment    Assessment   HTN Hypothyroidism DJD--shoulder, neck,  on hydrocodone H/o Anxiety depression Shingles 12-2015  PLAN ZHY:[id number] labs, continue hydrochlorothiazide. Hypothyroidism: Continue Synthroid, checking labs DJD: RF hydrocodone as needed. Sinusitis: Symptoms suggest sinusitis, recommend consistent use of Astelin, Flonase.  If not better start amoxicillin.  See AVS. Palpable aorta without a bruit or pain.  Monitor clinically, consider ultrasound. RTC 6 months

## 2019-01-29 NOTE — Assessment & Plan Note (Signed)
AST:[id number] labs, continue hydrochlorothiazide. Hypothyroidism: Continue Synthroid, checking labs DJD: RF hydrocodone as needed. Sinusitis: Symptoms suggest sinusitis, recommend consistent use of Astelin, Flonase.  If not better start amoxicillin.  See AVS. Palpable aorta without a bruit or pain.  Monitor clinically, consider ultrasound. RTC 6 months

## 2019-02-01 ENCOUNTER — Encounter: Payer: Self-pay | Admitting: Internal Medicine

## 2019-02-11 ENCOUNTER — Other Ambulatory Visit: Payer: Self-pay | Admitting: Internal Medicine

## 2019-02-15 ENCOUNTER — Other Ambulatory Visit (INDEPENDENT_AMBULATORY_CARE_PROVIDER_SITE_OTHER): Payer: BLUE CROSS/BLUE SHIELD

## 2019-02-15 DIAGNOSIS — Z0000 Encounter for general adult medical examination without abnormal findings: Secondary | ICD-10-CM | POA: Diagnosis not present

## 2019-02-15 LAB — FECAL OCCULT BLOOD, IMMUNOCHEMICAL: Fecal Occult Bld: NEGATIVE

## 2019-05-14 ENCOUNTER — Telehealth: Payer: Self-pay | Admitting: Internal Medicine

## 2019-05-14 DIAGNOSIS — M542 Cervicalgia: Secondary | ICD-10-CM

## 2019-05-14 MED ORDER — HYDROCODONE-ACETAMINOPHEN 5-325 MG PO TABS: 1.0000 | ORAL_TABLET | Freq: Every day | ORAL | 0 refills | Status: DC | PRN

## 2019-05-14 NOTE — Telephone Encounter (Signed)
Hydrocodone refill.   Last OV: 01/28/2019 Last Fill: 01/28/2019 #90 and 0RF Pt sig: 1 tab daily prn UDS: 08/15/2018 Low risk

## 2019-05-14 NOTE — Telephone Encounter (Signed)
Rx printed by mistake, discarded.  Electronic prescription sent

## 2019-05-22 ENCOUNTER — Telehealth: Payer: Self-pay | Admitting: Internal Medicine

## 2019-05-22 ENCOUNTER — Other Ambulatory Visit: Payer: Self-pay

## 2019-05-22 MED ORDER — HYDROCHLOROTHIAZIDE 25 MG PO TABS: 25.0000 mg | ORAL_TABLET | Freq: Every day | ORAL | 0 refills | Status: DC

## 2019-05-22 NOTE — Telephone Encounter (Signed)
Refill sent.

## 2019-05-22 NOTE — Telephone Encounter (Signed)
Copied from [place] [id number] [id number]. Topic: Quick Communication - Rx Refill/Question >> May 22, 2019 11:11 AM Reyne Dumas L wrote: Medication: hydrochlorothiazide (HYDRODIURIL) 25 MG tablet  Has the patient contacted their pharmacy? Yes - they state they have no response from [place] (Agent: If no, request that the patient contact the pharmacy for the refill.) (Agent: If yes, when and what did the pharmacy advise?)  Preferred Pharmacy (with phone number or street name): [facility], [place] to [facility] [phone] (Phone) [phone] (Fax)  Agent: Please be advised that RX refills may take up to 3 business days. We ask that you follow-up with your pharmacy.

## 2019-05-24 ENCOUNTER — Other Ambulatory Visit: Payer: Self-pay

## 2019-05-24 MED ORDER — HYDROCHLOROTHIAZIDE 25 MG PO TABS: 25.0000 mg | ORAL_TABLET | Freq: Every day | ORAL | 1 refills | Status: DC

## 2019-07-29 ENCOUNTER — Telehealth: Payer: Self-pay | Admitting: Internal Medicine

## 2019-07-29 MED ORDER — LEVOTHYROXINE SODIUM 88 MCG PO TABS: 88.0000 ug | ORAL_TABLET | Freq: Every day | ORAL | 0 refills | Status: DC

## 2019-07-29 MED ORDER — HYDROCHLOROTHIAZIDE 25 MG PO TABS: 25.0000 mg | ORAL_TABLET | Freq: Every day | ORAL | 0 refills | Status: DC

## 2019-07-29 NOTE — Telephone Encounter (Signed)
Rx for HCTZ and levothyroxine sent to [facility] mail order. Hydrocodone to be done at time of visit.

## 2019-07-29 NOTE — Telephone Encounter (Signed)
Pt came by office today to update her insurance information to [facility] and to request refills on Levothyroxine 39mCG 90 count and Hydochlorothiazide 25mg  90 form [facility].  Pt stated she has already set up her information for the online portal.   Pt also is requesting a refill on Hydrocodone 5-325mg  90 count from [place] because she is getting it through [facility] for a better rate.  I did let pt know she is due for a 6 mo f/u visit from her CPE in Feb.  I scheduled her for 8/10 for that visit and did let her know she will more than likely need to be seen before this request is approved.

## 2019-07-31 ENCOUNTER — Other Ambulatory Visit: Payer: Self-pay

## 2019-07-31 ENCOUNTER — Ambulatory Visit (INDEPENDENT_AMBULATORY_CARE_PROVIDER_SITE_OTHER): Payer: Medicare Other | Admitting: Internal Medicine

## 2019-07-31 ENCOUNTER — Encounter: Payer: Self-pay | Admitting: Internal Medicine

## 2019-07-31 ENCOUNTER — Telehealth: Payer: Self-pay

## 2019-07-31 VITALS — BP 132/75 | HR 74 | Temp 97.9°F | Resp 16 | Ht 64.0 in | Wt 137.4 lb

## 2019-07-31 DIAGNOSIS — I10 Essential (primary) hypertension: Secondary | ICD-10-CM

## 2019-07-31 DIAGNOSIS — M8949 Other hypertrophic osteoarthropathy, multiple sites: Secondary | ICD-10-CM

## 2019-07-31 DIAGNOSIS — M542 Cervicalgia: Secondary | ICD-10-CM | POA: Diagnosis not present

## 2019-07-31 DIAGNOSIS — M159 Polyosteoarthritis, unspecified: Secondary | ICD-10-CM

## 2019-07-31 DIAGNOSIS — M150 Primary generalized (osteo)arthritis: Secondary | ICD-10-CM | POA: Diagnosis not present

## 2019-07-31 DIAGNOSIS — Z79899 Other long term (current) drug therapy: Secondary | ICD-10-CM | POA: Diagnosis not present

## 2019-07-31 MED ORDER — HYDROCODONE-ACETAMINOPHEN 5-325 MG PO TABS: 1.0000 | ORAL_TABLET | Freq: Every day | ORAL | 0 refills | Status: DC | PRN

## 2019-07-31 NOTE — Progress Notes (Signed)
Pre visit review using our clinic review tool, if applicable. No additional management support is needed unless otherwise documented below in the visit note. 

## 2019-07-31 NOTE — Progress Notes (Signed)
Subjective:    Patient ID: Sandra Owen, female    DOB: [birth year], 68 y.o.   MRN: [id number]  DOS:  07/31/2019 Type of visit - description: Routine visit Pain management: 1 hydrocodone daily controls pain sufficiently. Hypertension: Good compliance with medication, ambulatory BP is really good. COVID-19: Good precautions  Review of Systems No fever chills No chest pain no difficulty breathing   Past Medical History:  Diagnosis Date  . Allergic rhinitis   . Anxiety and depression    not currently  . Hypertension   . Hypothyroidism   . PONV (postoperative nausea and vomiting)     Past Surgical History:  Procedure Laterality Date  . ANTERIOR AND POSTERIOR REPAIR N/A 06/25/2013   Procedure: ANTERIOR (CYSTOCELE) AND POSTERIOR REPAIR (RECTOCELE);  Surgeon: Gus Height, MD;  Location: [facility];  Service: Gynecology;  Laterality: N/A;  . [place]  . TONSILLECTOMY    . TUBAL LIGATION    . VAGINAL HYSTERECTOMY N/A 06/25/2013   HYSTERECTOMY VAGINAL;  Surgeon: Gus Height, MD;  Location: [facility];  Service: Gynecology;  Laterality: N/A;    Social History   Socioeconomic History  . Marital status: Married    Spouse name: Not on file  . Number of children: 1  . Years of education: Not on file  . Highest education level: Not on file  Occupational History  . Occupation: Press photographer / Therapist, art  Social Needs  . Financial resource strain: Not on file  . Food insecurity    Worry: Not on file    Inability: Not on file  . Transportation needs    Medical: Not on file    Non-medical: Not on file  Tobacco Use  . Smoking status: Never Smoker  . Smokeless tobacco: Never Used  Substance and Sexual Activity  . Alcohol use: Yes    Comment: socially  . Drug use: No  . Sexual activity: Not on file  Lifestyle  . Physical activity    Days per week: Not on file    Minutes per session: Not on file  . Stress: Not on file  Relationships  . Social Herbalist on phone:  Not on file    Gets together: Not on file    Attends religious service: Not on file    Active member of club or organization: Not on file    Attends meetings of clubs or organizations: Not on file    Relationship status: Not on file  . Intimate partner violence    Fear of current or ex partner: Not on file    Emotionally abused: Not on file    Physically abused: Not on file    Forced sexual activity: Not on file  Other Topics Concern  . Not on file  Social History Narrative   Lives w/ husband      Allergies as of 07/31/2019      Reactions   Codeine Nausea Only   Tape Other (See Comments)   Dermatitis      Medication List       Accurate as of July 31, 2019 10:13 AM. If you have any questions, ask your nurse or doctor.        amoxicillin 500 MG capsule Commonly known as: AMOXIL Take 2 capsules (1,000 mg total) by mouth 2 (two) times daily.   azelastine 0.1 % nasal spray Commonly known as: ASTELIN Place 2 sprays into both nostrils at bedtime as needed for rhinitis. Use in each  nostril as directed   cetirizine 10 MG tablet Commonly known as: ZYRTEC Take 10 mg by mouth daily.   hydrochlorothiazide 25 MG tablet Commonly known as: HYDRODIURIL Take 1 tablet (25 mg total) by mouth daily.   HYDROcodone-acetaminophen 5-325 MG tablet Commonly known as: NORCO/VICODIN Take 1 tablet by mouth daily as needed for moderate pain.   levothyroxine 88 MCG tablet Commonly known as: SYNTHROID Take 1 tablet (88 mcg total) by mouth daily before breakfast.           Objective:   Physical Exam BP 132/75 (BP Location: Left Arm, Patient Position: Sitting, Cuff Size: Small)   Pulse 74   Temp 97.9 F (36.6 C) (Oral)   Resp 16   Ht 5\' 4"  (1.626 m)   Wt 137 lb 6 oz (62.3 kg)   SpO2 100%   BMI 23.58 kg/m  General:   Well developed, NAD, BMI noted. HEENT:  Normocephalic . Face symmetric, atraumatic Lungs:  CTA B Normal respiratory effort, no intercostal retractions, no  accessory muscle use. Heart: RRR,  no murmur.  No pretibial edema bilaterally  Skin: Not pale. Not jaundice Neurologic:  alert & oriented X3.  Speech normal, gait appropriate for age and unassisted Psych--  Cognition and judgment appear intact.  Cooperative with normal attention span and concentration.  Behavior appropriate. No anxious or depressed appearing.      Assessment     Assessment   HTN Hypothyroidism DJD--shoulder, neck,  on hydrocodone; h/o intolerance to NSAIDs (GI s/e)  H/o Anxiety depression Shingles 12-2015  PLAN HTN: Continue with HCTZ, last potassium was minimally elevated, rather than re- check labs we agreed to avoid excessive potassium in her diet.  Information provided Hypothyroidism: Continue Synthroid DJD: Continue hydrocodone daily, we discussed possibly adding NSAIDs to decrease the need for narcotics but declined it, history of previous side effects.  RF meds, check a UDS Preventive care: Encourage a flu shot, patient states is very likely she want to proceed.  Benefits discussed. RTC 6 months CPX

## 2019-07-31 NOTE — Telephone Encounter (Signed)
Sandra Owen from [facility] called to make Sandra Owen aware that Pt is trying to refill Hydrocodone about 2 weeks early- last filled on 05/14/2019 90 day supply- Pt is going out of town on Saturday- okay per Sandra Owen to refill now.

## 2019-07-31 NOTE — Patient Instructions (Signed)
GO TO THE LAB : Provide a urine sample   GO TO THE FRONT DESK Schedule your next appointment for a physical exam in 6 months

## 2019-08-01 NOTE — Assessment & Plan Note (Signed)
HTN: Continue with HCTZ, last potassium was minimally elevated, rather than re- check labs we agreed to avoid excessive potassium in her diet.  Information provided Hypothyroidism: Continue Synthroid DJD: Continue hydrocodone daily, we discussed possibly adding NSAIDs to decrease the need for narcotics but declined it, history of previous side effects.  RF meds, check a UDS Preventive care: Encourage a flu shot, patient states is very likely she want to proceed.  Benefits discussed. RTC 6 months CPX

## 2019-08-02 LAB — PAIN MGMT, PROFILE 8 W/CONF, U
6 Acetylmorphine: NEGATIVE ng/mL
Alcohol Metabolites: NEGATIVE ng/mL (ref <500)
Amphetamines: NEGATIVE ng/mL
Benzodiazepines: NEGATIVE ng/mL
Buprenorphine, Urine: NEGATIVE ng/mL
Cocaine Metabolite: NEGATIVE ng/mL
Codeine: NEGATIVE ng/mL
Creatinine: >300 mg/dL
Hydrocodone: NEGATIVE ng/mL
Hydromorphone: NEGATIVE ng/mL
MDMA: NEGATIVE ng/mL
Marijuana Metabolite: NEGATIVE ng/mL
Morphine: NEGATIVE ng/mL
Norhydrocodone: NEGATIVE ng/mL
Opiates: NEGATIVE ng/mL
Oxidant: NEGATIVE ug/mL
Oxycodone: NEGATIVE ng/mL
pH: 8.2 (ref 4.5–9.0)

## 2019-08-04 ENCOUNTER — Other Ambulatory Visit: Payer: Self-pay | Admitting: Internal Medicine

## 2019-08-15 ENCOUNTER — Other Ambulatory Visit: Payer: Self-pay

## 2019-08-16 ENCOUNTER — Ambulatory Visit (HOSPITAL_BASED_OUTPATIENT_CLINIC_OR_DEPARTMENT_OTHER)
Admission: RE | Admit: 2019-08-16 | Discharge: 2019-08-16 | Disposition: A | Discharge status: Home / Self Care | Payer: Medicare Other | Source: Ambulatory Visit | Attending: Internal Medicine | Admitting: Internal Medicine

## 2019-08-16 ENCOUNTER — Ambulatory Visit (INDEPENDENT_AMBULATORY_CARE_PROVIDER_SITE_OTHER): Payer: Medicare Other | Admitting: Internal Medicine

## 2019-08-16 ENCOUNTER — Encounter: Payer: Self-pay | Admitting: Internal Medicine

## 2019-08-16 VITALS — BP 155/69 | HR 66 | Temp 98.0°F | Resp 16 | Ht 64.0 in | Wt 143.4 lb

## 2019-08-16 DIAGNOSIS — M79672 Pain in left foot: Secondary | ICD-10-CM | POA: Insufficient documentation

## 2019-08-16 DIAGNOSIS — R399 Unspecified symptoms and signs involving the genitourinary system: Secondary | ICD-10-CM | POA: Diagnosis not present

## 2019-08-16 DIAGNOSIS — I10 Essential (primary) hypertension: Secondary | ICD-10-CM | POA: Diagnosis not present

## 2019-08-16 LAB — URINALYSIS, ROUTINE W REFLEX MICROSCOPIC
Bilirubin Urine: NEGATIVE
Hgb urine dipstick: NEGATIVE
Ketones, ur: NEGATIVE
Leukocytes,Ua: NEGATIVE
Nitrite: NEGATIVE
RBC / HPF: NONE SEEN (ref 0–?)
Specific Gravity, Urine: 1.015 (ref 1.000–1.030)
Total Protein, Urine: NEGATIVE
Urine Glucose: NEGATIVE
Urobilinogen, UA: 0.2 (ref 0.0–1.0)
WBC, UA: NONE SEEN (ref 0–?)
pH: 7 (ref 5.0–8.0)

## 2019-08-16 NOTE — Progress Notes (Signed)
Pre visit review using our clinic review tool, if applicable. No additional management support is needed unless otherwise documented below in the visit note. 

## 2019-08-16 NOTE — Progress Notes (Signed)
Subjective:    Patient ID: Sandra Owen, female    DOB: [birth year], 68 y.o.   MRN: [id number]  DOS:  08/16/2019 Type of visit - description: Acute Approximately 10 days ago the patient noted swelling at the left foot: Started at the dorsum of the foot and by the end of the day it increases to the peri-ankle area. + Pain at the dorsum of the foot by the second third and fourth toe with walking. No injury, no increased walking lately. No redness, no bruising, slightly warm?.  Also, report increase in urinary frequency but no dysuria or gross hematuria.  Review of Systems Denies any recent airplane or prolonged car trip No calf pain per se No fever chills No chest pain no difficulty breathing No blurred vision  Past Medical History:  Diagnosis Date  . Allergic rhinitis   . Anxiety and depression    not currently  . Hypertension   . Hypothyroidism   . PONV (postoperative nausea and vomiting)     Past Surgical History:  Procedure Laterality Date  . ANTERIOR AND POSTERIOR REPAIR N/A 06/25/2013   Procedure: ANTERIOR (CYSTOCELE) AND POSTERIOR REPAIR (RECTOCELE);  Surgeon: Gus Height, MD;  Location: [facility];  Service: Gynecology;  Laterality: N/A;  . [place]  . TONSILLECTOMY    . TUBAL LIGATION    . VAGINAL HYSTERECTOMY N/A 06/25/2013   HYSTERECTOMY VAGINAL;  Surgeon: Gus Height, MD;  Location: [facility];  Service: Gynecology;  Laterality: N/A;    Social History   Socioeconomic History  . Marital status: Married    Spouse name: Not on file  . Number of children: 1  . Years of education: Not on file  . Highest education level: Not on file  Occupational History  . 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 Financial resource strain: Not on file  . Food insecurity    Worry: Not on file    Inability: Not on file  . Transportation needs    Medical: Not on file    Non-medical: Not on file  Tobacco Use  . Smoking status: Never Smoker  .  Smokeless tobacco: Never Used  Substance and Sexual Activity  . Alcohol use: Yes    Comment: socially  . Drug use: No  . Sexual activity: Not on file  Lifestyle  . Physical activity    Days per week: Not on file    Minutes per session: Not on file  . Stress: Not on file  Relationships  . Social Herbalist on phone: Not on file    Gets together: Not on file    Attends religious service: Not on file    Active member of club or organization: Not on file    Attends meetings of clubs or organizations: Not on file    Relationship status: Not on file  . Intimate partner violence    Fear of current or ex partner: Not on file    Emotionally abused: Not on file    Physically abused: Not on file    Forced sexual activity: Not on file  Other Topics Concern  . Not on file  Social History Narrative   Lives w/ husband      Allergies as of 08/16/2019      Reactions   Codeine Nausea Only   Tape Other (See Comments)   Dermatitis      Medication List       Accurate as  of August 16, 2019 10:54 AM. If you have any questions, ask your nurse or doctor.        azelastine 0.1 % nasal spray Commonly known as: ASTELIN Place 2 sprays into both nostrils at bedtime as needed for rhinitis. Use in each nostril as directed   cetirizine 10 MG tablet Commonly known as: ZYRTEC Take 10 mg by mouth daily.   hydrochlorothiazide 25 MG tablet Commonly known as: HYDRODIURIL Take 1 tablet (25 mg total) by mouth daily.   HYDROcodone-acetaminophen 5-325 MG tablet Commonly known as: NORCO/VICODIN Take 1 tablet by mouth daily as needed for moderate pain.   levothyroxine 88 MCG tablet Commonly known as: SYNTHROID Take 1 tablet (88 mcg total) by mouth daily before breakfast.           Objective:   Physical Exam BP (!) 155/69 (BP Location: Left Arm, Patient Position: Sitting, Cuff Size: Small)   Pulse 66   Temp 98 F (36.7 C) (Temporal)   Resp 16   Ht 5\' 4"  (1.626 m)   Wt 143 lb  6 oz (65 kg)   SpO2 100%   BMI 24.61 kg/m  General:   Well developed, NAD, BMI noted. HEENT:  Normocephalic . Face symmetric, atraumatic Lungs:  CTA B Normal respiratory effort, no intercostal retractions, no accessory muscle use. Heart: RRR,  no murmur.  Abdomen: Soft, no suprapubic tenderness Lower extremities: Right leg normal Left leg: Calf soft, not tender,  symmetric compared to the right Good pedal pulse + Swelling at the dorsum of the foot. Ankle is full range of motion and not tender. + TTP at the dorsum by the second and third toe. Skin: Not pale. Not jaundice Neurologic:  alert & oriented X3.  Speech normal, gait appropriate for age and unassisted Psych--  Cognition and judgment appear intact.  Cooperative with normal attention span and concentration.  Behavior appropriate. No anxious or depressed appearing.      Assessment     Assessment   HTN Hypothyroidism DJD--shoulder, neck,  on hydrocodone; h/o intolerance to NSAIDs (GI s/e)  H/o Anxiety depression Shingles 12-2015  PLAN Left foot pain and swelling: Suspect MSK issue, possibly stress fracture. We will get an x-ray Otherwise Tylenol, rest, ice.  Call if not better. Low suspicious for gout or a DVT. L UTS: As described above, check a UA urine culture HTN: Reports ambulatory BPs at home are normal.

## 2019-08-16 NOTE — Patient Instructions (Signed)
GO TO THE LAB : Provide a urine sample   STOP BY THE FIRST FLOOR:  get the XR   Ice the foot twice a day Tylenol as needed Leg elevation Call if not better   Continue checking your blood pressure

## 2019-08-17 LAB — URINE CULTURE
MICRO NUMBER:: 823445
Result:: NO GROWTH
SPECIMEN QUALITY:: ADEQUATE

## 2019-08-17 NOTE — Assessment & Plan Note (Signed)
Left foot pain and swelling: Suspect MSK issue, possibly stress fracture. We will get an x-ray Otherwise Tylenol, rest, ice.  Call if not better. Low suspicious for gout or a DVT. L UTS: As described above, check a UA urine culture HTN: Reports ambulatory BPs at home are normal.

## 2019-10-29 ENCOUNTER — Other Ambulatory Visit: Payer: Self-pay | Admitting: Internal Medicine

## 2019-10-29 MED ORDER — LEVOTHYROXINE SODIUM 88 MCG PO TABS: 88.0000 ug | ORAL_TABLET | Freq: Every day | ORAL | 1 refills | Status: DC

## 2019-10-29 MED ORDER — HYDROCHLOROTHIAZIDE 25 MG PO TABS: 25.0000 mg | ORAL_TABLET | Freq: Every day | ORAL | 0 refills | Status: DC

## 2019-10-29 NOTE — Telephone Encounter (Signed)
Medication Refill - Medication: levothyroxine (SYNTHROID) 88 MCG tablet [id number]  hydrochlorothiazide (HYDRODIURIL) 25 MG tablet [id number]    This is a repeat of the last order.  Preferred Pharmacy (with phone number or street name):   [facility] - [address], [place] [address]  Phone: [phone] Fax: [phone]        HYDROcodone-acetaminophen (NORCO/VICODIN) 5-325 MG tablet [id number]   This is a 90 day supply for single care prescripton   Preferred Pharmacy (with phone number or street name):  [facility] [address], [place] - [place]  [place] [address]  Phone: [phone] Fax: [phone]          Agent: Please be advised that RX refills may take up to 3 business days. We ask that you follow-up with your pharmacy.

## 2019-10-31 ENCOUNTER — Other Ambulatory Visit: Payer: Self-pay | Admitting: Internal Medicine

## 2019-10-31 DIAGNOSIS — M542 Cervicalgia: Secondary | ICD-10-CM

## 2019-11-01 MED ORDER — HYDROCODONE-ACETAMINOPHEN 5-325 MG PO TABS: 1.0000 | ORAL_TABLET | Freq: Every day | ORAL | 0 refills | Status: DC | PRN

## 2019-11-01 NOTE — Telephone Encounter (Signed)
Last written: 07/31/19 Last ov: 08/16/19 Next ov: nothing scheduled Contract: due UDS: due

## 2019-11-01 NOTE — Telephone Encounter (Signed)
Next OV due ~ 01/2020. I notice that she takes phentermine sometimes Will refill medications for now

## 2020-01-30 ENCOUNTER — Telehealth: Payer: Self-pay | Admitting: Internal Medicine

## 2020-01-30 DIAGNOSIS — M542 Cervicalgia: Secondary | ICD-10-CM

## 2020-01-30 MED ORDER — HYDROCODONE-ACETAMINOPHEN 5-325 MG PO TABS: 1.0000 | ORAL_TABLET | Freq: Every day | ORAL | 0 refills | Status: DC | PRN

## 2020-01-30 NOTE — Telephone Encounter (Signed)
PDMP okay, RF sent 

## 2020-01-30 NOTE — Telephone Encounter (Signed)
Hydrocodone refill.   Last OV: 07/31/2019 Last Fill: 11/01/2019 #90 AND 0rf Pt sig: 1 tab daily prn UDS: 07/31/2019 Low risk

## 2020-02-04 ENCOUNTER — Other Ambulatory Visit: Payer: Self-pay

## 2020-02-04 MED ORDER — HYDROCHLOROTHIAZIDE 25 MG PO TABS: 25.0000 mg | ORAL_TABLET | Freq: Every day | ORAL | 0 refills | Status: DC

## 2020-02-20 DIAGNOSIS — Z760 Encounter for issue of repeat prescription: Secondary | ICD-10-CM | POA: Diagnosis not present

## 2020-02-20 DIAGNOSIS — Z6826 Body mass index (BMI) 26.0-26.9, adult: Secondary | ICD-10-CM | POA: Diagnosis not present

## 2020-02-20 DIAGNOSIS — R635 Abnormal weight gain: Secondary | ICD-10-CM | POA: Diagnosis not present

## 2020-04-27 ENCOUNTER — Other Ambulatory Visit: Payer: Self-pay | Admitting: Internal Medicine

## 2020-04-27 DIAGNOSIS — M542 Cervicalgia: Secondary | ICD-10-CM

## 2020-04-27 MED ORDER — HYDROCODONE-ACETAMINOPHEN 5-325 MG PO TABS: 1.0000 | ORAL_TABLET | Freq: Every day | ORAL | 0 refills | Status: DC | PRN

## 2020-04-27 NOTE — Telephone Encounter (Signed)
PDMP reviewed, she takes phentermine regularly prescribed elsewhere. Will refill hydrocodone today. Due for a CPX, please arrange

## 2020-04-27 NOTE — Telephone Encounter (Signed)
Hydrocodone refill.   Last OV: 08/16/2019, no future appt scheduled Last Fill: 01/30/2020 #90 AND 0RF Pt sig: 1 tab daily prn UDS: 07/31/2019 Moderate risk

## 2020-04-28 NOTE — Telephone Encounter (Signed)
Called pt she was at work but stated she would call back to get an appt.when she had more time

## 2020-04-30 DIAGNOSIS — H524 Presbyopia: Secondary | ICD-10-CM | POA: Diagnosis not present

## 2020-04-30 DIAGNOSIS — H1032 Unspecified acute conjunctivitis, left eye: Secondary | ICD-10-CM | POA: Diagnosis not present

## 2020-04-30 DIAGNOSIS — H5213 Myopia, bilateral: Secondary | ICD-10-CM | POA: Diagnosis not present

## 2020-04-30 DIAGNOSIS — H52221 Regular astigmatism, right eye: Secondary | ICD-10-CM | POA: Diagnosis not present

## 2020-05-13 ENCOUNTER — Encounter: Payer: Self-pay | Admitting: Internal Medicine

## 2020-05-13 ENCOUNTER — Ambulatory Visit (INDEPENDENT_AMBULATORY_CARE_PROVIDER_SITE_OTHER): Payer: Medicare Other | Admitting: Internal Medicine

## 2020-05-13 ENCOUNTER — Other Ambulatory Visit: Payer: Self-pay

## 2020-05-13 VITALS — BP 150/81 | HR 70 | Temp 97.3°F | Resp 18 | Ht 64.0 in | Wt 129.4 lb

## 2020-05-13 DIAGNOSIS — M8949 Other hypertrophic osteoarthropathy, multiple sites: Secondary | ICD-10-CM

## 2020-05-13 DIAGNOSIS — Z79899 Other long term (current) drug therapy: Secondary | ICD-10-CM

## 2020-05-13 DIAGNOSIS — E039 Hypothyroidism, unspecified: Secondary | ICD-10-CM

## 2020-05-13 DIAGNOSIS — M159 Polyosteoarthritis, unspecified: Secondary | ICD-10-CM

## 2020-05-13 DIAGNOSIS — Z0000 Encounter for general adult medical examination without abnormal findings: Secondary | ICD-10-CM | POA: Diagnosis not present

## 2020-05-13 LAB — CBC WITH DIFFERENTIAL/PLATELET
Basophils Absolute: 0.1 10*3/uL (ref 0.0–0.1)
Basophils Relative: 1.4 % (ref 0.0–3.0)
Eosinophils Absolute: 0.2 10*3/uL (ref 0.0–0.7)
Eosinophils Relative: 5.1 % — ABNORMAL HIGH (ref 0.0–5.0)
HCT: 41.9 % (ref 36.0–46.0)
Hemoglobin: 14.3 g/dL (ref 12.0–15.0)
Lymphocytes Relative: 38.6 % (ref 12.0–46.0)
Lymphs Abs: 1.6 10*3/uL (ref 0.7–4.0)
MCHC: 34 g/dL (ref 30.0–36.0)
MCV: 95.3 fl (ref 78.0–100.0)
Monocytes Absolute: 0.4 10*3/uL (ref 0.1–1.0)
Monocytes Relative: 10.3 % (ref 3.0–12.0)
Neutro Abs: 1.9 10*3/uL (ref 1.4–7.7)
Neutrophils Relative %: 44.6 % (ref 43.0–77.0)
Platelets: 206 10*3/uL (ref 150.0–400.0)
RBC: 4.4 Mil/uL (ref 3.87–5.11)
RDW: 13.1 % (ref 11.5–15.5)
WBC: 4.3 10*3/uL (ref 4.0–10.5)

## 2020-05-13 LAB — LIPID PANEL
Cholesterol: 229 mg/dL — ABNORMAL HIGH (ref 0–200)
HDL: 74.4 mg/dL (ref >39.00)
LDL Cholesterol: 136 mg/dL — ABNORMAL HIGH (ref 0–99)
NonHDL: 154.65
Total CHOL/HDL Ratio: 3
Triglycerides: 94 mg/dL (ref 0.0–149.0)
VLDL: 18.8 mg/dL (ref 0.0–40.0)

## 2020-05-13 LAB — TSH: TSH: 0.67 u[IU]/mL (ref 0.35–4.50)

## 2020-05-13 LAB — COMPREHENSIVE METABOLIC PANEL
ALT: 14 U/L (ref 0–35)
AST: 18 U/L (ref 0–37)
Albumin: 4.7 g/dL (ref 3.5–5.2)
Alkaline Phosphatase: 53 U/L (ref 39–117)
BUN: 28 mg/dL — ABNORMAL HIGH (ref 6–23)
CO2: 30 mEq/L (ref 19–32)
Calcium: 9.9 mg/dL (ref 8.4–10.5)
Chloride: 104 mEq/L (ref 96–112)
Creatinine, Ser: 0.86 mg/dL (ref 0.40–1.20)
GFR: 65.7 mL/min (ref >60.00)
Glucose, Bld: 98 mg/dL (ref 70–99)
Potassium: 4.5 mEq/L (ref 3.5–5.1)
Sodium: 141 mEq/L (ref 135–145)
Total Bilirubin: 0.9 mg/dL (ref 0.2–1.2)
Total Protein: 6.7 g/dL (ref 6.0–8.3)

## 2020-05-13 NOTE — Progress Notes (Signed)
Subjective:    Patient ID: Sandra Owen, female    DOB: [birth year], 69 y.o.   MRN: [id number]  DOS:  05/13/2020 Type of visit - description: Physical exam In general feeling well. She is concerned about her husband's health (he is my patient)  BP Readings from Last 3 Encounters:  05/13/20 (!) 150/81  08/16/19 (!) 155/69  07/31/19 132/75     Review of Systems  Other than above, a 14 point review of systems is negative      Past Medical History:  Diagnosis Date  . Allergic rhinitis   . Anxiety and depression    not currently  . Hypertension   . Hypothyroidism   . PONV (postoperative nausea and vomiting)     Past Surgical History:  Procedure Laterality Date  . ANTERIOR AND POSTERIOR REPAIR N/A 06/25/2013   Procedure: ANTERIOR (CYSTOCELE) AND POSTERIOR REPAIR (RECTOCELE);  Surgeon: Gus Height, MD;  Location: [facility];  Service: Gynecology;  Laterality: N/A;  . [place]  . TONSILLECTOMY    . TUBAL LIGATION    . VAGINAL HYSTERECTOMY N/A 06/25/2013   HYSTERECTOMY VAGINAL;  Surgeon: Gus Height, MD;  Location: [facility];  Service: Gynecology;  Laterality: N/A;   Family History  Problem Relation Age of Onset  . Pancreatic cancer Father   . Hyperlipidemia Mother   . Hypertension Mother   . Heart disease Brother        age 38  . Colon cancer Neg Hx   . Breast cancer Neg Hx   . Thyroid disease Neg Hx     Allergies as of 05/13/2020      Reactions   Codeine Nausea Only   Tape Other (See Comments)   Dermatitis      Medication List       Accurate as of May 13, 2020 11:59 PM. If you have any questions, ask your nurse or doctor.        azelastine 0.1 % nasal spray Commonly known as: ASTELIN Place 2 sprays into both nostrils at bedtime as needed for rhinitis. Use in each nostril as directed   cetirizine 10 MG tablet Commonly known as: ZYRTEC Take 10 mg by mouth daily.   hydrochlorothiazide 25 MG tablet Commonly known as: HYDRODIURIL Take 1 tablet (25 mg  total) by mouth daily.   HYDROcodone-acetaminophen 5-325 MG tablet Commonly known as: NORCO/VICODIN Take 1 tablet by mouth daily as needed for moderate pain.   levothyroxine 88 MCG tablet Commonly known as: SYNTHROID Take 1 tablet (88 mcg total) by mouth daily before breakfast.          Objective:   Physical Exam BP (!) 150/81 (BP Location: Left Arm, Patient Position: Sitting, Cuff Size: Small)   Pulse 70   Temp (!) 97.3 F (36.3 C) (Temporal)   Resp 18   Ht 5\' 4"  (1.626 m)   Wt 129 lb 6 oz (58.7 kg)   SpO2 100%   BMI 22.21 kg/m  General: Well developed, NAD, BMI noted Neck: No  thyromegaly  HEENT:  Normocephalic . Face symmetric, atraumatic Lungs:  CTA B Normal respiratory effort, no intercostal retractions, no accessory muscle use. Heart: RRR,  no murmur.  Abdomen:  Not distended, soft, non-tender. No rebound or rigidity.  Palpable, nontender aorta, no bruit Lower extremities: no pretibial edema bilaterally  Skin: Exposed areas without rash. Not pale. Not jaundice Neurologic:  alert & oriented X3.  Speech normal, gait appropriate for age and unassisted Strength symmetric and appropriate  for age.  Psych: Cognition and judgment appear intact.  Cooperative with normal attention span and concentration.  Behavior appropriate. No anxious or depressed appearing.     Assessment      Assessment   HTN Hypothyroidism DJD--shoulder, neck,  on hydrocodone; h/o intolerance to NSAIDs (GI s/e)  H/o Anxiety depression Shingles 12-2015  PLAN Here for CPX HTN: On HCTZ, BP slightly elevated for the second time, rec ambulatory BPs and reassess in 6 months DJD: On hydrocodone, takes it as needed at night.  Check a UDS Anxiety: Related to her husband's behavior, she thinks he has dementia, he is my patient, recommend office visit.  States he won't seek help, counseled. RTC 6 m     This visit occurred during the SARS-CoV-2 public health emergency.  Safety protocols were in  place, including screening questions prior to the visit, additional usage of staff PPE, and extensive cleaning of exam room while observing appropriate contact time as indicated for disinfecting solutions.

## 2020-05-13 NOTE — Patient Instructions (Addendum)
COVID-19 Vaccine Information can be found at: [email or web address].[email or web address] For questions related to vaccine distribution or appointments, please email [email or web address] or call [phone].    Please schedule Medicare Wellness with Glenard Haring.   Check your blood pressure twice a month BP GOAL is between 110/65 and  135/85. If it is consistently higher or lower, let me know   Take calcium and vitamin D supplements over-the-counter daily  GO TO THE LAB : Get the blood work     [place], [place] back for for a checkup in 6 months

## 2020-05-13 NOTE — Progress Notes (Signed)
Pre visit review using our clinic review tool, if applicable. No additional management support is needed unless otherwise documented below in the visit note. 

## 2020-05-14 NOTE — Assessment & Plan Note (Signed)
Here for CPX HTN: On HCTZ, BP slightly elevated for the second time, rec ambulatory BPs and reassess in 6 months DJD: On hydrocodone, takes it as needed at night.  Check a UDS Anxiety: Related to her husband's behavior, she thinks he has dementia, he is my patient, recommend office visit.  States he won't seek help, counseled. RTC 6 m

## 2020-05-14 NOTE — Assessment & Plan Note (Signed)
-  Td 2011 - pnm 13: 04/2014 -Due for: pnm 23, covid, flu shot , shingrex.  Declined ALL, pro>>cons of vaccines in general and Covid in particular discussed. -CCS: IFOB (-) 2017 and 01/2019; 3 modalities discussed again, elected  an IFOB -female care:  Was referred  to [facility] last year, referral failed, advise pt to proceed Last  MMG per [facility] 2014, self breast exam normal, offered another mammogram, benefits discussed, risk of delayed diagnosis with less chances for breast cancer cure discussed.  Patient declined. - Bone health : no records of a previous DEXA; rec ca and vit D; declined DEXA -Diet exercise: She is doing well most of the time. -Labs:   CMP, FLP, CBC, TSH, I fob

## 2020-05-15 LAB — DRUG MONITORING, PANEL 8 WITH CONFIRMATION, URINE
6 Acetylmorphine: NEGATIVE ng/mL (ref <10)
Alcohol Metabolites: POSITIVE ng/mL — AB
Amphetamines: NEGATIVE ng/mL (ref <500)
Benzodiazepines: NEGATIVE ng/mL (ref <100)
Buprenorphine, Urine: NEGATIVE ng/mL (ref <5)
Cocaine Metabolite: NEGATIVE ng/mL (ref <150)
Codeine: NEGATIVE ng/mL (ref <50)
Creatinine: 224 mg/dL
Ethyl Glucuronide (ETG): 7232 ng/mL — ABNORMAL HIGH (ref <500)
Ethyl Sulfate (ETS): 2221 ng/mL — ABNORMAL HIGH (ref <100)
Hydrocodone: NEGATIVE ng/mL (ref <50)
Hydromorphone: 159 ng/mL — ABNORMAL HIGH (ref <50)
MDMA: NEGATIVE ng/mL (ref <500)
Marijuana Metabolite: NEGATIVE ng/mL (ref <20)
Morphine: NEGATIVE ng/mL (ref <50)
Norhydrocodone: 358 ng/mL — ABNORMAL HIGH (ref <50)
Opiates: POSITIVE ng/mL — AB (ref <100)
Oxidant: NEGATIVE ug/mL
Oxycodone: NEGATIVE ng/mL (ref <100)
pH: 6.3 (ref 4.5–9.0)

## 2020-05-15 LAB — DM TEMPLATE

## 2020-06-16 ENCOUNTER — Other Ambulatory Visit: Payer: Self-pay

## 2020-06-16 MED ORDER — LEVOTHYROXINE SODIUM 88 MCG PO TABS: 88.0000 ug | ORAL_TABLET | Freq: Every day | ORAL | 1 refills | Status: DC

## 2020-06-16 MED ORDER — HYDROCHLOROTHIAZIDE 25 MG PO TABS: 25.0000 mg | ORAL_TABLET | Freq: Every day | ORAL | 1 refills | Status: DC

## 2020-06-18 ENCOUNTER — Telehealth: Payer: Self-pay

## 2020-06-18 MED ORDER — LEVOTHYROXINE SODIUM 88 MCG PO TABS: 88.0000 ug | ORAL_TABLET | Freq: Every day | ORAL | 1 refills | Status: DC

## 2020-06-18 MED ORDER — HYDROCHLOROTHIAZIDE 25 MG PO TABS: 25.0000 mg | ORAL_TABLET | Freq: Every day | ORAL | 1 refills | Status: DC

## 2020-06-18 NOTE — Telephone Encounter (Signed)
Pt called to f/u on prescription refill requests from [facility] for hydrochlorothiazide & levothyroxine.  I let pt know scripts were sent in on 06/16/20.  Pt had received text msgs stating scripts had not been filled by provider's office.

## 2020-06-18 NOTE — Telephone Encounter (Signed)
Rx's resent.  

## 2020-07-03 ENCOUNTER — Encounter: Payer: Self-pay | Admitting: Internal Medicine

## 2020-07-09 DIAGNOSIS — M19011 Primary osteoarthritis, right shoulder: Secondary | ICD-10-CM | POA: Diagnosis not present

## 2020-07-09 DIAGNOSIS — M542 Cervicalgia: Secondary | ICD-10-CM | POA: Diagnosis not present

## 2020-07-28 ENCOUNTER — Other Ambulatory Visit: Payer: Self-pay | Admitting: Internal Medicine

## 2020-07-28 DIAGNOSIS — M542 Cervicalgia: Secondary | ICD-10-CM

## 2020-07-28 NOTE — Telephone Encounter (Signed)
Last written: 04/27/20 Last ov: 05/13/20 Next ov: none Contract: 08/15/20  UDS: 05/13/20

## 2020-07-29 MED ORDER — HYDROCODONE-ACETAMINOPHEN 5-325 MG PO TABS: 1.0000 | ORAL_TABLET | Freq: Every day | ORAL | 0 refills | Status: DC | PRN

## 2020-07-29 NOTE — Telephone Encounter (Signed)
PDMP okay, Rx sent 

## 2020-10-25 ENCOUNTER — Other Ambulatory Visit: Payer: Self-pay | Admitting: Internal Medicine

## 2020-10-25 DIAGNOSIS — M542 Cervicalgia: Secondary | ICD-10-CM

## 2020-10-26 ENCOUNTER — Telehealth: Payer: Self-pay | Admitting: Internal Medicine

## 2020-10-26 ENCOUNTER — Encounter: Payer: Self-pay | Admitting: Internal Medicine

## 2020-10-26 DIAGNOSIS — M542 Cervicalgia: Secondary | ICD-10-CM

## 2020-10-26 MED ORDER — HYDROCODONE-ACETAMINOPHEN 5-325 MG PO TABS: 1.0000 | ORAL_TABLET | Freq: Every day | ORAL | 0 refills | Status: DC | PRN

## 2020-10-26 NOTE — Telephone Encounter (Signed)
Requesting: hydrocodone 5-325mg  Contract: 08/15/2018 UDS: 05/13/20 Last Visit: 05/13/20 Next Visit: None scheduled Last Refill: 07/29/2020 #90 and 0RF Pt sig: 1 tab daily prn  Please Advise

## 2020-10-26 NOTE — Telephone Encounter (Signed)
Pt requesting a 90 day supply of hydrocodone as its cheaper for her. Can you resend?

## 2020-10-26 NOTE — Telephone Encounter (Signed)
See mychart message- Pt requesting 90 day supply of hydrocodone-informed her that Dr. Larose Kells did send a 30 day supply however for further quantity she will need a visit. See last AVS on 05/13/2020 requesting return back in 6 months.

## 2020-10-26 NOTE — Telephone Encounter (Signed)
Letter released to [place] she is due for visit.

## 2020-10-26 NOTE — Telephone Encounter (Signed)
90 day prescription sent

## 2020-10-26 NOTE — Telephone Encounter (Signed)
PDMP okay, she is due for a visit, 30-day supply sent, please send her a message ask her to schedule an appointment with me.

## 2020-10-26 NOTE — Telephone Encounter (Signed)
Patient states she would like a call back in reference to the letter she received about her follow up appointment

## 2020-10-28 ENCOUNTER — Ambulatory Visit: Payer: Medicare Other | Admitting: Internal Medicine

## 2020-10-28 ENCOUNTER — Other Ambulatory Visit: Payer: Self-pay

## 2020-10-28 ENCOUNTER — Encounter: Payer: Self-pay | Admitting: Internal Medicine

## 2020-10-28 VITALS — BP 157/78 | HR 57 | Temp 98.4°F | Resp 16 | Ht 64.0 in | Wt 140.5 lb

## 2020-10-28 DIAGNOSIS — M159 Polyosteoarthritis, unspecified: Secondary | ICD-10-CM

## 2020-10-28 DIAGNOSIS — I10 Essential (primary) hypertension: Secondary | ICD-10-CM | POA: Diagnosis not present

## 2020-10-28 DIAGNOSIS — M8949 Other hypertrophic osteoarthropathy, multiple sites: Secondary | ICD-10-CM

## 2020-10-28 DIAGNOSIS — E039 Hypothyroidism, unspecified: Secondary | ICD-10-CM

## 2020-10-28 MED ORDER — DICLOFENAC SODIUM 1 % EX GEL
2.0000 g | Freq: Four times a day (QID) | CUTANEOUS | 1 refills | Status: DC | PRN
Start: 2020-10-28 — End: 2021-11-30

## 2020-10-28 MED ORDER — TETANUS-DIPHTH-ACELL PERTUSSIS 5-2.5-18.5 LF-MCG/0.5 IM SUSP: 0.5000 mL | Freq: Once | INTRAMUSCULAR | 0 refills | Status: AC

## 2020-10-28 NOTE — Progress Notes (Signed)
Subjective:    Patient ID: Sandra Owen, female    DOB: 1951/07/16, 69 y.o.   MRN: [id number]  DOS:  10/28/2020 Type of visit - description: Routine checkup In general feels well. Has DJD, c/o pain  mostly at the thumb, R>L.  Denies redness or swelling Emotionally doing okay. No ambulatory BPs.    Review of Systems See above   Past Medical History:  Diagnosis Date  . Allergic rhinitis   . Anxiety and depression    not currently  . Hypertension   . Hypothyroidism   . PONV (postoperative nausea and vomiting)     Past Surgical History:  Procedure Laterality Date  . ANTERIOR AND POSTERIOR REPAIR N/A 06/25/2013   Procedure: ANTERIOR (CYSTOCELE) AND POSTERIOR REPAIR (RECTOCELE);  Surgeon: Gus Height, MD;  Location: [facility];  Service: Gynecology;  Laterality: N/A;  . [place]  . TONSILLECTOMY    . TUBAL LIGATION    . VAGINAL HYSTERECTOMY N/A 06/25/2013   HYSTERECTOMY VAGINAL;  Surgeon: Gus Height, MD;  Location: [facility];  Service: Gynecology;  Laterality: N/A;    Allergies as of 10/28/2020      Reactions   Codeine Nausea Only   Tape Other (See Comments)   Dermatitis      Medication List       Accurate as of October 28, 2020 11:59 PM. If you have any questions, ask your nurse or doctor.        STOP taking these medications   cetirizine 10 MG tablet Commonly known as: ZYRTEC Stopped by: Kathlene November, MD     TAKE these medications   azelastine 0.1 % nasal spray Commonly known as: ASTELIN Place 2 sprays into both nostrils at bedtime as needed for rhinitis. Use in each nostril as directed   diclofenac Sodium 1 % Gel Commonly known as: VOLTAREN Apply 2 g topically 4 (four) times daily as needed. Started by: Kathlene November, MD   hydrochlorothiazide 25 MG tablet Commonly known as: HYDRODIURIL Take 1 tablet (25 mg total) by mouth daily.   HYDROcodone-acetaminophen 5-325 MG tablet Commonly known as: NORCO/VICODIN Take 1 tablet by mouth daily as needed for  moderate pain.   levothyroxine 88 MCG tablet Commonly known as: SYNTHROID Take 1 tablet (88 mcg total) by mouth daily before breakfast.   Tdap 5-2.5-18.5 LF-MCG/0.5 injection Commonly known as: BOOSTRIX Inject 0.5 mLs into the muscle once for 1 dose. Started by: Kathlene November, MD          Objective:   Physical Exam BP (!) 157/78 (BP Location: Left Arm, Patient Position: Sitting, Cuff Size: Small)   Pulse (!) 57   Temp 98.4 F (36.9 C) (Oral)   Resp 16   Ht 5\' 4"  (1.626 m)   Wt 140 lb 8 oz (63.7 kg)   SpO2 99%   BMI 24.12 kg/m  General:   Well developed, NAD, BMI noted. HEENT:  Normocephalic . Face symmetric, atraumatic Lungs:  CTA B Normal respiratory effort, no intercostal retractions, no accessory muscle use. Heart: RRR,  no murmur. MSK: Hands and wrists with bony changes consistent with DJD, no redness, warmness. Lower extremities: no pretibial edema bilaterally  Skin: Not pale. Not jaundice Neurologic:  alert & oriented X3.  Speech normal, gait appropriate for age and unassisted Psych--  Cognition and judgment appear intact.  Cooperative with normal attention span and concentration.  Behavior appropriate. No anxious or depressed appearing.      Assessment    Assessment  HTN Hypothyroidism DJD--shoulder, neck,  on hydrocodone; h/o intolerance to NSAIDs (GI s/e)  H/o Anxiety depression Shingles 12-2015  PLAN HTN: On HCTZ, BP today slightly elevated, continue HCTZ, last BMP okay, recommend ambulatory BPs. Hypothyroidism: Checking a TSH DJD: Current pain is mostly at the right thumb, on hydrocodone, contract signed.  Rec topical NSAIDs Anxiety: See last visit, she is doing "okay", found good ways to cope with her husband behavior. Preventive care: Had covid vax, decline others, nevertheless Tdap printed in case she changes her mind. Reissue a I fob, we gave her a expired tube   RTC 6 months CPX   This visit occurred during the SARS-CoV-2 public health  emergency.  Safety protocols were in place, including screening questions prior to the visit, additional usage of staff PPE, and extensive cleaning of exam room while observing appropriate contact time as indicated for disinfecting solutions.

## 2020-10-28 NOTE — Patient Instructions (Addendum)
Check the  blood pressure twice a month BP GOAL is between 110/65 and  135/85. If it is consistently higher or lower, let me know   Try the topical diclofenac, either the prescription or the OTC formulation   GO TO THE LAB : Get the blood work     [place], [place] back for a physical exam in 6 months

## 2020-10-28 NOTE — Progress Notes (Signed)
Pre visit review using our clinic review tool, if applicable. No additional management support is needed unless otherwise documented below in the visit note. 

## 2020-10-29 LAB — TSH: TSH: 0.65 u[IU]/mL (ref 0.35–4.50)

## 2020-10-29 NOTE — Assessment & Plan Note (Signed)
HTN: On HCTZ, BP today slightly elevated, continue HCTZ, last BMP okay, recommend ambulatory BPs. Hypothyroidism: Checking a TSH DJD: Current pain is mostly at the right thumb, on hydrocodone, contract signed.  Rec topical NSAIDs Anxiety: See last visit, she is doing "okay", found good ways to cope with her husband behavior. Preventive care: Had covid vax, decline others, nevertheless Tdap printed in case she changes her mind. Reissue a I fob, we gave her a expired tube   RTC 6 months CPX

## 2020-11-05 DIAGNOSIS — Z20822 Contact with and (suspected) exposure to covid-19: Secondary | ICD-10-CM | POA: Diagnosis not present

## 2020-12-02 ENCOUNTER — Other Ambulatory Visit: Payer: Self-pay

## 2020-12-02 MED ORDER — LEVOTHYROXINE SODIUM 88 MCG PO TABS: 88.0000 ug | ORAL_TABLET | Freq: Every day | ORAL | 1 refills | Status: DC

## 2020-12-02 MED ORDER — HYDROCHLOROTHIAZIDE 25 MG PO TABS: 25.0000 mg | ORAL_TABLET | Freq: Every day | ORAL | 1 refills | Status: DC

## 2021-01-22 ENCOUNTER — Telehealth: Payer: Self-pay | Admitting: Internal Medicine

## 2021-01-22 DIAGNOSIS — M542 Cervicalgia: Secondary | ICD-10-CM

## 2021-01-22 NOTE — Telephone Encounter (Signed)
Requesting: hydrocodone 5-325mg  Contract: 10/28/2020 UDS: 05/13/2020 Last Visit: 10/28/2020 Next Visit: None Last Refill: 10/26/2020 #90 and 0RF Pt sig: 1 tab daily prn  Please Advise

## 2021-01-24 MED ORDER — HYDROCODONE-ACETAMINOPHEN 5-325 MG PO TABS: 1.0000 | ORAL_TABLET | Freq: Every day | ORAL | 0 refills | Status: DC | PRN

## 2021-01-24 NOTE — Telephone Encounter (Signed)
PDMP okay, Rx sent 

## 2021-02-17 DIAGNOSIS — H52221 Regular astigmatism, right eye: Secondary | ICD-10-CM | POA: Diagnosis not present

## 2021-02-17 DIAGNOSIS — H5213 Myopia, bilateral: Secondary | ICD-10-CM | POA: Diagnosis not present

## 2021-02-17 DIAGNOSIS — H1032 Unspecified acute conjunctivitis, left eye: Secondary | ICD-10-CM | POA: Diagnosis not present

## 2021-02-17 DIAGNOSIS — H524 Presbyopia: Secondary | ICD-10-CM | POA: Diagnosis not present

## 2021-02-18 ENCOUNTER — Ambulatory Visit: Payer: Medicare Other | Admitting: Internal Medicine

## 2021-03-02 ENCOUNTER — Encounter: Payer: Self-pay | Admitting: Internal Medicine

## 2021-03-04 DIAGNOSIS — Z6825 Body mass index (BMI) 25.0-25.9, adult: Secondary | ICD-10-CM | POA: Diagnosis not present

## 2021-03-04 DIAGNOSIS — E663 Overweight: Secondary | ICD-10-CM | POA: Diagnosis not present

## 2021-03-04 DIAGNOSIS — K117 Disturbances of salivary secretion: Secondary | ICD-10-CM | POA: Diagnosis not present

## 2021-03-04 DIAGNOSIS — H04123 Dry eye syndrome of bilateral lacrimal glands: Secondary | ICD-10-CM | POA: Diagnosis not present

## 2021-04-01 DIAGNOSIS — H524 Presbyopia: Secondary | ICD-10-CM | POA: Diagnosis not present

## 2021-04-16 ENCOUNTER — Encounter: Payer: Self-pay | Admitting: Internal Medicine

## 2021-04-16 ENCOUNTER — Ambulatory Visit (INDEPENDENT_AMBULATORY_CARE_PROVIDER_SITE_OTHER): Payer: Medicare Other | Admitting: Internal Medicine

## 2021-04-16 ENCOUNTER — Other Ambulatory Visit: Payer: Self-pay

## 2021-04-16 VITALS — BP 152/79 | HR 67 | Temp 97.3°F | Ht 64.0 in | Wt 138.8 lb

## 2021-04-16 DIAGNOSIS — Z0000 Encounter for general adult medical examination without abnormal findings: Secondary | ICD-10-CM

## 2021-04-16 DIAGNOSIS — Z1231 Encounter for screening mammogram for malignant neoplasm of breast: Secondary | ICD-10-CM

## 2021-04-16 DIAGNOSIS — E039 Hypothyroidism, unspecified: Secondary | ICD-10-CM | POA: Diagnosis not present

## 2021-04-16 DIAGNOSIS — H04129 Dry eye syndrome of unspecified lacrimal gland: Secondary | ICD-10-CM | POA: Insufficient documentation

## 2021-04-16 DIAGNOSIS — M8949 Other hypertrophic osteoarthropathy, multiple sites: Secondary | ICD-10-CM

## 2021-04-16 DIAGNOSIS — Z79899 Other long term (current) drug therapy: Secondary | ICD-10-CM

## 2021-04-16 DIAGNOSIS — M542 Cervicalgia: Secondary | ICD-10-CM

## 2021-04-16 DIAGNOSIS — M159 Polyosteoarthritis, unspecified: Secondary | ICD-10-CM

## 2021-04-16 DIAGNOSIS — I10 Essential (primary) hypertension: Secondary | ICD-10-CM

## 2021-04-16 MED ORDER — HYDROCODONE-ACETAMINOPHEN 5-325 MG PO TABS: 1.0000 | ORAL_TABLET | Freq: Every day | ORAL | 0 refills | Status: DC | PRN

## 2021-04-16 NOTE — Addendum Note (Signed)
Addended byDamita Dunnings D on: 04/16/2021 02:21 PM   Modules accepted: Orders

## 2021-04-16 NOTE — Patient Instructions (Addendum)
I have placed an order for your mammogram. Please go downstairs ([address]) to schedule at your earliest convenience. Your last one was in 2014.  You can get Tdap at your local pharmacy, this is a combination of the tetanus  plus the whooping cough immunization.  Check the  blood pressure   BP GOAL is between 110/65 and  135/85. If it is consistently higher or lower, let me know   GO TO THE LAB : Get the blood work       [place], [place] back for   a checkup in 6 months   "Living will", "[place] of attorney": Advanced care planning  (If you already have a living will or healthcare power of attorney, please bring the copy to be scanned in your chart.)  Advance care planning is a process that supports adults in  understanding and sharing their preferences regarding future medical care.   The patient's preferences are recorded in documents called Advance Directives.    Advanced directives are completed (and can be modified at any time) while the patient is in full mental capacity.   The documentation should be available at all times to the patient, the family and the healthcare providers.  Bring in a copy to be scanned in your chart is an excellent idea and is recommended   This legal documents direct treatment decision making and/or appoint a surrogate to make the decision if the patient is not capable to do so.    Advance directives can be documented in many types of formats,  documents have names such as:  Lliving will  Durable power of attorney for healthcare (healthcare proxy or healthcare power of attorney)  Combined directives  Physician orders for life-sustaining treatment    More information at:  [email or web address]

## 2021-04-16 NOTE — Progress Notes (Signed)
Subjective:    Patient ID: Sandra Owen, female    DOB: 08/18/1951, 70 y.o.   MRN: [id number]  DOS:  04/16/2021 Type of visit - description: CPX  Since the last office visit is doing well, except for some anxiety. She is here for CPX  Reports that anxiety is related to her husband having possibly Alzheimer's. She continue with pain, denies fever chills, headaches or weight loss.   BP Readings from Last 3 Encounters:  04/16/21 (!) 152/79  10/28/20 (!) 157/78  05/13/20 (!) 150/81     Review of Systems  Other than above, a 14 point review of systems is negative      Past Medical History:  Diagnosis Date  . Allergic rhinitis   . Anxiety and depression    not currently  . Hypertension   . Hypothyroidism   . PONV (postoperative nausea and vomiting)     Past Surgical History:  Procedure Laterality Date  . ANTERIOR AND POSTERIOR REPAIR N/A 06/25/2013   Procedure: ANTERIOR (CYSTOCELE) AND POSTERIOR REPAIR (RECTOCELE);  Surgeon: Gus Height, MD;  Location: [facility];  Service: Gynecology;  Laterality: N/A;  . [place]  . TONSILLECTOMY    . TUBAL LIGATION    . VAGINAL HYSTERECTOMY N/A 06/25/2013   HYSTERECTOMY VAGINAL;  Surgeon: Gus Height, MD;  Location: [facility];  Service: Gynecology;  Laterality: N/A;   Social History   Socioeconomic History  . Marital status: Married    Spouse name: Not on file  . Number of children: 1  . Years of education: Not on file  . Highest education level: Not on file  Occupational History  . 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 Smoking status: Never Smoker  . Smokeless tobacco: Never Used  Substance and Sexual Activity  . Alcohol use: Yes    Comment: socially  . Drug use: No  . Sexual activity: Not on file  Other Topics Concern  . Not on file  Social History Narrative   Lives w/ husband   Social Determinants of Health   Financial Resource Strain: Not on file  Food Insecurity: Not on file   Transportation Needs: Not on file  Physical Activity: Not on file  Stress: Not on file  Social Connections: Not on file  Intimate Partner Violence: Not on file     Allergies as of 04/16/2021      Reactions   Codeine Nausea Only   Tape Other (See Comments)   Dermatitis      Medication List       Accurate as of April 16, 2021 11:59 PM. If you have any questions, ask your nurse or doctor.        STOP taking these medications   azelastine 0.1 % nasal spray Commonly known as: ASTELIN Stopped by: Kathlene November, MD     TAKE these medications   diclofenac Sodium 1 % Gel Commonly known as: VOLTAREN Apply 2 g topically 4 (four) times daily as needed.   hydrochlorothiazide 25 MG tablet Commonly known as: HYDRODIURIL Take 1 tablet (25 mg total) by mouth daily.   HYDROcodone-acetaminophen 5-325 MG tablet Commonly known as: NORCO/VICODIN Take 1 tablet by mouth daily as needed for moderate pain.   levothyroxine 88 MCG tablet Commonly known as: SYNTHROID Take 1 tablet (88 mcg total) by mouth daily before breakfast.          Objective:   Physical Exam BP (!) 152/79 (BP Location: Left Arm, Patient  Position: Sitting, Cuff Size: Large)   Pulse 67   Temp (!) 97.3 F (36.3 C) (Temporal)   Ht 5\' 4"  (1.626 m)   Wt 138 lb 12.8 oz (63 kg)   SpO2 98%   BMI 23.82 kg/m  General: Well developed, NAD, BMI noted Neck: No  thyromegaly  HEENT:  Normocephalic . Face symmetric, atraumatic Lungs:  CTA B Normal respiratory effort, no intercostal retractions, no accessory muscle use. Heart: RRR,  no murmur.  Abdomen:  Not distended, soft, non-tender. No rebound or rigidity.   Lower extremities: no pretibial edema bilaterally  Skin: Exposed areas without rash. Not pale. Not jaundice Neurologic:  alert & oriented X3.  Speech normal, gait appropriate for age and unassisted Strength symmetric and appropriate for age.  Psych: Cognition and judgment appear intact.  Cooperative with  normal attention span and concentration.  Behavior appropriate. No anxious or depressed appearing.     Assessment     Assessment   HTN Hypothyroidism DJD--shoulder, neck,  on hydrocodone; h/o intolerance to NSAIDs (GI s/e)  H/o Anxiety depression Shingles 12-2015  PLAN Here for CPX HTN: On HCTZ, BPs are typically slightly elevated at the office, at home reportedly in the 130/80 range.  No change, labs Hypothyroidism: On Synthroid, checking labs DJD: On hydrocodone, typically once daily, UDS today, PDMP okay, RF sent.  .  Encouraged to continue trying topical NSAIDs. Anxiety: Related to her husband possibly having dementia.  She will set a follow-up if she feels medication is needed Saw rheumatology recently at the request of her eye doctor, reportedly rheumatology did additional work-up and it was negative. RTC 6 months  This visit occurred during the SARS-CoV-2 public health emergency.  Safety protocols were in place, including screening questions prior to the visit, additional usage of staff PPE, and extensive cleaning of exam room while observing appropriate contact time as indicated for disinfecting solutions.

## 2021-04-17 ENCOUNTER — Encounter: Payer: Self-pay | Admitting: Internal Medicine

## 2021-04-17 LAB — CBC WITH DIFFERENTIAL/PLATELET
Absolute Monocytes: 500 cells/uL (ref 200–950)
Basophils Absolute: 60 cells/uL (ref 0–200)
Basophils Relative: 1.2 %
Eosinophils Absolute: 130 cells/uL (ref 15–500)
Eosinophils Relative: 2.6 %
HCT: 41.5 % (ref 35.0–45.0)
Hemoglobin: 14.3 g/dL (ref 11.7–15.5)
Lymphs Abs: 2100 cells/uL (ref 850–3900)
MCH: 31.9 pg (ref 27.0–33.0)
MCHC: 34.5 g/dL (ref 32.0–36.0)
MCV: 92.6 fL (ref 80.0–100.0)
MPV: 11.4 fL (ref 7.5–12.5)
Monocytes Relative: 10 %
Neutro Abs: 2210 cells/uL (ref 1500–7800)
Neutrophils Relative %: 44.2 %
Platelets: 235 10*3/uL (ref 140–400)
RBC: 4.48 10*6/uL (ref 3.80–5.10)
RDW: 11.9 % (ref 11.0–15.0)
Total Lymphocyte: 42 %
WBC: 5 10*3/uL (ref 3.8–10.8)

## 2021-04-17 LAB — DRUG MONITORING, PANEL 8 WITH CONFIRMATION, URINE
6 Acetylmorphine: NEGATIVE ng/mL (ref <10)
Alcohol Metabolites: NEGATIVE ng/mL
Amphetamines: NEGATIVE ng/mL (ref <500)
Benzodiazepines: NEGATIVE ng/mL (ref <100)
Buprenorphine, Urine: NEGATIVE ng/mL (ref <5)
Cocaine Metabolite: NEGATIVE ng/mL (ref <150)
Creatinine: 73.5 mg/dL
MDMA: NEGATIVE ng/mL (ref <500)
Marijuana Metabolite: NEGATIVE ng/mL (ref <20)
Opiates: NEGATIVE ng/mL (ref <100)
Oxidant: NEGATIVE ug/mL
Oxycodone: NEGATIVE ng/mL (ref <100)
pH: 5.8 (ref 4.5–9.0)

## 2021-04-17 LAB — LIPID PANEL
Cholesterol: 215 mg/dL — ABNORMAL HIGH (ref <200)
HDL: 82 mg/dL (ref >=50)
LDL Cholesterol (Calc): 114 mg/dL (calc) — ABNORMAL HIGH
Non-HDL Cholesterol (Calc): 133 mg/dL (calc) — ABNORMAL HIGH (ref <130)
Total CHOL/HDL Ratio: 2.6 (calc) (ref <5.0)
Triglycerides: 88 mg/dL (ref <150)

## 2021-04-17 LAB — COMPREHENSIVE METABOLIC PANEL
AG Ratio: 2.2 (calc) (ref 1.0–2.5)
ALT: 15 U/L (ref 6–29)
AST: 20 U/L (ref 10–35)
Albumin: 4.6 g/dL (ref 3.6–5.1)
Alkaline phosphatase (APISO): 53 U/L (ref 37–153)
BUN: 25 mg/dL (ref 7–25)
CO2: 27 mmol/L (ref 20–32)
Calcium: 10.1 mg/dL (ref 8.6–10.4)
Chloride: 101 mmol/L (ref 98–110)
Creat: 0.88 mg/dL (ref 0.50–0.99)
Globulin: 2.1 g/dL (calc) (ref 1.9–3.7)
Glucose, Bld: 84 mg/dL (ref 65–99)
Potassium: 4 mmol/L (ref 3.5–5.3)
Sodium: 139 mmol/L (ref 135–146)
Total Bilirubin: 0.6 mg/dL (ref 0.2–1.2)
Total Protein: 6.7 g/dL (ref 6.1–8.1)

## 2021-04-17 LAB — TSH: TSH: 1.2 mIU/L (ref 0.40–4.50)

## 2021-04-17 LAB — DM TEMPLATE

## 2021-04-17 NOTE — Assessment & Plan Note (Signed)
Here for CPX HTN: On HCTZ, BPs are typically slightly elevated at the office, at home reportedly in the 130/80 range.  No change, labs Hypothyroidism: On Synthroid, checking labs DJD: On hydrocodone, typically once daily, UDS today, PDMP okay, RF sent.  .  Encouraged to continue trying topical NSAIDs. Anxiety: Related to her husband possibly having dementia.  She will set a follow-up if she feels medication is needed Saw rheumatology recently at the request of her eye doctor, reportedly rheumatology did additional work-up and it was negative. RTC 6 months

## 2021-04-17 NOTE — Assessment & Plan Note (Signed)
-  Td 2011, Tdap  recommended - pnm 13: 04/2014 -Additional immunization: She declined it all except possibly a Tdap, she has been very consistent on this issue. -CCS: IFOB (-) 2017.  She just drop a sample for a IFOB today -female care: Consistently declined to see gynecology Last  MMG per [facility] 2014, order for mammogram placed, encouraged to set up. -Bone health: Declines a DEXA -Diet exercise: She is doing well most of the time. -Labs: UDS, CMP, FLP, CBC, TSH - Power of attorney discussed

## 2021-04-20 ENCOUNTER — Other Ambulatory Visit (INDEPENDENT_AMBULATORY_CARE_PROVIDER_SITE_OTHER): Payer: Medicare Other

## 2021-04-20 DIAGNOSIS — Z0000 Encounter for general adult medical examination without abnormal findings: Secondary | ICD-10-CM | POA: Diagnosis not present

## 2021-04-20 LAB — FECAL OCCULT BLOOD, IMMUNOCHEMICAL: Fecal Occult Bld: NEGATIVE

## 2021-05-13 ENCOUNTER — Other Ambulatory Visit: Payer: Self-pay

## 2021-05-13 MED ORDER — LEVOTHYROXINE SODIUM 88 MCG PO TABS: 88.0000 ug | ORAL_TABLET | Freq: Every day | ORAL | 1 refills | Status: DC

## 2021-05-13 MED ORDER — HYDROCHLOROTHIAZIDE 25 MG PO TABS: 25.0000 mg | ORAL_TABLET | Freq: Every day | ORAL | 1 refills | Status: DC

## 2021-07-19 ENCOUNTER — Telehealth: Payer: Self-pay | Admitting: Internal Medicine

## 2021-07-19 DIAGNOSIS — M542 Cervicalgia: Secondary | ICD-10-CM

## 2021-07-19 MED ORDER — HYDROCODONE-ACETAMINOPHEN 5-325 MG PO TABS: 1.0000 | ORAL_TABLET | Freq: Every day | ORAL | 0 refills | Status: DC | PRN

## 2021-07-19 NOTE — Telephone Encounter (Signed)
PDMP okay, Rx sent 

## 2021-07-19 NOTE — Telephone Encounter (Signed)
Requesting: hydrocodone 5-'325mg'$  Contract: 10/28/2020 UDS: 04/16/2021 Last Visit: 04/16/2021 Next Visit: None Last Refill: 04/16/2021 #90 and 0RF Pt sig: 1 tab daily prn  Please Advise

## 2021-08-18 ENCOUNTER — Telehealth: Payer: Self-pay | Admitting: Internal Medicine

## 2021-08-18 NOTE — Telephone Encounter (Signed)
Left message for patient to call back and schedule Medicare Annual Wellness Visit (AWV) in office.  ° °If not able to come in office, please offer to do virtually or by telephone.  Left office number and my jabber #[phone]. ° °Due for AWVI ° °Please schedule at anytime with Nurse Health Advisor. °  °

## 2021-09-20 ENCOUNTER — Ambulatory Visit
Admission: EM | Admit: 2021-09-20 | Discharge: 2021-09-20 | Disposition: A | Discharge status: Home / Self Care | Payer: Medicare Other | Attending: Internal Medicine | Admitting: Internal Medicine

## 2021-09-20 ENCOUNTER — Telehealth: Payer: Self-pay | Admitting: Internal Medicine

## 2021-09-20 ENCOUNTER — Encounter: Payer: Self-pay | Admitting: Emergency Medicine

## 2021-09-20 DIAGNOSIS — N3001 Acute cystitis with hematuria: Secondary | ICD-10-CM | POA: Diagnosis not present

## 2021-09-20 DIAGNOSIS — R3915 Urgency of urination: Secondary | ICD-10-CM | POA: Diagnosis not present

## 2021-09-20 LAB — POCT URINALYSIS DIP (MANUAL ENTRY)
Glucose, UA: NEGATIVE mg/dL
Nitrite, UA: NEGATIVE
Protein Ur, POC: NEGATIVE mg/dL
Spec Grav, UA: 1.025 (ref 1.010–1.025)
Urobilinogen, UA: 0.2 E.U./dL
pH, UA: 6 (ref 5.0–8.0)

## 2021-09-20 MED ORDER — CEPHALEXIN 500 MG PO CAPS: 500.0000 mg | ORAL_CAPSULE | Freq: Four times a day (QID) | ORAL | 0 refills | Status: AC

## 2021-09-20 NOTE — ED Provider Notes (Signed)
[facility]    CSN: [id number] Arrival date & time: 09/20/21  1632      History   Chief Complaint Chief Complaint  Patient presents with   Hematuria    HPI Sandra Owen is a 70 y.o. female.   Patient presents with hematuria, nausea, abdominal pressure, urinary urgency that has been present for approximately 1 day.  Patient reports that hematuria is mild and urine is "blood-tinged".  T-max at home was 100 but has now resolved.  Denies any fever, vaginal discharge, pelvic pain.  Patient reports that she has had urinary tract infections before and this "feels similar".  Also reports history of kidney stones but states that "this does not feel like a kidney stone".  Denies any known exposure to an STD.   Hematuria   Past Medical History:  Diagnosis Date   Allergic rhinitis    Anxiety and depression    not currently   Hypertension    Hypothyroidism    PONV (postoperative nausea and vomiting)     Patient Active Problem List   Diagnosis Date Noted   Tear film insufficiency 04/16/2021   PCP NOTES >>>> 11/19/2015   Annual physical exam 03/16/2015   Neck pain 07/14/2014   DJD -pain managment 03/16/2011   Hypothyroidism 08/10/2007   COMMON MIGRAINE 08/10/2007   Essential hypertension 08/10/2007   ALLERGIC RHINITIS 08/10/2007    Past Surgical History:  Procedure Laterality Date   ANTERIOR AND POSTERIOR REPAIR N/A 06/25/2013   Procedure: ANTERIOR (CYSTOCELE) AND POSTERIOR REPAIR (RECTOCELE);  Surgeon: Gus Height, MD;  Location: [facility];  Service: Gynecology;  Laterality: N/A;   BUNIONECTOMY  1999   TONSILLECTOMY     TUBAL LIGATION     VAGINAL HYSTERECTOMY N/A 06/25/2013   HYSTERECTOMY VAGINAL;  Surgeon: Gus Height, MD;  Location: [facility];  Service: Gynecology;  Laterality: N/A;    OB History   No obstetric history on file.      Home Medications    Prior to Admission medications   Medication Sig Start Date End Date Taking? Authorizing Provider   cephALEXin (KEFLEX) 500 MG capsule Take 1 capsule (500 mg total) by mouth 4 (four) times daily for 5 days. 09/20/21 09/25/21 Yes Odis Luster, FNP  hydrochlorothiazide (HYDRODIURIL) 25 MG tablet Take 1 tablet (25 mg total) by mouth daily. 05/13/21  Yes Paz, Alda Berthold, MD  HYDROcodone-acetaminophen (NORCO/VICODIN) 5-325 MG tablet Take 1 tablet by mouth daily as needed for moderate pain. 07/19/21  Yes Colon Branch, MD  levothyroxine (SYNTHROID) 88 MCG tablet Take 1 tablet (88 mcg total) by mouth daily before breakfast. 05/13/21  Yes Paz, Alda Berthold, MD  diclofenac Sodium (VOLTAREN) 1 % GEL Apply 2 g topically 4 (four) times daily as needed. Patient not taking: Reported on 04/16/2021 10/28/20   Colon Branch, MD    Family History Family History  Problem Relation Age of Onset   Pancreatic cancer Father    Hyperlipidemia Mother    Hypertension Mother    Heart disease Brother        age 49   Colon cancer Neg Hx    Breast cancer Neg Hx    Thyroid disease Neg Hx     Social History Social History   Tobacco Use   Smoking status: Never   Smokeless tobacco: Never  Substance Use Topics   Alcohol use: Yes    Comment: socially   Drug use: No     Allergies   Codeine and Tape  Review of Systems Review of Systems Per HPI  Physical Exam Triage Vital Signs ED Triage Vitals  Enc Vitals Group     BP 09/20/21 1724 (!) 174/93     Pulse Rate 09/20/21 1724 80     Resp 09/20/21 1724 18     Temp 09/20/21 1724 98.1 F (36.7 C)     Temp Source 09/20/21 1724 Oral     SpO2 09/20/21 1724 97 %     Weight 09/20/21 1725 138 lb (62.6 kg)     Height 09/20/21 1725 5\' 4"  (1.626 m)     Head Circumference --      Peak Flow --      Pain Score 09/20/21 1725 0     Pain Loc --      Pain Edu? --      Excl. in [place]? --    No data found.  Updated Vital Signs BP (!) 130/94 (BP Location: Left Arm)   Pulse 80   Temp 98.1 F (36.7 C) (Oral)   Resp 18   Ht 5\' 4"  (1.626 m)   Wt 138 lb (62.6 kg)   SpO2 97%   BMI  23.69 kg/m   Visual Acuity Right Eye Distance:   Left Eye Distance:   Bilateral Distance:    Right Eye Near:   Left Eye Near:    Bilateral Near:     Physical Exam Constitutional:      General: She is not in acute distress.    Appearance: Normal appearance. She is not toxic-appearing or diaphoretic.  HENT:     Head: Normocephalic and atraumatic.  Eyes:     Extraocular Movements: Extraocular movements intact.     Conjunctiva/sclera: Conjunctivae normal.  Cardiovascular:     Rate and Rhythm: Normal rate and regular rhythm.     Pulses: Normal pulses.     Heart sounds: Normal heart sounds.  Pulmonary:     Effort: Pulmonary effort is normal. No respiratory distress.     Breath sounds: Normal breath sounds.  Abdominal:     General: Bowel sounds are normal. There is no distension.     Palpations: Abdomen is soft.     Tenderness: There is no abdominal tenderness.  Skin:    General: Skin is warm and dry.  Neurological:     General: No focal deficit present.     Mental Status: She is alert and oriented to person, place, and time. Mental status is at baseline.  Psychiatric:        Mood and Affect: Mood normal.        Behavior: Behavior normal.        Thought Content: Thought content normal.        Judgment: Judgment normal.     UC Treatments / Results  Labs (all labs ordered are listed, but only abnormal results are displayed) Labs Reviewed  POCT URINALYSIS DIP (MANUAL ENTRY) - Abnormal; Notable for the following components:      Result Value   Bilirubin, UA small (*)    Ketones, POC UA small (15) (*)    Blood, UA trace-intact (*)    Leukocytes, UA Small (1+) (*)    All other components within normal limits  URINE CULTURE    EKG   Radiology No results found.  Procedures Procedures (including critical care time)  Medications Ordered in UC Medications - No data to display  Initial Impression / Assessment and Plan / UC Course  I have reviewed the triage vital  signs and the nursing notes.  Pertinent labs & imaging results that were available during my care of the patient were reviewed by me and considered in my medical decision making (see chart for details).     Urinalysis showing signs of urinary tract infection.  Urine culture is pending.  Will treat with cephalexin antibiotic.  Low suspicion for kidney stone given patient's clinical symptoms and physical exam.  Advised patient to go to the hospital if symptoms significantly worsen or if no improvement in symptoms in the next 48 hours.Discussed strict return precautions. Patient verbalized understanding and is agreeable with plan.  Final Clinical Impressions(s) / UC Diagnoses   Final diagnoses:  Urinary urgency  Acute cystitis with hematuria     Discharge Instructions      Your urine did show signs of urinary tract infection.  You are being treated with cephalexin antibiotic.  Urine culture is pending.  We will call if they are positive.  Please go the hospital if symptoms significantly worsen.     ED Prescriptions     Medication Sig Dispense Auth. Provider   cephALEXin (KEFLEX) 500 MG capsule Take 1 capsule (500 mg total) by mouth 4 (four) times daily for 5 days. 20 capsule Odis Luster, FNP      PDMP not reviewed this encounter.   Odis Luster, FNP 09/20/21 1758

## 2021-09-20 NOTE — Discharge Instructions (Addendum)
Your urine did show signs of urinary tract infection.  You are being treated with cephalexin antibiotic.  Urine culture is pending.  We will call if they are positive.  Please go the hospital if symptoms significantly worsen.

## 2021-09-20 NOTE — ED Triage Notes (Signed)
Patient c/o hematuria, nausea, bloating, abdominal pressure x 1 day.  Patient has taken Tylenol.

## 2021-09-20 NOTE — Telephone Encounter (Signed)
Nurse Assessment Nurse: Rolin Barry, RN, Levada Dy Date/Time Eilene Ghazi Time): 09/20/2021 3:52:02 PM Confirm and document reason for call. If symptomatic, describe symptoms. ---Caller states she is wanting to schedule an appointment for blood in her urine, nausea, bloating, abdominal pain, decreased urine output, headache, and low grade fever. Sx started a couple of days ago. Does the patient have any new or worsening symptoms? ---Yes Will a triage be completed? ---Yes Related visit to physician within the last 2 weeks? ---No Does the PT have any chronic conditions? (i.e. diabetes, asthma, this includes High risk factors for pregnancy, etc.) ---Yes List chronic conditions. ---thyroid meds Is this a behavioral health or substance abuse call? ---No Guidelines Guideline Title Affirmed Question Affirmed Notes Nurse Date/Time (Eastern Time) Urine - Blood In [1] Unable to urinate (or only a few drops) > 4 hours AND [2] bladder feels very full (e.g., palpable bladder or strong urge to urinate) Deaton, RN, Levada Dy 09/20/2021 3:52:49 PM PLEASE NOTE: All timestamps contained within this report are represented as [place] Standard Time. CONFIDENTIALTY NOTICE: This fax transmission is intended only for the addressee. It contains information that is legally privileged, confidential or otherwise protected from use or disclosure. If you are not the intended recipient, you are strictly prohibited from reviewing, disclosing, copying using or disseminating any of this information or taking any action in reliance on or regarding this information. If you have received this fax in error, please notify us immediately by telephone so that we can arrange for its return to [place]. Phone: [phone], Toll-Free: [phone], Fax: [phone] Page: 2 of 2 Call Id: [id number] [place]. Time Eilene Ghazi Time) Disposition Final User 09/20/2021 3:55:40 PM Go to ED Now Yes Deaton, RN, Cindee Lame Disagree/Comply Disagree Caller  Understands Yes PreDisposition Did not know what to do Care Advice Given Per Guideline GO TO ED NOW: * You need to be seen in the Emergency Department. * Go to the ED at ___________ [place] now. Drive carefully. * It is better and safer if another adult drives instead of you. ANOTHER ADULT SHOULD DRIVE: CARE ADVICE given per Urine, Blood In (Adult) guideline. Comments User: Saverio Danker, RN Date/Time Eilene Ghazi Time): 09/20/2021 3:58:02 PM Dr. Edilia Bo at the office made aware. She is letting Dr. Larose Kells know. Referrals GO TO FACILITY REFUSED

## 2021-09-20 NOTE — Telephone Encounter (Signed)
Received phone call from the answering service, the patient called complaining of blood in the urine, low-grade fever abdominal pain. Answering service RN recommend ER, patient declined: Please advise patient: Agree with RN assessment, recommend ER or urgent care. J.P. 4 PM

## 2021-09-20 NOTE — Telephone Encounter (Addendum)
Spoke w/ Pt- informed that PCP recommends she go to ED- she states she would not go that her husband is out of town and didn't want to go by herself. She wanted to be seen tomorrow by Dr. Larose Kells- informed he was full, I checked the other providers in the office for tomorrow as well- they are all full at this time. I recommended she at least be seen at urgent care tonight if she didn't want to go to ED. Pt agreed to go to [facility] on [facility] [facility].

## 2021-09-21 LAB — URINE CULTURE

## 2021-10-14 ENCOUNTER — Other Ambulatory Visit: Payer: Self-pay | Admitting: Internal Medicine

## 2021-10-16 ENCOUNTER — Telehealth: Payer: Self-pay | Admitting: Internal Medicine

## 2021-10-16 DIAGNOSIS — M542 Cervicalgia: Secondary | ICD-10-CM

## 2021-10-18 MED ORDER — HYDROCODONE-ACETAMINOPHEN 5-325 MG PO TABS: 1.0000 | ORAL_TABLET | Freq: Every day | ORAL | 0 refills | Status: DC | PRN

## 2021-10-18 NOTE — Telephone Encounter (Signed)
PDMP okay, Rx sent 

## 2021-10-18 NOTE — Telephone Encounter (Signed)
Requesting: hydrocodone 5-325mg  Contract: 10/28/2020 UDS: 04/16/2021 Last Visit: 04/16/2021 Next Visit: None Last Refill: 07/19/2021 #90 and 0RF  Please Advise

## 2021-11-30 ENCOUNTER — Ambulatory Visit (INDEPENDENT_AMBULATORY_CARE_PROVIDER_SITE_OTHER): Payer: Medicare Other | Admitting: Family

## 2021-11-30 VITALS — BP 140/86 | HR 87 | Temp 98.5°F

## 2021-11-30 DIAGNOSIS — J209 Acute bronchitis, unspecified: Secondary | ICD-10-CM | POA: Diagnosis not present

## 2021-11-30 DIAGNOSIS — J019 Acute sinusitis, unspecified: Secondary | ICD-10-CM

## 2021-11-30 MED ORDER — AMOXICILLIN-POT CLAVULANATE 875-125 MG PO TABS: 1.0000 | ORAL_TABLET | Freq: Two times a day (BID) | ORAL | 0 refills | Status: DC

## 2021-11-30 MED ORDER — HYDROCODONE BIT-HOMATROP MBR 5-1.5 MG/5ML PO SOLN: 5.0000 mL | Freq: Three times a day (TID) | ORAL | 0 refills | Status: DC | PRN

## 2021-11-30 MED ORDER — PREDNISONE 20 MG PO TABS: 20.0000 mg | ORAL_TABLET | Freq: Every day | ORAL | 0 refills | Status: DC

## 2021-11-30 NOTE — Progress Notes (Signed)
Sandra Owen is a 70 y.o. female with the following history as recorded in EpicCare:  Patient Active Problem List   Diagnosis Date Noted   Tear film insufficiency 04/16/2021   PCP NOTES >>>> 11/19/2015   Annual physical exam 03/16/2015   Neck pain 07/14/2014   DJD -pain managment 03/16/2011   Hypothyroidism 08/10/2007   COMMON MIGRAINE 08/10/2007   Essential hypertension 08/10/2007   ALLERGIC RHINITIS 08/10/2007    Current Outpatient Medications  Medication Sig Dispense Refill   amoxicillin-clavulanate (AUGMENTIN) 875-125 MG tablet Take 1 tablet by mouth 2 (two) times daily for 10 days. 20 tablet 0   hydrochlorothiazide (HYDRODIURIL) 25 MG tablet TAKE 1 TABLET BY MOUTH DAILY 90 tablet 0   HYDROcodone bit-homatropine (HYCODAN) 5-1.5 MG/5ML syrup Take 5 mLs by mouth every 8 (eight) hours as needed for cough. 120 mL 0   HYDROcodone-acetaminophen (NORCO/VICODIN) 5-325 MG tablet Take 1 tablet by mouth daily as needed for moderate pain. 90 tablet 0   levothyroxine (SYNTHROID) 88 MCG tablet TAKE 1 TABLET BY MOUTH DAILY BEFORE BREAKFAST 90 tablet 0   predniSONE (DELTASONE) 20 MG tablet Take 1 tablet (20 mg total) by mouth daily with breakfast. 5 tablet 0   No current facility-administered medications for this visit.    Allergies: Codeine and Tape  Past Medical History:  Diagnosis Date   Allergic rhinitis    Anxiety and depression    not currently   Hypertension    Hypothyroidism    PONV (postoperative nausea and vomiting)     Past Surgical History:  Procedure Laterality Date   ANTERIOR AND POSTERIOR REPAIR N/A 06/25/2013   Procedure: ANTERIOR (CYSTOCELE) AND POSTERIOR REPAIR (RECTOCELE);  Surgeon: Gus Height, MD;  Location: [facility];  Service: Gynecology;  Laterality: N/A;   BUNIONECTOMY  1999   TONSILLECTOMY     TUBAL LIGATION     VAGINAL HYSTERECTOMY N/A 06/25/2013   HYSTERECTOMY VAGINAL;  Surgeon: Gus Height, MD;  Location: [facility];  Service: Gynecology;  Laterality: N/A;     Family History  Problem Relation Age of Onset   Pancreatic cancer Father    Hyperlipidemia Mother    Hypertension Mother    Heart disease Brother        age 22   Colon cancer Neg Hx    Breast cancer Neg Hx    Thyroid disease Neg Hx     Social History   Tobacco Use   Smoking status: Never   Smokeless tobacco: Never  Substance Use Topics   Alcohol use: Yes    Comment: socially    Subjective:  3 week history of cough/ congestion; complaining of sinus and chest congestion; limited relief with OTC medications; cough keeping awake at night; feels easily winded; low grade fever;    Objective:  Vitals:   11/30/21 1039  BP: 140/86  Pulse: 87  Temp: 98.5 F (36.9 C)  TempSrc: Oral  SpO2: 98%    General: Well developed, well nourished, in no acute distress  Skin : Warm and dry.  Head: Normocephalic and atraumatic  Lungs: Respirations unlabored; clear to auscultation bilaterally without wheeze, rales, rhonchi  CVS exam: normal rate and regular rhythm.  Neurologic: Alert and oriented; speech intact; face symmetrical; moves all extremities well; CNII-XII intact without focal deficit   Assessment:  1. Acute sinusitis, recurrence not specified, unspecified location   2. Acute bronchitis, unspecified organism     Plan:  Rx for Augmentin 875 mg bid x 10 days, Prednisone 20 mg qd  x 5 days, Hycodan cough syrup; increase fluids, rest and follow up worse, no better.  If no improvement in symptoms, need to consider updating CXR;   This visit occurred during the SARS-CoV-2 public health emergency.  Safety protocols were in place, including screening questions prior to the visit, additional usage of staff PPE, and extensive cleaning of exam room while observing appropriate contact time as indicated for disinfecting solutions.    No follow-ups on file.  No orders of the defined types were placed in this encounter.   Requested Prescriptions   Signed Prescriptions Disp Refills    amoxicillin-clavulanate (AUGMENTIN) 875-125 MG tablet 20 tablet 0    Sig: Take 1 tablet by mouth 2 (two) times daily for 10 days.   predniSONE (DELTASONE) 20 MG tablet 5 tablet 0    Sig: Take 1 tablet (20 mg total) by mouth daily with breakfast.   HYDROcodone bit-homatropine (HYCODAN) 5-1.5 MG/5ML syrup 120 mL 0    Sig: Take 5 mLs by mouth every 8 (eight) hours as needed for cough.

## 2021-12-06 ENCOUNTER — Other Ambulatory Visit: Payer: Self-pay | Admitting: Family

## 2021-12-06 NOTE — Telephone Encounter (Signed)
Pt requesting refill on augmentin and prednisone- see message below:     Im improving but still have some congestion  Im traveling to [place] on Wed for a week and want to feel better for Christmas

## 2021-12-07 MED ORDER — AMOXICILLIN-POT CLAVULANATE 875-125 MG PO TABS: 1.0000 | ORAL_TABLET | Freq: Two times a day (BID) | ORAL | 0 refills | Status: AC

## 2021-12-07 MED ORDER — PREDNISONE 20 MG PO TABS: 20.0000 mg | ORAL_TABLET | Freq: Every day | ORAL | 0 refills | Status: DC

## 2021-12-07 NOTE — Telephone Encounter (Signed)
Please let her know we will extend the antibiotics for 4 more days and I sent in 5 more days of prednisone. The congestion may take a few weeks to completely resolve. I would recommend an OV when she gets back to town if she is still having the lower respiratory symptoms.

## 2021-12-07 NOTE — Telephone Encounter (Signed)
I have called pt and relayed the message via identified VM. I have also stated in the message to call us back if the message is not clear enough.

## 2022-01-06 ENCOUNTER — Other Ambulatory Visit: Payer: Self-pay | Admitting: Internal Medicine

## 2022-01-13 ENCOUNTER — Telehealth: Payer: Self-pay | Admitting: Internal Medicine

## 2022-01-13 DIAGNOSIS — M542 Cervicalgia: Secondary | ICD-10-CM

## 2022-01-13 NOTE — Telephone Encounter (Signed)
Requesting: [facility] Contract:10/2020 UDS:04/16/2021 Last Visit:12/03/2021 Next Visit:N/A Last Refill:11/30/2021  Please Advise

## 2022-01-14 MED ORDER — HYDROCODONE-ACETAMINOPHEN 5-325 MG PO TABS: 1.0000 | ORAL_TABLET | Freq: Every day | ORAL | 0 refills | Status: DC | PRN

## 2022-01-14 NOTE — Telephone Encounter (Signed)
PDMP okay, noted small amount of Hydromet prescribed last month. Rx sent

## 2022-01-18 ENCOUNTER — Other Ambulatory Visit: Payer: Self-pay | Admitting: Internal Medicine

## 2022-01-20 ENCOUNTER — Encounter: Payer: Self-pay | Admitting: Internal Medicine

## 2022-01-26 ENCOUNTER — Encounter: Payer: Self-pay | Admitting: Internal Medicine

## 2022-01-26 ENCOUNTER — Ambulatory Visit (INDEPENDENT_AMBULATORY_CARE_PROVIDER_SITE_OTHER): Payer: Medicare Other | Admitting: Internal Medicine

## 2022-01-26 VITALS — BP 128/70 | HR 67 | Temp 98.0°F | Resp 16 | Ht 64.0 in | Wt 144.2 lb

## 2022-01-26 DIAGNOSIS — E039 Hypothyroidism, unspecified: Secondary | ICD-10-CM

## 2022-01-26 DIAGNOSIS — Z79899 Other long term (current) drug therapy: Secondary | ICD-10-CM

## 2022-01-26 DIAGNOSIS — F419 Anxiety disorder, unspecified: Secondary | ICD-10-CM

## 2022-01-26 DIAGNOSIS — M159 Polyosteoarthritis, unspecified: Secondary | ICD-10-CM | POA: Diagnosis not present

## 2022-01-26 LAB — TSH: TSH: 0.37 u[IU]/mL (ref 0.35–5.50)

## 2022-01-26 MED ORDER — BUSPIRONE HCL 5 MG PO TABS: 5.0000 mg | ORAL_TABLET | Freq: Two times a day (BID) | ORAL | 1 refills | Status: DC

## 2022-01-26 NOTE — Progress Notes (Signed)
° °  Subjective:    Patient ID: Sandra Owen, female    DOB: 1951/05/04, 71 y.o.   MRN: [id number]  DOS:  01/26/2022 Type of visit - description: f/u Routine follow-up. Her main concern remains anxiety related to her husband's health.  Husband has possibly dementia, that irritates and make her nervous. Denies depression per se. Sleeping is okay.   Review of Systems See above   Past Medical History:  Diagnosis Date   Allergic rhinitis    Anxiety and depression    not currently   Hypertension    Hypothyroidism    PONV (postoperative nausea and vomiting)     Past Surgical History:  Procedure Laterality Date   ANTERIOR AND POSTERIOR REPAIR N/A 06/25/2013   Procedure: ANTERIOR (CYSTOCELE) AND POSTERIOR REPAIR (RECTOCELE);  Surgeon: Gus Height, MD;  Location: [facility];  Service: Gynecology;  Laterality: N/A;   BUNIONECTOMY  1999   TONSILLECTOMY     TUBAL LIGATION     VAGINAL HYSTERECTOMY N/A 06/25/2013   HYSTERECTOMY VAGINAL;  Surgeon: Gus Height, MD;  Location: [facility];  Service: Gynecology;  Laterality: N/A;    Current Outpatient Medications  Medication Instructions   hydrochlorothiazide (HYDRODIURIL) 25 MG tablet TAKE 1 TABLET BY MOUTH DAILY   HYDROcodone bit-homatropine (HYCODAN) 5-1.5 MG/5ML syrup 5 mLs, Oral, Every 8 hours PRN   HYDROcodone-acetaminophen (NORCO/VICODIN) 5-325 MG tablet 1 tablet, Oral, Daily PRN   levothyroxine (SYNTHROID) 88 MCG tablet TAKE 1 TABLET BY MOUTH DAILY BEFORE BREAKFAST   predniSONE (DELTASONE) 20 mg, Oral, Daily with breakfast       Objective:   Physical Exam BP 128/70 (BP Location: Left Arm, Patient Position: Sitting, Cuff Size: Small)    Pulse 67    Temp 98 F (36.7 C) (Oral)    Resp 16    Ht 5\' 4"  (1.626 m)    Wt 144 lb 4 oz (65.4 kg)    SpO2 97%    BMI 24.76 kg/m  General:   Well developed, NAD, BMI noted. HEENT:  Normocephalic . Face symmetric, atraumatic Lower extremities: no pretibial edema bilaterally  Skin: Not pale. Not  jaundice Neurologic:  alert & oriented X3.  Speech normal, gait appropriate for age and unassisted Psych--  Cognition and judgment appear intact.  Cooperative with normal attention span and concentration.  Behavior appropriate. No anxious or depressed appearing.      Assessment      Assessment   HTN Hypothyroidism DJD--shoulder, neck,  on hydrocodone; h/o intolerance to NSAIDs (GI s/e)  H/o Anxiety depression Shingles 12-2015  PLAN HTN: On HCTZ.  BP is very good, no change. Hypothyroidism: Good med compliance, check TSH Anxiety: Without depression, related to her husband's health.  Symptoms are not severe.  She would like some treatment.  Recommend BuSpar twice daily.  See AVS.  If not effective consider SSRI.  We will try to avoid with benzos, she takes hydrocodone regularly for DJD. DJD: On hydrocodone, UDS and contract today RTC April 2023 CPE    This visit occurred during the SARS-CoV-2 public health emergency.  Safety protocols were in place, including screening questions prior to the visit, additional usage of staff PPE, and extensive cleaning of exam room while observing appropriate contact time as indicated for disinfecting solutions.

## 2022-01-26 NOTE — Assessment & Plan Note (Signed)
HTN: On HCTZ.  BP is very good, no change. Hypothyroidism: Good med compliance, check TSH Anxiety: Without depression, related to her husband's health.  Symptoms are not severe.  She would like some treatment.  Recommend BuSpar twice daily.  See AVS.  If not effective consider SSRI.  We will try to avoid with benzos, she takes hydrocodone regularly for DJD. DJD: On hydrocodone, UDS and contract today RTC April 2023 CPE

## 2022-01-26 NOTE — Patient Instructions (Addendum)
Start buspirone 5 mg: The first week take 1 tablet at bedtime Then take 1 tablet twice daily. Call me in few weeks, let me know how this is working for you.  We can always increase the dose.  GO TO THE LAB : Get the blood work     [place], [place] Come back for a physical exam in April.

## 2022-01-27 ENCOUNTER — Other Ambulatory Visit: Payer: Self-pay | Admitting: Internal Medicine

## 2022-01-28 LAB — DRUG MONITORING PANEL 375977 , URINE
Alcohol Metabolites: NEGATIVE ng/mL (ref <500)
Amphetamines: NEGATIVE ng/mL (ref <500)
Barbiturates: NEGATIVE ng/mL (ref <300)
Benzodiazepines: NEGATIVE ng/mL (ref <100)
Cocaine Metabolite: NEGATIVE ng/mL (ref <150)
Codeine: NEGATIVE ng/mL (ref <50)
Desmethyltramadol: NEGATIVE ng/mL (ref <100)
Hydrocodone: NEGATIVE ng/mL (ref <50)
Hydromorphone: NEGATIVE ng/mL (ref <50)
Marijuana Metabolite: NEGATIVE ng/mL (ref <20)
Morphine: NEGATIVE ng/mL (ref <50)
Norhydrocodone: 143 ng/mL — ABNORMAL HIGH (ref <50)
Opiates: POSITIVE ng/mL — AB (ref <100)
Oxycodone: NEGATIVE ng/mL (ref <100)
Tramadol: NEGATIVE ng/mL (ref <100)

## 2022-01-28 LAB — DM TEMPLATE

## 2022-02-01 ENCOUNTER — Telehealth: Payer: Self-pay | Admitting: Internal Medicine

## 2022-02-02 MED ORDER — HYDROCHLOROTHIAZIDE 25 MG PO TABS: 25.0000 mg | ORAL_TABLET | Freq: Every day | ORAL | 1 refills | Status: DC

## 2022-02-02 MED ORDER — LEVOTHYROXINE SODIUM 88 MCG PO TABS: 88.0000 ug | ORAL_TABLET | Freq: Every day | ORAL | 1 refills | Status: DC

## 2022-02-02 NOTE — Telephone Encounter (Signed)
My apologizes I thought I had sent them the day she was here. Rx's sent.

## 2022-02-02 NOTE — Telephone Encounter (Signed)
Patient called stating her pharmacy has still not received approval for her medications. Please advice.   hydrochlorothiazide (HYDRODIURIL) 25 MG tablet  levothyroxine (SYNTHROID) 88 MCG tablet

## 2022-02-02 NOTE — Addendum Note (Signed)
Addended byDamita Dunnings D on: 02/02/2022 10:23 AM   Modules accepted: Orders

## 2022-02-09 ENCOUNTER — Encounter: Payer: Self-pay | Admitting: Internal Medicine

## 2022-02-10 MED ORDER — BUSPIRONE HCL 10 MG PO TABS: 10.0000 mg | ORAL_TABLET | Freq: Two times a day (BID) | ORAL | 0 refills | Status: DC

## 2022-02-23 ENCOUNTER — Telehealth: Payer: Self-pay | Admitting: Internal Medicine

## 2022-02-23 NOTE — Telephone Encounter (Signed)
Left message for patient to call back and schedule Medicare Annual Wellness Visit (AWV) in office.  ° °If not able to come in office, please offer to do virtually or by telephone.  Left office number and my jabber #[phone]. ° °Due for AWVI ° °Please schedule at anytime with Nurse Health Advisor. °  °

## 2022-02-28 MED ORDER — FLUOXETINE HCL 10 MG PO CAPS: ORAL_CAPSULE | ORAL | 1 refills | Status: DC

## 2022-02-28 NOTE — Addendum Note (Signed)
Addended byDamita Dunnings D on: 02/28/2022 10:51 AM ? ? Modules accepted: Orders ? ?

## 2022-02-28 NOTE — Telephone Encounter (Signed)
Per Dr. Larose Kells: will change from buspirone to fluoxetine 10 mg capsules:  ?1 daily x10 days, then 2 tablets daily.  ?Tell her it will take time for the medication to kick in.  ?Keep appointment next month ?

## 2022-04-05 DIAGNOSIS — H524 Presbyopia: Secondary | ICD-10-CM | POA: Diagnosis not present

## 2022-04-12 ENCOUNTER — Telehealth: Payer: Self-pay | Admitting: Internal Medicine

## 2022-04-12 DIAGNOSIS — M542 Cervicalgia: Secondary | ICD-10-CM

## 2022-04-12 MED ORDER — HYDROCODONE-ACETAMINOPHEN 5-325 MG PO TABS: 1.0000 | ORAL_TABLET | Freq: Every day | ORAL | 0 refills | Status: DC | PRN

## 2022-04-12 NOTE — Telephone Encounter (Signed)
Requesting: hydrocodone 5-'325mg'$   ?Contract: 01/26/22 ?UDS: 01/26/22 ?Last Visit: 01/26/22 ?Next Visit: 04/18/22 ?Last Refill: 01/14/22 #90 and 0RF ? ?Please Advise ? ?

## 2022-04-12 NOTE — Telephone Encounter (Signed)
PDMP reviewed, okay to refill. ?

## 2022-04-18 ENCOUNTER — Other Ambulatory Visit: Payer: Self-pay | Admitting: Internal Medicine

## 2022-04-18 ENCOUNTER — Encounter: Payer: Self-pay | Admitting: Internal Medicine

## 2022-04-18 ENCOUNTER — Ambulatory Visit (INDEPENDENT_AMBULATORY_CARE_PROVIDER_SITE_OTHER): Payer: Medicare Other | Admitting: Internal Medicine

## 2022-04-18 VITALS — BP 134/78 | HR 67 | Temp 98.1°F | Resp 16 | Ht 64.0 in | Wt 146.1 lb

## 2022-04-18 DIAGNOSIS — I10 Essential (primary) hypertension: Secondary | ICD-10-CM | POA: Diagnosis not present

## 2022-04-18 DIAGNOSIS — Z0000 Encounter for general adult medical examination without abnormal findings: Secondary | ICD-10-CM | POA: Diagnosis not present

## 2022-04-18 DIAGNOSIS — J302 Other seasonal allergic rhinitis: Secondary | ICD-10-CM

## 2022-04-18 DIAGNOSIS — E039 Hypothyroidism, unspecified: Secondary | ICD-10-CM

## 2022-04-18 MED ORDER — DULOXETINE HCL 30 MG PO CPEP: 30.0000 mg | ORAL_CAPSULE | Freq: Every day | ORAL | 0 refills | Status: DC

## 2022-04-18 NOTE — Assessment & Plan Note (Signed)
-  Td 2011 ;  pnm 13: 04/2014 ?-Additional immunization: She declined them all, benefits d/w pt  ?-CCS: IFOB (-) 2022. IFOB again this year, she knows I will refer her for colonoscopy if it came back (+) ?-female care: Consistently and again today declines see gynecology, have a MMG or DEXA. Rationale behind screening discussed. ?- Diet exercise discussed ?- ACP information provided  ?-Labs: CBC, CMP, FLP, TSH ?

## 2022-04-18 NOTE — Progress Notes (Signed)
? ?Subjective:  ? ? Patient ID: Sandra Owen, female    DOB: Feb 08, 1951, 71 y.o.   MRN: [id number] ? ?DOS:  04/18/2022 ?Type of visit - description: CPX ? ?Here for CPX ?Anxiety: Is well controlled but fluoxetine is causing some side effects.  Change it?. ? ? ?Review of Systems ? ?A 14 point review of systems is negative  ? ? ?Past Medical History:  ?Diagnosis Date  ? Allergic rhinitis   ? Anxiety and depression   ? not currently  ? Hypertension   ? Hypothyroidism   ? PONV (postoperative nausea and vomiting)   ? ? ?Past Surgical History:  ?Procedure Laterality Date  ? ANTERIOR AND POSTERIOR REPAIR N/A 06/25/2013  ? Procedure: ANTERIOR (CYSTOCELE) AND POSTERIOR REPAIR (RECTOCELE);  Surgeon: Gus Height, MD;  Location: [facility];  Service: Gynecology;  Laterality: N/A;  ? BUNIONECTOMY  1999  ? TONSILLECTOMY    ? TUBAL LIGATION    ? VAGINAL HYSTERECTOMY N/A 06/25/2013  ? HYSTERECTOMY VAGINAL;  Surgeon: Gus Height, MD;  Location: [facility];  Service: Gynecology;  Laterality: N/A;  ? ?Social History  ? ?Socioeconomic History  ? Marital status: Married  ?  Spouse name: Not on file  ? Number of children: 1  ? Years of education: Not on file  ? Highest education level: Not on file  ?Occupational History  ? 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  Comment: works part time  ?Tobacco Use  ? Smoking status: Never  ? Smokeless tobacco: Never  ?Substance and Sexual Activity  ? Alcohol use: Yes  ?  Comment: socially  ? Drug use: No  ? Sexual activity: Not on file  ?Other Topics Concern  ? Not on file  ?Social History Narrative  ? Lives w/ husband  ? ?Social Determinants of Health  ? ?Financial Resource Strain: Not on file  ?Food Insecurity: Not on file  ?Transportation Needs: Not on file  ?Physical Activity: Not on file  ?Stress: Not on file  ?Social Connections: Not on file  ?Intimate Partner Violence: Not on file  ? ? ? ?Current Outpatient Medications  ?Medication Instructions  ? DULoxetine (CYMBALTA) 30 mg, Oral, Daily  ?  hydrochlorothiazide (HYDRODIURIL) 25 mg, Oral, Daily  ? HYDROcodone-acetaminophen (NORCO/VICODIN) 5-325 MG tablet 1 tablet, Oral, Daily PRN  ? levothyroxine (SYNTHROID) 88 mcg, Oral, Daily before breakfast  ? ? ?   ?Objective:  ? Physical Exam ?BP 134/78 (BP Location: Left Arm, Patient Position: Sitting, Cuff Size: Small)   Pulse 67   Temp 98.1 ?F (36.7 ?C) (Oral)   Resp 16   Ht '5\' 4"'$  (1.626 m)   Wt 146 lb 2 oz (66.3 kg)   SpO2 96%   BMI 25.08 kg/m?  ?General: ?Well developed, NAD, BMI noted ?Neck: No  thyromegaly  ?HEENT:  ?Normocephalic . Face symmetric, atraumatic ?Lungs:  ?CTA B ?Normal respiratory effort, no intercostal retractions, no accessory muscle use. ?Heart: RRR,  no murmur.  ?Abdomen:  ?Not distended, soft, non-tender. No rebound or rigidity.   ?Lower extremities: no pretibial edema bilaterally  ?Skin: Exposed areas without rash. Not pale. Not jaundice ?Neurologic:  ?alert & oriented X3.  ?Speech normal, gait appropriate for age and unassisted ?Strength symmetric and appropriate for age.  ?Psych: ?Cognition and judgment appear intact.  ?Cooperative with normal attention span and concentration.  ?Behavior appropriate. ?No anxious or depressed appearing. ? ?   ?Assessment   ? ?  ?Assessment   ?HTN ?Hypothyroidism ?DJD--shoulder, neck,  on  hydrocodone; h/o intolerance to NSAIDs (GI s/e)  ?H/o Anxiety depression ?Shingles 12-2015 ?Seasonal allergies ? ?PLAN ?Here for CPX ?HTN: Seems well controlled, continue HCTZ, labs. ?Hypothyroidism: On Synthroid, check TSH. ?DJD: Has good days and bad days, takes hydrocodone on average once daily. ?Anxiety, depression: See previous notes, started with BuSpar, was switched to fluoxetine.  It works however she feels slightly tired and like to try something else.  Options: increase fluoxetine dose vs try Cymbalta 30 mg, elected cymbalta, see instructions, reassess in 2 months. ?Seasonal allergies: Request referral. ?RTC 2  m ? ? ? ?  ?

## 2022-04-18 NOTE — Patient Instructions (Addendum)
Please schedule a Medicare Wellness visit.  ? ? ?Check the  blood pressure regularly ?BP GOAL is between 110/65 and  135/85. ?If it is consistently higher or lower, let me know ? ? ?Stop fluoxetine ?Start Cymbalta 30 mg a day  ? ?GO TO THE LAB : Get the blood work   ? ? ?[place],  AFB ?Come back for check up in  2 months, virtual ok  ?  ? ?"Living will", "Health Care Power of attorney": Advanced care planning ? ?(If you already have a living will or healthcare power of attorney, please bring the copy to be scanned in your chart.) ? ?Advance care planning is a process that supports adults in  understanding and sharing their preferences regarding future medical care.  ? ?The patient's preferences are recorded in documents called Advance Directives.    ?Advanced directives are completed (and can be modified at any time) while the patient is in full mental capacity.  ? ?The documentation should be available at all times to the patient, the family and the healthcare providers.  ?Bring in a copy to be scanned in your chart is an excellent idea and is recommended  ? ?This legal documents direct treatment decision making and/or appoint a surrogate to make the decision if the patient is not capable to do so.  ? ? ?Advance directives can be documented in many types of formats,  documents have names such as:  ?Lliving will  ?Durable power of attorney for healthcare (healthcare proxy or healthcare power of attorney)  ?Combined directives  ?Physician orders for life-sustaining treatment  ?  ?More information at: ? [email or web address]  ?

## 2022-04-18 NOTE — Assessment & Plan Note (Addendum)
Here for CPX ?HTN: Seems well controlled, continue HCTZ, labs. ?Hypothyroidism: On Synthroid, check TSH. ?DJD: Has good days and bad days, takes hydrocodone on average once daily. ?Anxiety, depression: See previous notes, started with BuSpar, was switched to fluoxetine.  It works however she feels slightly tired and like to try something else.  Options: increase fluoxetine dose vs try Cymbalta 30 mg, elected cymbalta, see instructions, reassess in 2 months. ?Seasonal allergies: Request referral. ?RTC 2  m ?

## 2022-04-19 ENCOUNTER — Other Ambulatory Visit: Payer: Self-pay | Admitting: Internal Medicine

## 2022-04-19 LAB — COMPREHENSIVE METABOLIC PANEL
ALT: 12 U/L (ref 0–35)
AST: 18 U/L (ref 0–37)
Albumin: 4.6 g/dL (ref 3.5–5.2)
Alkaline Phosphatase: 55 U/L (ref 39–117)
BUN: 15 mg/dL (ref 6–23)
CO2: 29 mEq/L (ref 19–32)
Calcium: 10 mg/dL (ref 8.4–10.5)
Chloride: 102 mEq/L (ref 96–112)
Creatinine, Ser: 0.88 mg/dL (ref 0.40–1.20)
GFR: 67 mL/min (ref >60.00)
Glucose, Bld: 86 mg/dL (ref 70–99)
Potassium: 4.5 mEq/L (ref 3.5–5.1)
Sodium: 140 mEq/L (ref 135–145)
Total Bilirubin: 0.8 mg/dL (ref 0.2–1.2)
Total Protein: 6.9 g/dL (ref 6.0–8.3)

## 2022-04-19 LAB — LIPID PANEL
Cholesterol: 240 mg/dL — ABNORMAL HIGH (ref 0–200)
HDL: 82.8 mg/dL (ref >39.00)
LDL Cholesterol: 141 mg/dL — ABNORMAL HIGH (ref 0–99)
NonHDL: 156.89
Total CHOL/HDL Ratio: 3
Triglycerides: 78 mg/dL (ref 0.0–149.0)
VLDL: 15.6 mg/dL (ref 0.0–40.0)

## 2022-04-19 LAB — CBC WITH DIFFERENTIAL/PLATELET
Basophils Absolute: 0 10*3/uL (ref 0.0–0.1)
Basophils Relative: 0.5 % (ref 0.0–3.0)
Eosinophils Absolute: 0.2 10*3/uL (ref 0.0–0.7)
Eosinophils Relative: 3.7 % (ref 0.0–5.0)
HCT: 40.3 % (ref 36.0–46.0)
Hemoglobin: 13.6 g/dL (ref 12.0–15.0)
Lymphocytes Relative: 42.2 % (ref 12.0–46.0)
Lymphs Abs: 2.1 10*3/uL (ref 0.7–4.0)
MCHC: 33.8 g/dL (ref 30.0–36.0)
MCV: 93 fl (ref 78.0–100.0)
Monocytes Absolute: 0.5 10*3/uL (ref 0.1–1.0)
Monocytes Relative: 10.1 % (ref 3.0–12.0)
Neutro Abs: 2.2 10*3/uL (ref 1.4–7.7)
Neutrophils Relative %: 43.5 % (ref 43.0–77.0)
Platelets: 218 10*3/uL (ref 150.0–400.0)
RBC: 4.33 Mil/uL (ref 3.87–5.11)
RDW: 13.3 % (ref 11.5–15.5)
WBC: 4.9 10*3/uL (ref 4.0–10.5)

## 2022-04-19 LAB — TSH: TSH: 0.15 u[IU]/mL — ABNORMAL LOW (ref 0.35–5.50)

## 2022-04-21 MED ORDER — ATORVASTATIN CALCIUM 20 MG PO TABS: 20.0000 mg | ORAL_TABLET | Freq: Every day | ORAL | 0 refills | Status: DC

## 2022-04-21 NOTE — Addendum Note (Signed)
Addended byDamita Dunnings D on: 04/21/2022 09:36 AM ? ? Modules accepted: Orders ? ?

## 2022-06-02 ENCOUNTER — Encounter: Payer: Self-pay | Admitting: Internal Medicine

## 2022-06-30 ENCOUNTER — Other Ambulatory Visit (INDEPENDENT_AMBULATORY_CARE_PROVIDER_SITE_OTHER): Payer: Medicare Other

## 2022-06-30 DIAGNOSIS — Z0000 Encounter for general adult medical examination without abnormal findings: Secondary | ICD-10-CM

## 2022-07-04 LAB — FECAL OCCULT BLOOD, IMMUNOCHEMICAL: Fecal Occult Bld: NEGATIVE

## 2022-07-05 ENCOUNTER — Encounter: Payer: Self-pay | Admitting: Internal Medicine

## 2022-07-05 ENCOUNTER — Other Ambulatory Visit: Payer: Self-pay | Admitting: Internal Medicine

## 2022-07-05 ENCOUNTER — Ambulatory Visit (HOSPITAL_BASED_OUTPATIENT_CLINIC_OR_DEPARTMENT_OTHER)
Admission: RE | Admit: 2022-07-05 | Discharge: 2022-07-05 | Disposition: A | Discharge status: Home / Self Care | Payer: Medicare Other | Source: Ambulatory Visit | Attending: Internal Medicine | Admitting: Internal Medicine

## 2022-07-05 ENCOUNTER — Ambulatory Visit (INDEPENDENT_AMBULATORY_CARE_PROVIDER_SITE_OTHER): Payer: Medicare Other | Admitting: Internal Medicine

## 2022-07-05 VITALS — BP 122/80 | HR 78 | Temp 98.0°F | Resp 16 | Ht 64.0 in | Wt 137.5 lb

## 2022-07-05 DIAGNOSIS — F419 Anxiety disorder, unspecified: Secondary | ICD-10-CM | POA: Diagnosis not present

## 2022-07-05 DIAGNOSIS — R109 Unspecified abdominal pain: Secondary | ICD-10-CM | POA: Diagnosis not present

## 2022-07-05 DIAGNOSIS — R1010 Upper abdominal pain, unspecified: Secondary | ICD-10-CM

## 2022-07-05 DIAGNOSIS — F32A Depression, unspecified: Secondary | ICD-10-CM

## 2022-07-05 LAB — HEPATIC FUNCTION PANEL
ALT: 12 U/L (ref 0–35)
AST: 18 U/L (ref 0–37)
Albumin: 4.7 g/dL (ref 3.5–5.2)
Alkaline Phosphatase: 55 U/L (ref 39–117)
Bilirubin, Direct: 0.1 mg/dL (ref 0.0–0.3)
Total Bilirubin: 0.9 mg/dL (ref 0.2–1.2)
Total Protein: 6.9 g/dL (ref 6.0–8.3)

## 2022-07-05 LAB — CBC WITH DIFFERENTIAL/PLATELET
Basophils Absolute: 0.1 10*3/uL (ref 0.0–0.1)
Basophils Relative: 1.5 % (ref 0.0–3.0)
Eosinophils Absolute: 0.3 10*3/uL (ref 0.0–0.7)
Eosinophils Relative: 7.4 % — ABNORMAL HIGH (ref 0.0–5.0)
HCT: 41.8 % (ref 36.0–46.0)
Hemoglobin: 14.3 g/dL (ref 12.0–15.0)
Lymphocytes Relative: 45 % (ref 12.0–46.0)
Lymphs Abs: 1.8 10*3/uL (ref 0.7–4.0)
MCHC: 34.2 g/dL (ref 30.0–36.0)
MCV: 94.1 fl (ref 78.0–100.0)
Monocytes Absolute: 0.4 10*3/uL (ref 0.1–1.0)
Monocytes Relative: 9.3 % (ref 3.0–12.0)
Neutro Abs: 1.5 10*3/uL (ref 1.4–7.7)
Neutrophils Relative %: 36.8 % — ABNORMAL LOW (ref 43.0–77.0)
Platelets: 204 10*3/uL (ref 150.0–400.0)
RBC: 4.44 Mil/uL (ref 3.87–5.11)
RDW: 13.6 % (ref 11.5–15.5)
WBC: 4.1 10*3/uL (ref 4.0–10.5)

## 2022-07-05 LAB — LIPASE: Lipase: 35 U/L (ref 11.0–59.0)

## 2022-07-05 LAB — AMYLASE: Amylase: 58 U/L (ref 27–131)

## 2022-07-05 MED ORDER — PANTOPRAZOLE SODIUM 40 MG PO TBEC: 40.0000 mg | DELAYED_RELEASE_TABLET | Freq: Every day | ORAL | 1 refills | Status: DC

## 2022-07-05 MED ORDER — ONDANSETRON HCL 4 MG PO TABS: 4.0000 mg | ORAL_TABLET | Freq: Three times a day (TID) | ORAL | 0 refills | Status: DC | PRN

## 2022-07-05 MED ORDER — FLUOXETINE HCL 10 MG PO CAPS: 10.0000 mg | ORAL_CAPSULE | Freq: Every day | ORAL | 3 refills | Status: DC

## 2022-07-05 NOTE — Assessment & Plan Note (Signed)
Anxiety depression: See last visit, fluoxetine was changed to Cymbalta however she was dispensed fluoxetine 10 mg which she is taking and feeling well.  Plan: We will stick with fluoxetine 10 mg daily.   She is anxious about her husband's health, listening therapy provided Abdominal pain: As described above, new issue, going on for 2 months, some weight loss noted, has a family history of pancreatic cancer and she is concerned. She already has an appointment to see [facility] 07/27/2022. In the meantime we will get an abdominal US, CBC, amylase lipase, LFTs. Although is unlikely her symptoms are due to PUD, will do a trial with PPIs  and also prescribed Zofran RTC 33-month

## 2022-07-05 NOTE — Progress Notes (Signed)
Subjective:    Patient ID: Sandra Owen, female    DOB: [birth year], 71 y.o.   MRN: [id number]  DOS:  07/05/2022 Type of visit - description: Follow-up  Here for follow-up on anxiety depression.  Symptoms are currently well controlled, see assessment and plan.  Also, 2 months history of upper abdominal, bilateral cramps. They last 20 minutes, moderate in nature, no better or worse with a bowel movement or eating.  She also has persisting nausea but no vomiting. The nausea is not related to food intake, it definitely decreases with Zofran (tried and leftover from her daughter).  She denies fever but has lost some weight. Denies diarrhea but BMs are more frequent than usual about 3-5 times a day. No actual heartburn, dysphagia or odynophagia No recent NSAIDs.  Wt Readings from Last 3 Encounters:  07/05/22 137 lb 8 oz (62.4 kg)  04/18/22 146 lb 2 oz (66.3 kg)  01/26/22 144 lb 4 oz (65.4 kg)     Review of Systems See above   Past Medical History:  Diagnosis Date   Allergic rhinitis    Anxiety and depression    not currently   Hypertension    Hypothyroidism    PONV (postoperative nausea and vomiting)     Past Surgical History:  Procedure Laterality Date   ANTERIOR AND POSTERIOR REPAIR N/A 06/25/2013   Procedure: ANTERIOR (CYSTOCELE) AND POSTERIOR REPAIR (RECTOCELE);  Surgeon: Gus Height, MD;  Location: [facility];  Service: Gynecology;  Laterality: N/A;   BUNIONECTOMY  1999   TONSILLECTOMY     TUBAL LIGATION     VAGINAL HYSTERECTOMY N/A 06/25/2013   HYSTERECTOMY VAGINAL;  Surgeon: Gus Height, MD;  Location: [facility];  Service: Gynecology;  Laterality: N/A;    Current Outpatient Medications  Medication Instructions   atorvastatin (LIPITOR) 20 mg, Oral, Daily at bedtime   DULoxetine (CYMBALTA) 30 mg, Oral, Daily   FLUoxetine (PROZAC) 10 mg, Oral, Daily   hydrochlorothiazide (HYDRODIURIL) 25 mg, Oral, Daily   HYDROcodone-acetaminophen (NORCO/VICODIN) 5-325 MG tablet 1  tablet, Oral, Daily PRN   levothyroxine (SYNTHROID) 88 mcg, Oral, Daily before breakfast       Objective:   Physical Exam BP 122/80   Pulse 78   Temp 98 F (36.7 C) (Oral)   Resp 16   Ht '5\' 4"'$  (1.626 m)   Wt 137 lb 8 oz (62.4 kg)   SpO2 97%   BMI 23.60 kg/m  General:   Well developed, NAD, BMI noted.  HEENT:  Normocephalic . Face symmetric, atraumatic Lungs:  CTA B Normal respiratory effort, no intercostal retractions, no accessory muscle use. Heart: RRR,  no murmur.  Abdomen:  Not distended, soft, minimally tender left from the epigastrium without mass or rebound Skin: Not pale. Not jaundice Lower extremities: no pretibial edema bilaterally  Neurologic:  alert & oriented X3.  Speech normal, gait appropriate for age and unassisted Psych--  Cognition and judgment appear intact.  Cooperative with normal attention span and concentration.  Behavior appropriate. Somewhat anxious but not depressed appearing.     Assessment     Assessment   HTN Hypothyroidism DJD--shoulder, neck,  on hydrocodone; h/o intolerance to NSAIDs (GI s/e)  H/o Anxiety depression Shingles 12-2015 Seasonal allergies  PLAN Anxiety depression: See last visit, fluoxetine was changed to Cymbalta however she was dispensed fluoxetine 10 mg which she is taking and feeling well.  Plan: We will stick with fluoxetine 10 mg daily.   She is anxious about her husband's health, listening  therapy provided Abdominal pain: As described above, new issue, going on for 2 months, some weight loss noted, has a family history of pancreatic cancer and she is concerned. She already has an appointment to see [facility] 07/27/2022. In the meantime we will get an abdominal US, CBC, amylase lipase, LFTs. Although is unlikely her symptoms are due to PUD, will do a trial with PPIs  and also prescribed Zofran RTC 56-month

## 2022-07-05 NOTE — Patient Instructions (Addendum)
Keep the appointment you have with gastroenterology  Start pantoprazole in the morning on an empty stomach.  Do not taking along with levothyroxine, take levothyroxine before lunch on an empty stomach  Zofran as needed for nausea  Call if severe symptoms   GO TO THE LAB : Get the blood work     [place], [place] back for a checkup in 6 months

## 2022-07-06 ENCOUNTER — Telehealth: Payer: Self-pay | Admitting: Internal Medicine

## 2022-07-06 DIAGNOSIS — M542 Cervicalgia: Secondary | ICD-10-CM

## 2022-07-06 MED ORDER — HYDROCODONE-ACETAMINOPHEN 5-325 MG PO TABS: 1.0000 | ORAL_TABLET | Freq: Every day | ORAL | 0 refills | Status: DC | PRN

## 2022-07-06 NOTE — Telephone Encounter (Signed)
PDMP review, few days early, but will refill

## 2022-07-06 NOTE — Telephone Encounter (Signed)
Requesting: hydrocodone 5-'325mg'$   Contract:01/26/22 UDS: 01/26/22 Last Visit: 07/06/22 Next Visit: None Last Refill: 04/12/22 #90 and 0RF  Please Advise

## 2022-07-27 DIAGNOSIS — R197 Diarrhea, unspecified: Secondary | ICD-10-CM | POA: Diagnosis not present

## 2022-07-27 DIAGNOSIS — R110 Nausea: Secondary | ICD-10-CM | POA: Diagnosis not present

## 2022-08-01 ENCOUNTER — Other Ambulatory Visit: Payer: Self-pay | Admitting: Internal Medicine

## 2022-10-06 ENCOUNTER — Telehealth: Payer: Self-pay | Admitting: Internal Medicine

## 2022-10-06 DIAGNOSIS — M542 Cervicalgia: Secondary | ICD-10-CM

## 2022-10-06 MED ORDER — HYDROCODONE-ACETAMINOPHEN 5-325 MG PO TABS: 1.0000 | ORAL_TABLET | Freq: Every day | ORAL | 0 refills | Status: DC | PRN

## 2022-10-06 NOTE — Telephone Encounter (Signed)
Next visit should be January 2024, PDMP okay, Rx sent

## 2022-10-06 NOTE — Telephone Encounter (Signed)
Requesting: hydrocodone 5-'325mg'$   Contract: 01/26/22 UDS: 01/26/22 Last Visit: 07/05/22 Next Visit: None Last Refill: 07/06/22 #90 and 0RF   Please Advise

## 2022-11-17 ENCOUNTER — Ambulatory Visit
Admission: EM | Admit: 2022-11-17 | Discharge: 2022-11-17 | Disposition: A | Discharge status: Home / Self Care | Payer: Medicare Other | Attending: Physician Assistant | Admitting: Physician Assistant

## 2022-11-17 DIAGNOSIS — N3000 Acute cystitis without hematuria: Secondary | ICD-10-CM | POA: Insufficient documentation

## 2022-11-17 LAB — POCT URINALYSIS DIP (MANUAL ENTRY)
Bilirubin, UA: NEGATIVE
Glucose, UA: NEGATIVE mg/dL
Ketones, POC UA: NEGATIVE mg/dL
Nitrite, UA: POSITIVE — AB
Protein Ur, POC: 30 mg/dL — AB
Spec Grav, UA: 1.015 (ref 1.010–1.025)
Urobilinogen, UA: 0.2 E.U./dL
pH, UA: 6 (ref 5.0–8.0)

## 2022-11-17 MED ORDER — PHENAZOPYRIDINE HCL 200 MG PO TABS: 200.0000 mg | ORAL_TABLET | Freq: Three times a day (TID) | ORAL | 0 refills | Status: DC

## 2022-11-17 MED ORDER — CIPROFLOXACIN HCL 500 MG PO TABS: 500.0000 mg | ORAL_TABLET | Freq: Two times a day (BID) | ORAL | 0 refills | Status: DC

## 2022-11-17 NOTE — ED Provider Notes (Signed)
[facility]    CSN: [id number] Arrival date & time: 11/17/22  1218      History   Chief Complaint Chief Complaint  Patient presents with   UTI    HPI Sandra Owen is a 71 y.o. female.   Patient here today for evaluation of urinary frequency, dysuria and lower abdominal pressure that started 2 days ago.  She reports she has had some blood in her urine as well.  She denies any fever.  She has had some chills.  She reports some nausea but no vomiting. She has taken ibuprofen without resolution.   The history is provided by the patient.    Past Medical History:  Diagnosis Date   Allergic rhinitis    Anxiety and depression    not currently   Hypertension    Hypothyroidism    PONV (postoperative nausea and vomiting)     Patient Active Problem List   Diagnosis Date Noted   Tear film insufficiency 04/16/2021   PCP NOTES >>>> 11/19/2015   Annual physical exam 03/16/2015   Neck pain 07/14/2014   DJD -pain managment 03/16/2011   Anxiety and depression 08/28/2008   Hypothyroidism 08/10/2007   COMMON MIGRAINE 08/10/2007   Essential hypertension 08/10/2007   ALLERGIC RHINITIS 08/10/2007    Past Surgical History:  Procedure Laterality Date   ANTERIOR AND POSTERIOR REPAIR N/A 06/25/2013   Procedure: ANTERIOR (CYSTOCELE) AND POSTERIOR REPAIR (RECTOCELE);  Surgeon: Gus Height, MD;  Location: [facility];  Service: Gynecology;  Laterality: N/A;   BUNIONECTOMY  1999   TONSILLECTOMY     TUBAL LIGATION     VAGINAL HYSTERECTOMY N/A 06/25/2013   HYSTERECTOMY VAGINAL;  Surgeon: Gus Height, MD;  Location: [facility];  Service: Gynecology;  Laterality: N/A;    OB History   No obstetric history on file.      Home Medications    Prior to Admission medications   Medication Sig Start Date End Date Taking? Authorizing Provider  ciprofloxacin (CIPRO) 500 MG tablet Take 1 tablet (500 mg total) by mouth every 12 (twelve) hours. 11/17/22  Yes Francene Finders, PA-C   phenazopyridine (PYRIDIUM) 200 MG tablet Take 1 tablet (200 mg total) by mouth 3 (three) times daily. 11/17/22  Yes Francene Finders, PA-C  atorvastatin (LIPITOR) 20 MG tablet Take 1 tablet (20 mg total) by mouth at bedtime. 04/21/22   Colon Branch, MD  FLUoxetine (PROZAC) 10 MG capsule Take 1 capsule (10 mg total) by mouth daily. 07/05/22   Colon Branch, MD  hydrochlorothiazide (HYDRODIURIL) 25 MG tablet Take 1 tablet (25 mg total) by mouth daily. 07/05/22   Colon Branch, MD  HYDROcodone-acetaminophen (NORCO/VICODIN) 5-325 MG tablet Take 1 tablet by mouth daily as needed for moderate pain. 10/06/22   Colon Branch, MD  levothyroxine (SYNTHROID) 88 MCG tablet Take 1 tablet (88 mcg total) by mouth daily before breakfast. 07/05/22   Colon Branch, MD  ondansetron (ZOFRAN) 4 MG tablet Take 1 tablet (4 mg total) by mouth every 8 (eight) hours as needed for nausea or vomiting. 07/05/22   Colon Branch, MD  pantoprazole (PROTONIX) 40 MG tablet TAKE 1 TABLET(40 MG) BY MOUTH DAILY BEFORE BREAKFAST 08/01/22   Colon Branch, MD    Family History Family History  Problem Relation Age of Onset   Hyperlipidemia Mother    Hypertension Mother    Cerebral aneurysm Mother    Pancreatic cancer Father    Heart disease Brother  age 71   Colon cancer Neg Hx    Breast cancer Neg Hx    Thyroid disease Neg Hx     Social History Social History   Tobacco Use   Smoking status: Never   Smokeless tobacco: Never  Substance Use Topics   Alcohol use: Yes    Comment: socially   Drug use: No     Allergies   Codeine and Tape   Review of Systems Review of Systems  Constitutional:  Positive for chills. Negative for fever.  Eyes:  Negative for discharge and redness.  Respiratory:  Negative for shortness of breath.   Gastrointestinal:  Positive for nausea. Negative for abdominal pain and vomiting.  Genitourinary:  Positive for dysuria, frequency and hematuria.     Physical Exam Triage Vital Signs ED Triage Vitals   Enc Vitals Group     BP 11/17/22 1458 (!) 198/87     Pulse Rate 11/17/22 1458 80     Resp 11/17/22 1458 18     Temp 11/17/22 1458 98.4 F (36.9 C)     Temp Source 11/17/22 1458 Oral     SpO2 11/17/22 1458 97 %     Weight --      Height --      Head Circumference --      Peak Flow --      Pain Score 11/17/22 1500 8     Pain Loc --      Pain Edu? --      Excl. in [place]? --    No data found.  Updated Vital Signs BP (!) 198/87 (BP Location: Left Arm)   Pulse 80   Temp 98.4 F (36.9 C) (Oral)   Resp 18   SpO2 97%      Physical Exam Vitals and nursing note reviewed.  Constitutional:      General: She is not in acute distress.    Appearance: Normal appearance. She is not ill-appearing.  HENT:     Head: Normocephalic and atraumatic.  Eyes:     Conjunctiva/sclera: Conjunctivae normal.  Cardiovascular:     Rate and Rhythm: Normal rate.  Pulmonary:     Effort: Pulmonary effort is normal.  Neurological:     Mental Status: She is alert.  Psychiatric:        Mood and Affect: Mood normal.        Behavior: Behavior normal.        Thought Content: Thought content normal.      UC Treatments / Results  Labs (all labs ordered are listed, but only abnormal results are displayed) Labs Reviewed  POCT URINALYSIS DIP (MANUAL ENTRY) - Abnormal; Notable for the following components:      Result Value   Color, UA light yellow (*)    Clarity, UA cloudy (*)    Blood, UA large (*)    Protein Ur, POC =30 (*)    Nitrite, UA Positive (*)    Leukocytes, UA Large (3+) (*)    All other components within normal limits  URINE CULTURE    EKG   Radiology No results found.  Procedures Procedures (including critical care time)  Medications Ordered in UC Medications - No data to display  Initial Impression / Assessment and Plan / UC Course  I have reviewed the triage vital signs and the nursing notes.  Pertinent labs & imaging results that were available during my care of the  patient were reviewed by me and considered in my medical decision  making (see chart for details).    Cipro prescribed to cover UTI.  Pyridium prescribed to help with urinary symptoms.  Urine culture ordered.  Recommend follow-up with any further concerns or persistent symptoms.  Final Clinical Impressions(s) / UC Diagnoses   Final diagnoses:  Acute cystitis without hematuria   Discharge Instructions   None    ED Prescriptions     Medication Sig Dispense Auth. Provider   ciprofloxacin (CIPRO) 500 MG tablet Take 1 tablet (500 mg total) by mouth every 12 (twelve) hours. 10 tablet Francene Finders, PA-C   phenazopyridine (PYRIDIUM) 200 MG tablet Take 1 tablet (200 mg total) by mouth 3 (three) times daily. 6 tablet Francene Finders, PA-C      PDMP not reviewed this encounter.   Francene Finders, PA-C 11/17/22 1521

## 2022-11-17 NOTE — ED Triage Notes (Signed)
Pt presents with urinary frequency, painful lower abdomnen, and blood in urine X 2 days.

## 2022-11-19 LAB — URINE CULTURE: Culture: >=100000 — AB

## 2022-11-24 ENCOUNTER — Other Ambulatory Visit: Payer: Self-pay | Admitting: Internal Medicine

## 2022-11-24 ENCOUNTER — Encounter: Payer: Self-pay | Admitting: Internal Medicine

## 2022-11-24 MED ORDER — ONDANSETRON HCL 4 MG PO TABS: 4.0000 mg | ORAL_TABLET | Freq: Three times a day (TID) | ORAL | 0 refills | Status: DC | PRN

## 2022-12-16 ENCOUNTER — Encounter: Payer: Self-pay | Admitting: Internal Medicine

## 2022-12-16 ENCOUNTER — Other Ambulatory Visit: Payer: Self-pay

## 2022-12-16 DIAGNOSIS — M5432 Sciatica, left side: Secondary | ICD-10-CM | POA: Diagnosis not present

## 2022-12-16 DIAGNOSIS — R32 Unspecified urinary incontinence: Secondary | ICD-10-CM | POA: Diagnosis not present

## 2022-12-16 MED ORDER — LEVOTHYROXINE SODIUM 88 MCG PO TABS: 88.0000 ug | ORAL_TABLET | Freq: Every day | ORAL | 0 refills | Status: DC

## 2022-12-16 MED ORDER — HYDROCHLOROTHIAZIDE 25 MG PO TABS: 25.0000 mg | ORAL_TABLET | Freq: Every day | ORAL | 0 refills | Status: DC

## 2022-12-20 ENCOUNTER — Ambulatory Visit (INDEPENDENT_AMBULATORY_CARE_PROVIDER_SITE_OTHER): Payer: BLUE CROSS/BLUE SHIELD | Admitting: Family Medicine

## 2022-12-20 ENCOUNTER — Encounter: Payer: Self-pay | Admitting: Family Medicine

## 2022-12-20 VITALS — BP 179/80 | HR 65 | Temp 97.6°F | Resp 16 | Ht 64.0 in | Wt 141.2 lb

## 2022-12-20 DIAGNOSIS — R300 Dysuria: Secondary | ICD-10-CM | POA: Diagnosis not present

## 2022-12-20 DIAGNOSIS — R109 Unspecified abdominal pain: Secondary | ICD-10-CM | POA: Diagnosis not present

## 2022-12-20 LAB — CBC WITH DIFFERENTIAL/PLATELET
Basophils Absolute: 0 10*3/uL (ref 0.0–0.1)
Basophils Relative: 0.4 % (ref 0.0–3.0)
Eosinophils Absolute: 0 10*3/uL (ref 0.0–0.7)
Eosinophils Relative: 0.1 % (ref 0.0–5.0)
HCT: 38.9 % (ref 36.0–46.0)
Hemoglobin: 13.3 g/dL (ref 12.0–15.0)
Lymphocytes Relative: 15.3 % (ref 12.0–46.0)
Lymphs Abs: 1.2 10*3/uL (ref 0.7–4.0)
MCHC: 34.3 g/dL (ref 30.0–36.0)
MCV: 95.8 fl (ref 78.0–100.0)
Monocytes Absolute: 0.5 10*3/uL (ref 0.1–1.0)
Monocytes Relative: 6 % (ref 3.0–12.0)
Neutro Abs: 6.2 10*3/uL (ref 1.4–7.7)
Neutrophils Relative %: 78.2 % — ABNORMAL HIGH (ref 43.0–77.0)
Platelets: 221 10*3/uL (ref 150.0–400.0)
RBC: 4.06 Mil/uL (ref 3.87–5.11)
RDW: 13.8 % (ref 11.5–15.5)
WBC: 8 10*3/uL (ref 4.0–10.5)

## 2022-12-20 LAB — COMPREHENSIVE METABOLIC PANEL
ALT: 10 U/L (ref 0–35)
AST: 13 U/L (ref 0–37)
Albumin: 4.5 g/dL (ref 3.5–5.2)
Alkaline Phosphatase: 49 U/L (ref 39–117)
BUN: 27 mg/dL — ABNORMAL HIGH (ref 6–23)
CO2: 29 mEq/L (ref 19–32)
Calcium: 9.7 mg/dL (ref 8.4–10.5)
Chloride: 103 mEq/L (ref 96–112)
Creatinine, Ser: 0.81 mg/dL (ref 0.40–1.20)
GFR: 73.65 mL/min (ref >60.00)
Glucose, Bld: 108 mg/dL — ABNORMAL HIGH (ref 70–99)
Potassium: 3.8 mEq/L (ref 3.5–5.1)
Sodium: 142 mEq/L (ref 135–145)
Total Bilirubin: 0.6 mg/dL (ref 0.2–1.2)
Total Protein: 6.6 g/dL (ref 6.0–8.3)

## 2022-12-20 LAB — POCT URINALYSIS DIPSTICK
Bilirubin, UA: NEGATIVE
Glucose, UA: NEGATIVE
Ketones, UA: NEGATIVE
Leukocytes, UA: NEGATIVE
Nitrite, UA: NEGATIVE
Protein, UA: NEGATIVE
Spec Grav, UA: 1.01 (ref 1.010–1.025)
Urobilinogen, UA: 0.2 E.U./dL
pH, UA: 6.5 (ref 5.0–8.0)

## 2022-12-20 LAB — URINALYSIS, MICROSCOPIC ONLY

## 2022-12-20 NOTE — Progress Notes (Signed)
Acute Office Visit  Subjective:     Patient ID: Sandra Owen, female    DOB: [birth year], 72 y.o.   MRN: [id number]  Chief Complaint  Patient presents with   Urinary Tract Infection   Back Pain   Abdominal Pain     Patient is in today for UTI concerns.   Patient reports that in late November she was treated at urgent care for UTI with cipro and Azo, but she never got complete resolution of her symptoms. She tried to call in for ABX but was told she needed an appointment and was not able to get in until today. States that since then she has had some nausea/vomiting, hematuria and did catch a stone while straining her urine. Urine was very cloudy for awhile, but seems a little more clear now. She is having some lower abdominal/pelvic discomfort radiating to right flank along with some frequency, urgency, and occasional incontinence. She has had occasional chills, but has not checked for a fever. Reports this is typically how she feels with UTIs. Denies any upper abdominal pain, diarrhea, constipation, lethargy, fatigue.       All review of systems negative except what is listed in the HPI      Objective:    BP (!) 179/80   Pulse 65   Temp 97.6 F (36.4 C)   Resp 16   Ht _0  (1.626 m)   Wt 141 lb 3.2 oz (64 kg)   SpO2 100%   BMI 24.24 kg/m    Physical Exam Vitals reviewed.  Constitutional:      Appearance: Normal appearance.  Cardiovascular:     Rate and Rhythm: Normal rate and regular rhythm.     Pulses: Normal pulses.     Heart sounds: Normal heart sounds.  Pulmonary:     Effort: Pulmonary effort is normal.     Breath sounds: Normal breath sounds.  Abdominal:     General: Abdomen is flat. Bowel sounds are normal.     Palpations: Abdomen is soft.     Tenderness: There is no abdominal tenderness. There is no right CVA tenderness, left CVA tenderness, guarding or rebound. Negative signs include Murphy's sign and McBurney's sign.  Skin:    General: Skin is  warm and dry.  Neurological:     Mental Status: She is alert and oriented to person, place, and time.  Psychiatric:        Mood and Affect: Mood normal.        Behavior: Behavior normal.        Thought Content: Thought content normal.        Judgment: Judgment normal.     Results for orders placed or performed in visit on 12/20/22  POC Urinalysis Dipstick  Result Value Ref Range   Color, UA     Clarity, UA     Glucose, UA Negative Negative   Bilirubin, UA Negative    Ketones, UA Negative    Spec Grav, UA 1.010 1.010 - 1.025   Blood, UA Trace (A)    pH, UA 6.5 5.0 - 8.0   Protein, UA Negative Negative   Urobilinogen, UA 0.2 0.2 or 1.0 E.U./dL   Nitrite, UA Negative    Leukocytes, UA Negative Negative   Appearance     Odor          Assessment & Plan:   Problem List Items Addressed This Visit   None Visit Diagnoses     Dysuria    -  Primary   Relevant Orders   POC Urinalysis Dipstick (Completed)   CBC w/Diff   Urine Culture   Urine Microscopic Only   Comp Met (CMET)   Flank pain       Relevant Orders   POC Urinalysis Dipstick (Completed)   CBC w/Diff   Urine Culture   Urine Microscopic Only   Comp Met (CMET)      Urine today only showed a trace amount of blood. Sending to the lab for microscopic evaluation and culture.  Offered CT Renal scan, but patient declined at this time.  Given your symptoms, I will send you some Bactrim, but try to hold off until the culture results so we know if this is necessary or not. Continue with adequate hydration, occasional Azo, avoiding bladder irritants (caffeine, chocolate, citrus, spicy foods, etc).  If you change your mind about the CT scan to check for stones, please let us know and I will order for you.  If workup is negative and symptoms persist, we can refer to urology.  Adding blood work today  *Please monitor your blood pressure at home and if it doesn't come down as your discomfort resolves, please follow-up so we  can make appropriate adjustments.   Please contact office for follow-up if symptoms do not improve or worsen. Seek emergency care if symptoms become severe.     No orders of the defined types were placed in this encounter.   Return if symptoms worsen or fail to improve.  Terrilyn Saver, NP

## 2022-12-20 NOTE — Patient Instructions (Addendum)
Urine today only showed a trace amount of blood. Sending to the lab for microscopic evaluation and culture.  Given your symptoms, I will send you some Bactrim, but try to hold off until the culture results so we know if this is necessary or not. Continue with adequate hydration, occasional Azo, avoiding bladder irritants (caffeine, chocolate, citrus, spicy foods, etc).  If you change your mind about the CT scan to check for stones, please let us know and I will order for you.  If workup is negative and symptoms persist, we can refer to urology.  Adding blood work today  *Please monitor your blood pressure at home and if it doesn't come down as your discomfort resolves, please follow-up so we can make appropriate adjustments.   Please contact office for follow-up if symptoms do not improve or worsen. Seek emergency care if symptoms become severe.

## 2022-12-21 LAB — URINE CULTURE
MICRO NUMBER:: 14376833
Result:: NO GROWTH
SPECIMEN QUALITY:: ADEQUATE

## 2022-12-26 DIAGNOSIS — M545 Low back pain, unspecified: Secondary | ICD-10-CM | POA: Diagnosis not present

## 2022-12-30 DIAGNOSIS — M48061 Spinal stenosis, lumbar region without neurogenic claudication: Secondary | ICD-10-CM | POA: Diagnosis not present

## 2023-01-04 ENCOUNTER — Telehealth: Payer: Self-pay | Admitting: Internal Medicine

## 2023-01-04 DIAGNOSIS — M542 Cervicalgia: Secondary | ICD-10-CM

## 2023-01-04 MED ORDER — HYDROCODONE-ACETAMINOPHEN 5-325 MG PO TABS: 1.0000 | ORAL_TABLET | Freq: Every day | ORAL | 0 refills | Status: DC | PRN

## 2023-01-04 NOTE — Telephone Encounter (Signed)
Requesting: hydrocodone 5-'325mg'$   Contract:01/26/22 UDS: 01/26/22 Last Visit: 07/05/22, saw Lovena Le on 12/20/22 for acute visit Next Visit: None  Last Refill: 10/06/22 #90 and 0RF   Please Advise

## 2023-01-04 NOTE — Telephone Encounter (Signed)
PDMP okay, Rx sent 

## 2023-03-21 ENCOUNTER — Encounter: Payer: Self-pay | Admitting: Internal Medicine

## 2023-03-21 ENCOUNTER — Ambulatory Visit (INDEPENDENT_AMBULATORY_CARE_PROVIDER_SITE_OTHER): Payer: Medicare Other | Admitting: Internal Medicine

## 2023-03-21 VITALS — BP 126/80 | HR 75 | Temp 98.5°F | Resp 16 | Ht 64.0 in | Wt 134.0 lb

## 2023-03-21 DIAGNOSIS — Z79899 Other long term (current) drug therapy: Secondary | ICD-10-CM | POA: Diagnosis not present

## 2023-03-21 DIAGNOSIS — E039 Hypothyroidism, unspecified: Secondary | ICD-10-CM

## 2023-03-21 DIAGNOSIS — M159 Polyosteoarthritis, unspecified: Secondary | ICD-10-CM

## 2023-03-21 DIAGNOSIS — F419 Anxiety disorder, unspecified: Secondary | ICD-10-CM

## 2023-03-21 DIAGNOSIS — F32A Depression, unspecified: Secondary | ICD-10-CM

## 2023-03-21 LAB — TSH: TSH: 58.97 u[IU]/mL — ABNORMAL HIGH (ref 0.35–5.50)

## 2023-03-21 MED ORDER — ALPRAZOLAM 0.5 MG PO TABS: 0.2500 mg | ORAL_TABLET | Freq: Two times a day (BID) | ORAL | 1 refills | Status: DC | PRN

## 2023-03-21 MED ORDER — FLUOXETINE HCL 10 MG PO CAPS: 20.0000 mg | ORAL_CAPSULE | Freq: Every day | ORAL | Status: DC

## 2023-03-21 MED ORDER — FLUOXETINE HCL 10 MG PO CAPS: 20.0000 mg | ORAL_CAPSULE | Freq: Every day | ORAL | 0 refills | Status: DC

## 2023-03-21 NOTE — Patient Instructions (Addendum)
Take fluoxetine 10 mg: 2 tablets every day Start Xanax: Half tablet or 1 tablet twice daily only as needed.    GO TO THE LAB : Get the blood work     GO TO THE FRONT DESK, [place] Come back for checkup in 6 weeks.   Vaccines I recommend:  Tdap (tetanus) RSV vaccine  Please bring [place] a copy of your Healthcare Power of Attorney for your chart.

## 2023-03-21 NOTE — Progress Notes (Signed)
Subjective:    Patient ID: Sandra Owen, female    DOB: 09-Jan-1951, 72 y.o.   MRN: [id number]  DOS:  03/21/2023 Type of visit - description: Multiple symptoms  Her main concern is anxiety, stress related to deaths in the family lately, also her husband's  behavior, apparently he has frontotemporal dementia. On fluoxetine 10 mg daily, sometimes take double and sometimes skips. Depression is not a major issue. Lately she tried Xanax from a friend with great relief of acute exacerbation of anxiety.  Start xanax?.  Also, having sometimes hot flashes. For that reason 3 days ago she self decreased the dose of Synthroid. No fever chills No weight loss No cough No blood in the stools.    Review of Systems See above   Past Medical History:  Diagnosis Date   Allergic rhinitis    Anxiety and depression    not currently   Hypertension    Hypothyroidism    PONV (postoperative nausea and vomiting)     Past Surgical History:  Procedure Laterality Date   ANTERIOR AND POSTERIOR REPAIR N/A 06/25/2013   Procedure: ANTERIOR (CYSTOCELE) AND POSTERIOR REPAIR (RECTOCELE);  Surgeon: Gus Height, MD;  Location: [facility];  Service: Gynecology;  Laterality: N/A;   BUNIONECTOMY  1999   TONSILLECTOMY     TUBAL LIGATION     VAGINAL HYSTERECTOMY N/A 06/25/2013   HYSTERECTOMY VAGINAL;  Surgeon: Gus Height, MD;  Location: [facility];  Service: Gynecology;  Laterality: N/A;    Current Outpatient Medications  Medication Instructions   ALPRAZolam (XANAX) 0.25-0.5 mg, Oral, 2 times daily PRN   FLUoxetine (PROZAC) 20 mg, Oral, Daily   hydrochlorothiazide (HYDRODIURIL) 25 mg, Oral, Daily   HYDROcodone-acetaminophen (NORCO/VICODIN) 5-325 MG tablet 1 tablet, Oral, Daily PRN   levothyroxine (SYNTHROID) 88 mcg, Oral, Daily before breakfast   ondansetron (ZOFRAN) 4 mg, Oral, Every 8 hours PRN, Due for appt 1/24   pantoprazole (PROTONIX) 40 MG tablet TAKE 1 TABLET(40 MG) BY MOUTH DAILY BEFORE BREAKFAST        Objective:   Physical Exam BP 126/80   Pulse 75   Temp 98.5 F (36.9 C) (Oral)   Resp 16   Ht 5\' 4"  (1.626 m)   Wt 134 lb (60.8 kg)   SpO2 97%   BMI 23.00 kg/m  General:   Well developed, NAD, BMI noted. HEENT:  Normocephalic . Face symmetric, atraumatic Neck: No thyromegaly Lungs:  CTA B Normal respiratory effort, no intercostal retractions, no accessory muscle use. Heart: RRR,  no murmur.  Lower extremities: no pretibial edema bilaterally  Skin: Not pale. Not jaundice Neurologic:  alert & oriented X3.  Speech normal, gait appropriate for age and unassisted Psych--  Cognition and judgment appear intact.  Cooperative with normal attention span and concentration.  Behavior appropriate. Slightly anxious but not depressed     Assessment     Assessment   HTN Hypothyroidism DJD--shoulder, neck,  on hydrocodone; h/o intolerance to NSAIDs (GI s/e)  H/o Anxiety depression Shingles 12-2015 Seasonal allergies  PLAN Anxiety depression: Related to 3 deaths in the family and her husband's  behavior.  On fluoxetine 10 but sometimes take double and sometimes skips.  Tried Xanax from a friend and feel tremendously without oversedation. Plan: Increase fluoxetine to 20 mg every day, no skipping. Risk of Xanax discussed including addiction and misuse or overuse, she understands and elected to proceed.  Will do Xanax 0.5 MGs twice daily as needed.  PDMP reviewed. Reassess in 6 weeks. Hypothyroidism:  Last TSH was almost a year ago, 0.1.  Poor compliance with follow-up TSH.  Currently on Synthroid 88 mcg daily but in the last 3 days she has been cutting down the dose because hot flashes reportedly she feels better. Plan: Check TSH, further advise  w/ results, strongly encourage to come back for routine TSH checks.  Encouraged against self change on dosage. DJD: On hydrocodone, PDMP reviewed, too early to refill. Hot flashes, sweats: Reassess on RTC RTC 6 weeks

## 2023-03-22 ENCOUNTER — Ambulatory Visit: Payer: BLUE CROSS/BLUE SHIELD | Admitting: Internal Medicine

## 2023-03-22 ENCOUNTER — Encounter: Payer: Self-pay | Admitting: Internal Medicine

## 2023-03-22 MED ORDER — LEVOTHYROXINE SODIUM 100 MCG PO TABS: 100.0000 ug | ORAL_TABLET | Freq: Every day | ORAL | 0 refills | Status: DC

## 2023-03-22 NOTE — Assessment & Plan Note (Signed)
Anxiety depression: Related to 3 deaths in the family and her husband's  behavior.  On fluoxetine 10 but sometimes take double and sometimes skips.  Tried Xanax from a friend and feel tremendously without oversedation. Plan: Increase fluoxetine to 20 mg every day, no skipping. Risk of Xanax discussed including addiction and misuse or overuse, she understands and elected to proceed.  Will do Xanax 0.5 MGs twice daily as needed.  PDMP reviewed. Reassess in 6 weeks. Hypothyroidism: Last TSH was almost a year ago, 0.1.  Poor compliance with follow-up TSH.  Currently on Synthroid 88 mcg daily but in the last 3 days she has been cutting down the dose because hot flashes reportedly she feels better. Plan: Check TSH, further advise  w/ results, strongly encourage to come back for routine TSH checks.  Encouraged against self change on dosage. DJD: On hydrocodone, PDMP reviewed, too early to refill. Hot flashes, sweats: Reassess on RTC RTC 6 weeks

## 2023-03-22 NOTE — Addendum Note (Signed)
Addended byDamita Dunnings D on: 03/22/2023 04:25 PM   Modules accepted: Orders

## 2023-03-23 LAB — DRUG MONITORING PANEL 375977 , URINE
Alcohol Metabolites: NEGATIVE ng/mL (ref <500)
Amphetamines: NEGATIVE ng/mL (ref <500)
Barbiturates: NEGATIVE ng/mL (ref <300)
Benzodiazepines: NEGATIVE ng/mL (ref <100)
Cocaine Metabolite: NEGATIVE ng/mL (ref <150)
Codeine: NEGATIVE ng/mL (ref <50)
Desmethyltramadol: NEGATIVE ng/mL (ref <100)
Hydrocodone: NEGATIVE ng/mL (ref <50)
Hydromorphone: NEGATIVE ng/mL (ref <50)
Marijuana Metabolite: 51 ng/mL — ABNORMAL HIGH (ref <5)
Marijuana Metabolite: POSITIVE ng/mL — AB (ref <20)
Morphine: NEGATIVE ng/mL (ref <50)
Norhydrocodone: 241 ng/mL — ABNORMAL HIGH (ref <50)
Opiates: POSITIVE ng/mL — AB (ref <100)
Oxycodone: NEGATIVE ng/mL (ref <100)
Tramadol: NEGATIVE ng/mL (ref <100)

## 2023-03-23 LAB — DM TEMPLATE

## 2023-03-24 ENCOUNTER — Encounter: Payer: Self-pay | Admitting: Internal Medicine

## 2023-04-02 ENCOUNTER — Other Ambulatory Visit: Payer: Self-pay | Admitting: Internal Medicine

## 2023-04-02 DIAGNOSIS — M542 Cervicalgia: Secondary | ICD-10-CM

## 2023-04-03 MED ORDER — HYDROCODONE-ACETAMINOPHEN 5-325 MG PO TABS: 1.0000 | ORAL_TABLET | Freq: Every day | ORAL | 0 refills | Status: DC | PRN

## 2023-04-03 NOTE — Telephone Encounter (Signed)
Requesting: hydrocodone 5-325mg   Contract: 03/28/23 UDS: 03/21/23 Last Visit: 03/21/23 Next Visit: 05/03/23 Last Refill: 01/04/23 #90 and 0RF   Please Advise

## 2023-05-02 ENCOUNTER — Other Ambulatory Visit: Payer: Self-pay

## 2023-05-02 DIAGNOSIS — E039 Hypothyroidism, unspecified: Secondary | ICD-10-CM

## 2023-05-03 ENCOUNTER — Ambulatory Visit: Payer: Medicare Other | Admitting: Internal Medicine

## 2023-05-03 ENCOUNTER — Encounter: Payer: Self-pay | Admitting: Internal Medicine

## 2023-05-03 ENCOUNTER — Other Ambulatory Visit (INDEPENDENT_AMBULATORY_CARE_PROVIDER_SITE_OTHER): Payer: Medicare Other

## 2023-05-03 VITALS — Ht 64.0 in

## 2023-05-03 DIAGNOSIS — E039 Hypothyroidism, unspecified: Secondary | ICD-10-CM

## 2023-05-03 LAB — TSH: TSH: 0.08 u[IU]/mL — ABNORMAL LOW (ref 0.35–5.50)

## 2023-05-03 NOTE — Patient Instructions (Signed)
Vaccines I recommend: Tdap (tetanus) RSV vaccine

## 2023-05-04 MED ORDER — LEVOTHYROXINE SODIUM 88 MCG PO TABS: 88.0000 ug | ORAL_TABLET | Freq: Every day | ORAL | 0 refills | Status: DC

## 2023-05-04 NOTE — Addendum Note (Signed)
Addended byConrad Roaring [place] D on: 05/04/2023 03:40 PM   Modules accepted: Orders

## 2023-05-08 DIAGNOSIS — H52223 Regular astigmatism, bilateral: Secondary | ICD-10-CM | POA: Diagnosis not present

## 2023-05-19 NOTE — Progress Notes (Signed)
Appointment was canceled.

## 2023-05-25 ENCOUNTER — Other Ambulatory Visit: Payer: Self-pay | Admitting: Internal Medicine

## 2023-05-26 MED ORDER — HYDROCHLOROTHIAZIDE 25 MG PO TABS: 25.0000 mg | ORAL_TABLET | Freq: Every day | ORAL | 0 refills | Status: DC

## 2023-06-17 ENCOUNTER — Other Ambulatory Visit: Payer: Self-pay | Admitting: Internal Medicine

## 2023-06-20 ENCOUNTER — Other Ambulatory Visit: Payer: Self-pay | Admitting: Internal Medicine

## 2023-06-23 ENCOUNTER — Encounter: Payer: Self-pay | Admitting: Internal Medicine

## 2023-06-27 ENCOUNTER — Other Ambulatory Visit (INDEPENDENT_AMBULATORY_CARE_PROVIDER_SITE_OTHER): Payer: Medicare Other

## 2023-06-27 DIAGNOSIS — E039 Hypothyroidism, unspecified: Secondary | ICD-10-CM

## 2023-06-28 LAB — TSH: TSH: 0.93 u[IU]/mL (ref 0.35–5.50)

## 2023-06-29 ENCOUNTER — Telehealth: Payer: Self-pay | Admitting: Family Medicine

## 2023-06-29 DIAGNOSIS — M542 Cervicalgia: Secondary | ICD-10-CM

## 2023-06-29 MED ORDER — HYDROCODONE-ACETAMINOPHEN 5-325 MG PO TABS: 1.0000 | ORAL_TABLET | Freq: Every day | ORAL | 0 refills | Status: DC | PRN

## 2023-06-29 NOTE — Telephone Encounter (Signed)
Requesting: hydrocodone 5-325mg  Contract:03/21/23 UDS:03/21/23 Last Visit: 03/21/23 Next Visit: None Last Refill: 04/03/23 #90 and 0RF  Please Advise

## 2023-06-29 NOTE — Telephone Encounter (Signed)
Pdmp ok, rx sent  ? ?

## 2023-06-30 ENCOUNTER — Other Ambulatory Visit: Payer: Self-pay | Admitting: Internal Medicine

## 2023-06-30 DIAGNOSIS — M542 Cervicalgia: Secondary | ICD-10-CM

## 2023-07-18 ENCOUNTER — Other Ambulatory Visit: Payer: Self-pay | Admitting: Internal Medicine

## 2023-08-01 ENCOUNTER — Other Ambulatory Visit: Payer: Self-pay | Admitting: Internal Medicine

## 2023-08-18 ENCOUNTER — Other Ambulatory Visit: Payer: Self-pay | Admitting: Internal Medicine

## 2023-08-20 ENCOUNTER — Emergency Department (HOSPITAL_COMMUNITY): Payer: Medicare Other

## 2023-08-20 ENCOUNTER — Other Ambulatory Visit: Payer: Self-pay

## 2023-08-20 ENCOUNTER — Encounter (HOSPITAL_COMMUNITY): Payer: Self-pay | Admitting: Emergency Medicine

## 2023-08-20 ENCOUNTER — Emergency Department (HOSPITAL_COMMUNITY)
Admission: EM | Admit: 2023-08-20 | Discharge: 2023-08-20 | Disposition: A | Discharge status: Home / Self Care | Payer: Medicare Other | Attending: Emergency Medicine | Admitting: Emergency Medicine

## 2023-08-20 DIAGNOSIS — K7689 Other specified diseases of liver: Secondary | ICD-10-CM | POA: Diagnosis not present

## 2023-08-20 DIAGNOSIS — N179 Acute kidney failure, unspecified: Secondary | ICD-10-CM | POA: Diagnosis not present

## 2023-08-20 DIAGNOSIS — I10 Essential (primary) hypertension: Secondary | ICD-10-CM | POA: Diagnosis not present

## 2023-08-20 DIAGNOSIS — Y900 Blood alcohol level of less than 20 mg/100 ml: Secondary | ICD-10-CM | POA: Insufficient documentation

## 2023-08-20 DIAGNOSIS — F1110 Opioid abuse, uncomplicated: Secondary | ICD-10-CM | POA: Insufficient documentation

## 2023-08-20 DIAGNOSIS — R200 Anesthesia of skin: Secondary | ICD-10-CM | POA: Diagnosis not present

## 2023-08-20 DIAGNOSIS — N390 Urinary tract infection, site not specified: Secondary | ICD-10-CM | POA: Insufficient documentation

## 2023-08-20 DIAGNOSIS — S0990XA Unspecified injury of head, initial encounter: Secondary | ICD-10-CM | POA: Diagnosis not present

## 2023-08-20 DIAGNOSIS — E039 Hypothyroidism, unspecified: Secondary | ICD-10-CM | POA: Diagnosis not present

## 2023-08-20 DIAGNOSIS — K5730 Diverticulosis of large intestine without perforation or abscess without bleeding: Secondary | ICD-10-CM | POA: Diagnosis not present

## 2023-08-20 DIAGNOSIS — E876 Hypokalemia: Secondary | ICD-10-CM | POA: Insufficient documentation

## 2023-08-20 DIAGNOSIS — Z79899 Other long term (current) drug therapy: Secondary | ICD-10-CM | POA: Diagnosis not present

## 2023-08-20 DIAGNOSIS — R55 Syncope and collapse: Secondary | ICD-10-CM | POA: Insufficient documentation

## 2023-08-20 DIAGNOSIS — M50321 Other cervical disc degeneration at C4-C5 level: Secondary | ICD-10-CM | POA: Diagnosis not present

## 2023-08-20 DIAGNOSIS — M47812 Spondylosis without myelopathy or radiculopathy, cervical region: Secondary | ICD-10-CM | POA: Diagnosis not present

## 2023-08-20 DIAGNOSIS — E860 Dehydration: Secondary | ICD-10-CM

## 2023-08-20 DIAGNOSIS — Z20822 Contact with and (suspected) exposure to covid-19: Secondary | ICD-10-CM | POA: Diagnosis not present

## 2023-08-20 DIAGNOSIS — M4319 Spondylolisthesis, multiple sites in spine: Secondary | ICD-10-CM | POA: Diagnosis not present

## 2023-08-20 DIAGNOSIS — F1210 Cannabis abuse, uncomplicated: Secondary | ICD-10-CM | POA: Diagnosis not present

## 2023-08-20 DIAGNOSIS — R42 Dizziness and giddiness: Secondary | ICD-10-CM | POA: Diagnosis not present

## 2023-08-20 DIAGNOSIS — I700 Atherosclerosis of aorta: Secondary | ICD-10-CM | POA: Diagnosis not present

## 2023-08-20 DIAGNOSIS — R112 Nausea with vomiting, unspecified: Secondary | ICD-10-CM | POA: Diagnosis not present

## 2023-08-20 LAB — BASIC METABOLIC PANEL
Anion gap: 15 (ref 5–15)
BUN: 27 mg/dL — ABNORMAL HIGH (ref 8–23)
CO2: 26 mmol/L (ref 22–32)
Calcium: 9.8 mg/dL (ref 8.9–10.3)
Chloride: 98 mmol/L (ref 98–111)
Creatinine, Ser: 1.38 mg/dL — ABNORMAL HIGH (ref 0.44–1.00)
GFR, Estimated: 41 mL/min — ABNORMAL LOW (ref >60)
Glucose, Bld: 118 mg/dL — ABNORMAL HIGH (ref 70–99)
Potassium: 2.7 mmol/L — CL (ref 3.5–5.1)
Sodium: 139 mmol/L (ref 135–145)

## 2023-08-20 LAB — RAPID URINE DRUG SCREEN, HOSP PERFORMED
Amphetamines: NOT DETECTED
Barbiturates: NOT DETECTED
Benzodiazepines: NOT DETECTED
Cocaine: NOT DETECTED
Opiates: POSITIVE — AB
Tetrahydrocannabinol: POSITIVE — AB

## 2023-08-20 LAB — URINALYSIS, ROUTINE W REFLEX MICROSCOPIC
Bacteria, UA: NONE SEEN
Bilirubin Urine: NEGATIVE
Glucose, UA: NEGATIVE mg/dL
Ketones, ur: 20 mg/dL — AB
Nitrite: NEGATIVE
Protein, ur: 30 mg/dL — AB
Specific Gravity, Urine: 1.02 (ref 1.005–1.030)
pH: 5 (ref 5.0–8.0)

## 2023-08-20 LAB — TROPONIN I (HIGH SENSITIVITY)
Troponin I (High Sensitivity): 12 ng/L (ref <18)
Troponin I (High Sensitivity): 10 ng/L (ref <18)

## 2023-08-20 LAB — CBC
HCT: 41.9 % (ref 36.0–46.0)
Hemoglobin: 14.4 g/dL (ref 12.0–15.0)
MCH: 31.6 pg (ref 26.0–34.0)
MCHC: 34.4 g/dL (ref 30.0–36.0)
MCV: 92.1 fL (ref 80.0–100.0)
Platelets: 222 10*3/uL (ref 150–400)
RBC: 4.55 MIL/uL (ref 3.87–5.11)
RDW: 12.8 % (ref 11.5–15.5)
WBC: 6.5 10*3/uL (ref 4.0–10.5)
nRBC: 0 % (ref 0.0–0.2)

## 2023-08-20 LAB — SARS CORONAVIRUS 2 BY RT PCR: SARS Coronavirus 2 by RT PCR: NEGATIVE

## 2023-08-20 LAB — CBG MONITORING, ED: Glucose-Capillary: 124 mg/dL — ABNORMAL HIGH (ref 70–99)

## 2023-08-20 LAB — ETHANOL: Alcohol, Ethyl (B): <10 mg/dL (ref <10)

## 2023-08-20 MED ORDER — POTASSIUM CHLORIDE 10 MEQ/100ML IV SOLN
10.0000 meq | INTRAVENOUS | Status: AC
  Administered 2023-08-20 (×2): 10 meq via INTRAVENOUS
  Filled 2023-08-20 (×2): qty 100

## 2023-08-20 MED ORDER — LACTATED RINGERS IV BOLUS
1000.0000 mL | Freq: Once | INTRAVENOUS | Status: AC
  Administered 2023-08-20: 1000 mL via INTRAVENOUS

## 2023-08-20 MED ORDER — MAGNESIUM SULFATE 2 GM/50ML IV SOLN
2.0000 g | Freq: Once | INTRAVENOUS | Status: AC
  Administered 2023-08-20: 2 g via INTRAVENOUS
  Filled 2023-08-20: qty 50

## 2023-08-20 MED ORDER — ONDANSETRON 4 MG PO TBDP: 4.0000 mg | ORAL_TABLET | Freq: Three times a day (TID) | ORAL | 0 refills | Status: DC | PRN

## 2023-08-20 MED ORDER — DIPHENHYDRAMINE HCL 50 MG/ML IJ SOLN
12.5000 mg | Freq: Once | INTRAMUSCULAR | Status: AC
  Administered 2023-08-20: 12.5 mg via INTRAVENOUS
  Filled 2023-08-20: qty 1

## 2023-08-20 MED ORDER — POTASSIUM CHLORIDE CRYS ER 20 MEQ PO TBCR
40.0000 meq | EXTENDED_RELEASE_TABLET | Freq: Once | ORAL | Status: AC
  Administered 2023-08-20: 40 meq via ORAL
  Filled 2023-08-20: qty 2

## 2023-08-20 MED ORDER — CEFDINIR 300 MG PO CAPS: 300.0000 mg | ORAL_CAPSULE | Freq: Two times a day (BID) | ORAL | 0 refills | Status: AC

## 2023-08-20 MED ORDER — ONDANSETRON HCL 4 MG/2ML IJ SOLN
4.0000 mg | Freq: Once | INTRAMUSCULAR | Status: AC
  Administered 2023-08-20: 4 mg via INTRAVENOUS
  Filled 2023-08-20: qty 2

## 2023-08-20 MED ORDER — SODIUM CHLORIDE 0.9 % IV SOLN
1.0000 g | Freq: Once | INTRAVENOUS | Status: AC
  Administered 2023-08-20: 1 g via INTRAVENOUS
  Filled 2023-08-20: qty 10

## 2023-08-20 MED ORDER — POTASSIUM CHLORIDE CRYS ER 20 MEQ PO TBCR
20.0000 meq | EXTENDED_RELEASE_TABLET | Freq: Two times a day (BID) | ORAL | 0 refills | Status: DC
Start: 2023-08-20 — End: 2023-08-24

## 2023-08-20 MED ORDER — PROCHLORPERAZINE EDISYLATE 10 MG/2ML IJ SOLN
5.0000 mg | Freq: Once | INTRAMUSCULAR | Status: AC
  Administered 2023-08-20: 5 mg via INTRAVENOUS
  Filled 2023-08-20: qty 2

## 2023-08-20 MED ORDER — IOHEXOL 350 MG/ML SOLN
80.0000 mL | Freq: Once | INTRAVENOUS | Status: AC | PRN
  Administered 2023-08-20: 80 mL via INTRAVENOUS

## 2023-08-20 NOTE — ED Provider Notes (Signed)
[place] EMERGENCY DEPARTMENT AT [facility] Provider Note   CSN: [id number] Arrival date & time: 08/20/23  2536     History {Add pertinent medical, surgical, social history, OB history to HPI:1} Chief Complaint  Patient presents with   Loss of Consciousness    Sandra Owen is a 72 y.o. female.  HPI      Syncope Friday evening 11PM, syncope last night 2AM Last 2-3 days has had nausea, vomiting, vomiting water Friday nausea, vomiting about 10 times, no blood in the vomit Stool Tuesday-Wed, maroon darker blood mixed in with stool, no diarrhea, probably twice a day Was laying down just got up, last night on the toilet, after went to bathroom urine stood up then had syncope, first time thinks lightheaded this time it happened so fast Chest tightness Wednesday, palpitations for several days,  does cuff 117/52 and was in 90s too.  Chest tightness does feel it in the back. Not constant.  Standing up makes it better, tries to walk it off.  Shortness of breath Wednesday.  Numbness lower lip right side of face Wednesday or Thursday.  Feels like cramp in right side ofneck.  Balance has been off for a few days. Thinks maybe just a touch of losing thoughts.  Hit head with falls, lost sense of time.  No significant change in vision. No medication changes   Past Medical History:  Diagnosis Date   Allergic rhinitis    Anxiety and depression    not currently   Hypertension    Hypothyroidism    PONV (postoperative nausea and vomiting)          Home Medications Prior to Admission medications   Medication Sig Start Date End Date Taking? Authorizing Provider  ALPRAZolam Prudy Feeler) 0.5 MG tablet Take 0.5-1 tablets (0.25-0.5 mg total) by mouth 2 (two) times daily as needed for anxiety. 03/21/23   Wanda Plump, MD  FLUoxetine (PROZAC) 10 MG capsule TAKE 2 CAPSULES(20 MG) BY MOUTH DAILY 08/01/23   Wanda Plump, MD  hydrochlorothiazide (HYDRODIURIL) 25 MG tablet Take 1 tablet (25 mg  total) by mouth daily. 05/26/23   Wanda Plump, MD  HYDROcodone-acetaminophen (NORCO/VICODIN) 5-325 MG tablet Take 1 tablet by mouth daily as needed for moderate pain. 06/29/23   Wanda Plump, MD  levothyroxine (SYNTHROID) 88 MCG tablet Take 1 tablet (88 mcg total) by mouth daily before breakfast. 05/04/23   Wanda Plump, MD  ondansetron (ZOFRAN) 4 MG tablet Take 1 tablet (4 mg total) by mouth every 8 (eight) hours as needed for nausea or vomiting. Due for appt 1/24 11/24/22   Wanda Plump, MD  pantoprazole (PROTONIX) 40 MG tablet TAKE 1 TABLET(40 MG) BY MOUTH DAILY BEFORE BREAKFAST Patient not taking: Reported on 03/21/2023 08/01/22   Wanda Plump, MD      Allergies    Codeine and Tape    Review of Systems   Review of Systems  Physical Exam Updated Vital Signs BP (!) 188/86 Comment: nurse notified  Pulse 88   Temp 98.6 F (37 C) (Oral)   Resp 14   Ht 5\' 4"  (1.626 m)   Wt 59 kg   SpO2 98%   BMI 22.31 kg/m  Physical Exam  ED Results / Procedures / Treatments   Labs (all labs ordered are listed, but only abnormal results are displayed) Labs Reviewed  CBG MONITORING, ED - Abnormal; Notable for the following components:      Result Value  Glucose-Capillary 124 (*)    All other components within normal limits  CBC  BASIC METABOLIC PANEL  URINALYSIS, ROUTINE W REFLEX MICROSCOPIC  RAPID URINE DRUG SCREEN, HOSP PERFORMED  ETHANOL  TROPONIN I (HIGH SENSITIVITY)    EKG EKG Interpretation Date/Time:  Sunday August 20 2023 06:45:17 EDT Ventricular Rate:  81 PR Interval:  114 QRS Duration:  81 QT Interval:  386 QTC Calculation: 448 R Axis:   81  Text Interpretation: Sinus rhythm Borderline short PR interval Borderline right axis deviation Borderline ST depression, diffuse leads Confirmed by Palumbo, April ([id number]) on 08/20/2023 6:50:52 AM  Radiology CT HEAD WO CONTRAST  Result Date: 08/20/2023 CLINICAL DATA:  72 year old female with syncopal episodes for the past 4 days. EXAM: CT  HEAD WITHOUT CONTRAST TECHNIQUE: Contiguous axial images were obtained from the base of the skull through the vertex without intravenous contrast. RADIATION DOSE REDUCTION: This exam was performed according to the departmental dose-optimization program which includes automated exposure control, adjustment of the mA and/or kV according to patient size and/or use of iterative reconstruction technique. COMPARISON:  None Available. FINDINGS: Brain: Cerebral volume is within normal limits for age. No midline shift, ventriculomegaly, mass effect, evidence of mass lesion, intracranial hemorrhage or evidence of cortically based acute infarction. Minimal for age patchy bilateral white matter hypodensity, significance doubtful. Otherwise normal gray-white differentiation. Vascular: No suspicious intracranial vascular hyperdensity. Skull: Intact.  No acute osseous abnormality identified. Sinuses/Orbits: Visualized paranasal sinuses and mastoids are clear. Other: Visualized orbit soft tissues are within normal limits. Visualized scalp soft tissues are within normal limits. IMPRESSION: Normal for age noncontrast Head CT. Electronically Signed   By: Odessa Fleming M.D.   On: 08/20/2023 07:17    Procedures Procedures  {Document cardiac monitor, telemetry assessment procedure when appropriate:1}  Medications Ordered in ED Medications - No data to display  ED Course/ Medical Decision Making/ A&P   {   Click here for ABCD2, HEART and other calculatorsREFRESH Note before signing :1}                              Medical Decision Making Amount and/or Complexity of Data Reviewed Labs: ordered.   ***  {Document critical care time when appropriate:1} {Document review of labs and clinical decision tools ie heart score, Chads2Vasc2 etc:1}  {Document your independent review of radiology images, and any outside records:1} {Document your discussion with family members, caretakers, and with consultants:1} {Document social  determinants of health affecting pt's care:1} {Document your decision making why or why not admission, treatments were needed:1} Final Clinical Impression(s) / ED Diagnoses Final diagnoses:  None    Rx / DC Orders ED Discharge Orders     None

## 2023-08-20 NOTE — ED Provider Triage Note (Signed)
Emergency Medicine Provider Triage Evaluation Note  Sandra Owen , a 72 y.o. female  was evaluated in triage.  Pt complains of multiple syncope   Review of Systems  Positive: Passing out Negative: rash  Physical Exam  BP (!) 194/136 (BP Location: Right Arm)   Pulse 84   Temp 98.9 F (37.2 C) (Oral)   Resp 14   Ht 5\' 4"  (1.626 m)   Wt 59 kg   SpO2 100%   BMI 22.31 kg/m  Gen:   Awake, no distress   Resp:  Normal effort  MSK:   Moves extremities without difficulty  Other:  No rash  Medical Decision Making  Medically screening exam initiated at 6:42 AM.  Appropriate orders placed.  Lamya Georgiadis Balcerzak was informed that the remainder of the evaluation will be completed by another provider, this initial triage assessment does not replace that evaluation, and the importance of remaining in the ED until their evaluation is complete.     Zedric Deroy, MD 08/20/23 [phone]

## 2023-08-20 NOTE — ED Triage Notes (Signed)
Patient coming to ED for evaluation of multiple syncopal episodes that started on Wednesday.  Reports she noticed symptoms on Wednesday when she was unable to sleep because she had "a lot of people coming in."  Since Wednesday patient has had multiple "blacking out" episodes.  Has had nausea and vomiting.  Reports last night she had two syncopal episodes.  Pt very unsteady when ambulating.  C/o having the "jitters" and numbness to R side of face.   Does not take blood thinners. Unable to recall syncopal episodes.

## 2023-08-23 ENCOUNTER — Encounter: Payer: Self-pay | Admitting: Internal Medicine

## 2023-08-23 ENCOUNTER — Ambulatory Visit (INDEPENDENT_AMBULATORY_CARE_PROVIDER_SITE_OTHER): Payer: Medicare Other | Admitting: Internal Medicine

## 2023-08-23 VITALS — BP 138/88 | HR 67 | Temp 97.7°F | Resp 16 | Ht 64.0 in | Wt 134.4 lb

## 2023-08-23 DIAGNOSIS — K921 Melena: Secondary | ICD-10-CM | POA: Diagnosis not present

## 2023-08-23 DIAGNOSIS — E039 Hypothyroidism, unspecified: Secondary | ICD-10-CM | POA: Diagnosis not present

## 2023-08-23 DIAGNOSIS — F419 Anxiety disorder, unspecified: Secondary | ICD-10-CM

## 2023-08-23 DIAGNOSIS — N390 Urinary tract infection, site not specified: Secondary | ICD-10-CM

## 2023-08-23 DIAGNOSIS — F32A Depression, unspecified: Secondary | ICD-10-CM

## 2023-08-23 DIAGNOSIS — R55 Syncope and collapse: Secondary | ICD-10-CM

## 2023-08-23 DIAGNOSIS — E876 Hypokalemia: Secondary | ICD-10-CM | POA: Diagnosis not present

## 2023-08-23 DIAGNOSIS — R319 Hematuria, unspecified: Secondary | ICD-10-CM

## 2023-08-23 LAB — CBC WITH DIFFERENTIAL/PLATELET
Basophils Absolute: 0.1 10*3/uL (ref 0.0–0.1)
Basophils Relative: 1.2 % (ref 0.0–3.0)
Eosinophils Absolute: 0.3 10*3/uL (ref 0.0–0.7)
Eosinophils Relative: 7.6 % — ABNORMAL HIGH (ref 0.0–5.0)
HCT: 40.1 % (ref 36.0–46.0)
Hemoglobin: 13 g/dL (ref 12.0–15.0)
Lymphocytes Relative: 38.1 % (ref 12.0–46.0)
Lymphs Abs: 1.6 10*3/uL (ref 0.7–4.0)
MCHC: 32.5 g/dL (ref 30.0–36.0)
MCV: 94.9 fl (ref 78.0–100.0)
Monocytes Absolute: 0.4 10*3/uL (ref 0.1–1.0)
Monocytes Relative: 9.8 % (ref 3.0–12.0)
Neutro Abs: 1.9 10*3/uL (ref 1.4–7.7)
Neutrophils Relative %: 43.3 % (ref 43.0–77.0)
Platelets: 220 10*3/uL (ref 150.0–400.0)
RBC: 4.22 Mil/uL (ref 3.87–5.11)
RDW: 13.1 % (ref 11.5–15.5)
WBC: 4.3 10*3/uL (ref 4.0–10.5)

## 2023-08-23 LAB — BASIC METABOLIC PANEL
BUN: 27 mg/dL — ABNORMAL HIGH (ref 6–23)
CO2: 29 meq/L (ref 19–32)
Calcium: 9.2 mg/dL (ref 8.4–10.5)
Chloride: 105 meq/L (ref 96–112)
Creatinine, Ser: 0.93 mg/dL (ref 0.40–1.20)
GFR: 62.11 mL/min (ref >60.00)
Glucose, Bld: 87 mg/dL (ref 70–99)
Potassium: 3.9 meq/L (ref 3.5–5.1)
Sodium: 141 meq/L (ref 135–145)

## 2023-08-23 LAB — IRON: Iron: 63 ug/dL (ref 42–145)

## 2023-08-23 LAB — FERRITIN: Ferritin: 89.2 ng/mL (ref 10.0–291.0)

## 2023-08-23 LAB — TSH: TSH: 1.72 u[IU]/mL (ref 0.35–5.50)

## 2023-08-23 MED ORDER — ALPRAZOLAM 0.5 MG PO TABS: 0.2500 mg | ORAL_TABLET | Freq: Two times a day (BID) | ORAL | 1 refills | Status: DC | PRN

## 2023-08-23 NOTE — Assessment & Plan Note (Signed)
Syncope, dehydration, hypokalemia, UTI. The patient presented to the ER with multiple symptoms as described above. Extensive workup showed hypokalemia, increased creatinine, UTI. She finished potassium supplements and still taking a po  antibiotic. Currently feeling much improved although she is not back to baseline. Orthostatic vital signs: BP held steady when she stood up, heart rate changed from the 60s to the 40s when she sat up . Plan: Finish antibiotics, good hydration, check a BMP, CBC, iron panel (reports maroon stools and some blood with BMs). Reassess in 2 to 3 weeks.  Sooner if not gradually better. Hypothyroidism: Reports good med compliance except for the last few days.  Check TSH. Anxiety, depression: Stress related to her husband's dementia, reports stress is at baseline.  She does not think that is playing a role on her symptoms.  Currently on  fluoxetine, refill Xanax which according to the patient has made a tremendous difference.   Of note this, THC found on her UDS, reports from use of OTC gummies, recommend avoidance. Insomnia: Also, has developed some insomnia, for now recommend to take Xanax at bedtime and reassess on RTC RTC 4 weeks.

## 2023-08-23 NOTE — Progress Notes (Signed)
Subjective:    Patient ID: Sandra Owen, female    DOB: 1951-06-21, 72 y.o.   MRN: [id number]  DOS:  08/23/2023 Type of visit - description: ER follow-up  Went to the ER 08/20/2023.  5 days prior to the ER visit: Developed nausea, vomiting, dark stools with traces of blood when she wiped.  No diarrhea per se.  Drinking very little fluids.  3 days prior to the ER visit: Developed some chest tightness.  2 days prior to the ER visit: Had a syncope x 2, hit her head.  In both cases   LOC was after getting up quickly from lying down.  At the ER, she had extensive workup:   Creatinine was 1.38.  Elevated. Potassium 2.7. CBC normal.  Troponin negative. CT head: No acute.   Urinalysis consistent with UTI. UDS: + Tetrahydrocannabinol. COVID-negative. CT cervical spine: No acute.  + DJD.  CT angio chest abdomen and pelvis: No acute findings. MRI brain: Normal.  Treated with Rocephin, oral antibiotics for UTI, magnesium, Compazine, potassium supplements.  Review of Systems  He is here for follow-up. Overall feels much better than when she went to the ER although she is not back to normal. She has some dizziness that is still persist to some extent Has some headaches. GI symptoms improved, still have some nausea. Ambulatory BP are up and down, from 97/52 167/ 100. She finished K+ supplement, still taking antibiotics by mouth. No fever or chills. Had dysuria other LUTS but that is resolved now. Reports  stress is at baseline.   Past Medical History:  Diagnosis Date   Allergic rhinitis    Anxiety and depression    not currently   Hypertension    Hypothyroidism    PONV (postoperative nausea and vomiting)     Past Surgical History:  Procedure Laterality Date   ANTERIOR AND POSTERIOR REPAIR N/A 06/25/2013   Procedure: ANTERIOR (CYSTOCELE) AND POSTERIOR REPAIR (RECTOCELE);  Surgeon: Miguel Aschoff, MD;  Location: [facility];  Service: Gynecology;  Laterality: N/A;   BUNIONECTOMY  1999    TONSILLECTOMY     TUBAL LIGATION     VAGINAL HYSTERECTOMY N/A 06/25/2013   HYSTERECTOMY VAGINAL;  Surgeon: Miguel Aschoff, MD;  Location: [facility];  Service: Gynecology;  Laterality: N/A;    Current Outpatient Medications  Medication Instructions   ALPRAZolam (XANAX) 0.25-0.5 mg, Oral, 2 times daily PRN   cefdinir (OMNICEF) 300 mg, Oral, 2 times daily   FLUoxetine (PROZAC) 20 mg, Oral, Daily   hydrochlorothiazide (HYDRODIURIL) 25 mg, Oral, Daily   HYDROcodone-acetaminophen (NORCO/VICODIN) 5-325 MG tablet 1 tablet, Oral, Daily PRN   levothyroxine (SYNTHROID) 88 mcg, Oral, Daily before breakfast   ondansetron (ZOFRAN) 4 mg, Oral, Every 8 hours PRN, Due for appt 1/24   ondansetron (ZOFRAN-ODT) 4 mg, Oral, Every 8 hours PRN   pantoprazole (PROTONIX) 40 MG tablet TAKE 1 TABLET(40 MG) BY MOUTH DAILY BEFORE BREAKFAST       Objective:   Physical Exam BP 138/88   Pulse 67   Temp 97.7 F (36.5 C) (Oral)   Resp 16   Ht 5\' 4"  (1.626 m)   Wt 134 lb 6 oz (61 kg)   SpO2 99%   BMI 23.07 kg/m  General:   Well developed, NAD, BMI noted.  HEENT:  Normocephalic . Face symmetric, atraumatic Lungs:  CTA B Normal respiratory effort, no intercostal retractions, no accessory muscle use. Heart: RRR,  no murmur.  Abdomen:  Not distended, soft, non-tender. No rebound  or rigidity.   Skin: Not pale. Not jaundice Lower extremities: no pretibial edema bilaterally  Neurologic:  alert & oriented X3.  Speech normal, gait appropriate for age and unassisted Psych--  Cognition and judgment appear intact.  Cooperative with normal attention span and concentration.  Behavior appropriate. No anxious or depressed appearing.     Assessment     Assessment   HTN Hypothyroidism DJD--shoulder, neck,  on hydrocodone; h/o intolerance to NSAIDs (GI s/e)  H/o Anxiety depression Shingles 12-2015 Seasonal allergies  PLAN Syncope, dehydration, hypokalemia, UTI. The patient presented to the ER with multiple  symptoms as described above. Extensive workup showed hypokalemia, increased creatinine, UTI. She finished potassium supplements and still taking a po  antibiotic. Currently feeling much improved although she is not back to baseline. Orthostatic vital signs: BP held steady when she stood up, heart rate changed from the 60s to the 40s when she sat up . Plan: Finish antibiotics, good hydration, check a BMP, CBC, iron panel (reports maroon stools and some blood with BMs). Reassess in 2 to 3 weeks.  Sooner if not gradually better. Hypothyroidism: Reports good med compliance except for the last few days.  Check TSH. Anxiety, depression: Stress related to her husband's dementia, reports stress is at baseline.  She does not think that is playing a role on her symptoms.  Currently on  fluoxetine, refill Xanax which according to the patient has made a tremendous difference.   Of note this, THC found on her UDS, reports from use of OTC gummies, recommend avoidance. Insomnia: Also, has developed some insomnia, for now recommend to take Xanax at bedtime and reassess on RTC RTC 4 weeks.  Time spent 40 minutes,

## 2023-08-23 NOTE — Patient Instructions (Addendum)
Vaccines I recommend: Tdap (tetanus) RSV vaccine  Continue antibiotics as you are doing  Be sure you take your regular medications including levothyroxine.  Blood work today.  Drink plenty of fluids.  Come back in 1 month, please make an appointment.  If you are not gradually improving come back sooner.

## 2023-08-24 NOTE — Addendum Note (Signed)
Addended by: Willow Ora E on: 08/24/2023 01:56 PM   Modules accepted: Orders

## 2023-09-05 ENCOUNTER — Telehealth: Payer: Self-pay

## 2023-09-05 NOTE — Telephone Encounter (Signed)
Transition Care Management Follow-up Telephone Call Date of discharge and from where: Sandra Owen 9/1 How have you been since you were released from the hospital? Still having headaches and following up with PCP Any questions or concerns? No  Items Reviewed: Did the pt receive and understand the discharge instructions provided? Yes  Medications obtained and verified? No  Other? No  Any new allergies since your discharge? No  Dietary orders reviewed? No Do you have support at home? Yes    Follow up appointments reviewed:  PCP Hospital f/u appt confirmed? Yes  Scheduled to see  on  @ . Specialist Hospital f/u appt confirmed? No  Scheduled to see  on  @ . Are transportation arrangements needed? No  If their condition worsens, is the pt aware to call PCP or go to the Emergency Dept.? Yes Was the patient provided with contact information for the PCP's office or ED? Yes Was to pt encouraged to call back with questions or concerns? Yes

## 2023-09-06 ENCOUNTER — Ambulatory Visit (INDEPENDENT_AMBULATORY_CARE_PROVIDER_SITE_OTHER): Payer: Medicare Other | Admitting: Internal Medicine

## 2023-09-06 ENCOUNTER — Encounter: Payer: Self-pay | Admitting: Internal Medicine

## 2023-09-06 VITALS — BP 132/70 | HR 63 | Temp 97.8°F | Resp 16 | Ht 64.0 in | Wt 133.5 lb

## 2023-09-06 DIAGNOSIS — R109 Unspecified abdominal pain: Secondary | ICD-10-CM

## 2023-09-06 DIAGNOSIS — R399 Unspecified symptoms and signs involving the genitourinary system: Secondary | ICD-10-CM

## 2023-09-06 DIAGNOSIS — R519 Headache, unspecified: Secondary | ICD-10-CM

## 2023-09-06 DIAGNOSIS — G4700 Insomnia, unspecified: Secondary | ICD-10-CM | POA: Diagnosis not present

## 2023-09-06 LAB — URINALYSIS, ROUTINE W REFLEX MICROSCOPIC
Bilirubin Urine: NEGATIVE
Ketones, ur: NEGATIVE
Leukocytes,Ua: NEGATIVE
Nitrite: NEGATIVE
Specific Gravity, Urine: 1.02 (ref 1.000–1.030)
Total Protein, Urine: NEGATIVE
Urine Glucose: NEGATIVE
Urobilinogen, UA: 0.2 (ref 0.0–1.0)
pH: 6.5 (ref 5.0–8.0)

## 2023-09-06 MED ORDER — DICYCLOMINE HCL 20 MG PO TABS: 20.0000 mg | ORAL_TABLET | Freq: Three times a day (TID) | ORAL | 0 refills | Status: DC | PRN

## 2023-09-06 MED ORDER — CYCLOBENZAPRINE HCL 10 MG PO TABS: 10.0000 mg | ORAL_TABLET | Freq: Every evening | ORAL | 0 refills | Status: DC | PRN

## 2023-09-06 NOTE — Assessment & Plan Note (Signed)
Syncope, dehydration, hypokalemia, UTI. See LOV, was seen with above symptoms, no further syncope, hypokalemia resolved. At this point she continue with LUTS, check a UA urine culture. Abdominal cramps: Random, as described above, checking a UA, recent CT abdomen and pelvis reassuring.  Will provide some Bentyl, if symptoms persist consider GI versus urology referral. Headaches, random, last few seconds, recent brain imaging negative.  Observation. Hypothyroidism: Well-controlled per last TSH. Insomnia: Today reports has episodic left leg problems that prevent her from sleeping.  Trial with Flexeril.  She also has Xanax. RTC 6 weeks

## 2023-09-06 NOTE — Progress Notes (Signed)
Subjective:    Patient ID: Sandra Owen, female    DOB: 23-Oct-1951, 72 y.o.   MRN: [id number]  DOS:  09/06/2023 Type of visit - description: Follow-up  Follow-up from last visit, although she feels overall better she still has a number of symptoms.  Urinary frequency: Urinates almost once an hour, urine seems "concentrated" and very yellow.  No dysuria, no gross hematuria.  No fever or chills.  Also has random lower abdominal cramps associated with low back pain, reminiscent to her menses. Admits to some nausea. No diarrhea, some constipation which is not a new symptom.  Stools are normal in color without blood. No acid reflux, no dysphagia.  Also has episodic headaches, last few seconds, random, mostly on the left side.   Review of Systems See above   Past Medical History:  Diagnosis Date   Allergic rhinitis    Anxiety and depression    not currently   Hypertension    Hypothyroidism    PONV (postoperative nausea and vomiting)     Past Surgical History:  Procedure Laterality Date   ANTERIOR AND POSTERIOR REPAIR N/A 06/25/2013   Procedure: ANTERIOR (CYSTOCELE) AND POSTERIOR REPAIR (RECTOCELE);  Surgeon: Miguel Aschoff, MD;  Location: [facility];  Service: Gynecology;  Laterality: N/A;   BUNIONECTOMY  1999   TONSILLECTOMY     TUBAL LIGATION     VAGINAL HYSTERECTOMY N/A 06/25/2013   HYSTERECTOMY VAGINAL;  Surgeon: Miguel Aschoff, MD;  Location: [facility];  Service: Gynecology;  Laterality: N/A;    Current Outpatient Medications  Medication Instructions   ALPRAZolam (XANAX) 0.25-0.5 mg, Oral, 2 times daily PRN   FLUoxetine (PROZAC) 20 mg, Oral, Daily   hydrochlorothiazide (HYDRODIURIL) 25 mg, Oral, Daily   HYDROcodone-acetaminophen (NORCO/VICODIN) 5-325 MG tablet 1 tablet, Oral, Daily PRN   levothyroxine (SYNTHROID) 88 mcg, Oral, Daily before breakfast   ondansetron (ZOFRAN-ODT) 4 mg, Oral, Every 8 hours PRN   pantoprazole (PROTONIX) 40 MG tablet TAKE 1 TABLET(40 MG) BY MOUTH DAILY  BEFORE BREAKFAST       Objective:   Physical Exam BP 132/70   Pulse 63   Temp 97.8 F (36.6 C) (Oral)   Resp 16   Ht 5\' 4"  (1.626 m)   Wt 133 lb 8 oz (60.6 kg)   SpO2 96%   BMI 22.92 kg/m  General:   Well developed, NAD, BMI noted.  HEENT:  Normocephalic . Face symmetric, atraumatic Lungs:  CTA B Normal respiratory effort, no intercostal retractions, no accessory muscle use. Heart: RRR,  no murmur.  Abdomen:  Not distended, soft, non-tender. No rebound or rigidity.   Skin: Not pale. Not jaundice Lower extremities: no pretibial edema bilaterally  Neurologic:  alert & oriented X3.  Speech normal, gait appropriate for age and unassisted Psych--  Cognition and judgment appear intact.  Cooperative with normal attention span and concentration.  Behavior appropriate. No anxious or depressed appearing.     Assessment     Assessment   HTN Hypothyroidism DJD--shoulder, neck,  on hydrocodone; h/o intolerance to NSAIDs (GI s/e)  H/o Anxiety depression Shingles 12-2015 Seasonal allergies  PLAN Syncope, dehydration, hypokalemia, UTI. See LOV, was seen with above symptoms, no further syncope, hypokalemia resolved. At this point she continue with LUTS, check a UA urine culture. Abdominal cramps: Random, as described above, checking a UA, recent CT abdomen and pelvis reassuring.  Will provide some Bentyl, if symptoms persist consider GI versus urology referral. Headaches, random, last few seconds, recent brain imaging negative.  Observation. Hypothyroidism: Well-controlled per last TSH. Insomnia: Today reports has episodic left leg problems that prevent her from sleeping.  Trial with Flexeril.  She also has Xanax. RTC 6 weeks

## 2023-09-06 NOTE — Patient Instructions (Addendum)
  GO TO THE LAB : Provide   a urine sample   Dicyclomine 3 times a day as needed for lower abdominal cramps.  Flexeril, muscle relaxant, at bedtime if needed.   Next visit with me 6 weeks, sooner if needed or severe symptoms. Please schedule it at the front desk

## 2023-09-07 LAB — URINE CULTURE
MICRO NUMBER:: 15483494
Result:: NO GROWTH
SPECIMEN QUALITY:: ADEQUATE

## 2023-09-08 ENCOUNTER — Encounter: Payer: Self-pay | Admitting: Internal Medicine

## 2023-09-14 ENCOUNTER — Ambulatory Visit: Payer: Medicare Other | Admitting: Physician Assistant

## 2023-09-21 ENCOUNTER — Other Ambulatory Visit: Payer: Self-pay | Admitting: Internal Medicine

## 2023-10-01 ENCOUNTER — Telehealth: Payer: Self-pay | Admitting: Internal Medicine

## 2023-10-01 DIAGNOSIS — M542 Cervicalgia: Secondary | ICD-10-CM

## 2023-10-02 MED ORDER — HYDROCODONE-ACETAMINOPHEN 5-325 MG PO TABS: 1.0000 | ORAL_TABLET | Freq: Every day | ORAL | 0 refills | Status: DC | PRN

## 2023-10-02 NOTE — Telephone Encounter (Signed)
PDMP okay, Rx sent 

## 2023-10-02 NOTE — Telephone Encounter (Signed)
Requesting:hydrocodone 5-325mg   Contract: 03/21/23 UDS: 03/21/23 Last Visit: 09/06/23 Next Visit: None Last Refill:  06/29/23 #90 and 0RF  Please Advise

## 2023-10-20 DIAGNOSIS — H524 Presbyopia: Secondary | ICD-10-CM | POA: Diagnosis not present

## 2023-10-21 ENCOUNTER — Other Ambulatory Visit: Payer: Self-pay | Admitting: Internal Medicine

## 2023-11-21 ENCOUNTER — Other Ambulatory Visit: Payer: Self-pay | Admitting: Internal Medicine

## 2023-12-04 ENCOUNTER — Other Ambulatory Visit: Payer: Self-pay | Admitting: Internal Medicine

## 2023-12-04 ENCOUNTER — Telehealth: Payer: Self-pay | Admitting: Internal Medicine

## 2023-12-04 MED ORDER — HYDROCHLOROTHIAZIDE 25 MG PO TABS: 25.0000 mg | ORAL_TABLET | Freq: Every day | ORAL | 0 refills | Status: DC

## 2023-12-04 MED ORDER — ALPRAZOLAM 0.5 MG PO TABS: 0.2500 mg | ORAL_TABLET | Freq: Two times a day (BID) | ORAL | 0 refills | Status: DC | PRN

## 2023-12-04 NOTE — Telephone Encounter (Signed)
Pt was called and scheduled for 12.27.24 @ 2p for follow up. Pt asked if meds could be refilled. Advised a note would be sent back.

## 2023-12-04 NOTE — Telephone Encounter (Signed)
Pt overdue for visit- she was due back in October.

## 2023-12-04 NOTE — Telephone Encounter (Signed)
Requesting: alprazolam 0.5mg  Contract: 03/21/23 UDS: 03/21/23 Last Visit: 09/06/23 Next Visit: 12/15/23 Last Refill: 08/23/23 #30 and 1RF   Please Advise

## 2023-12-04 NOTE — Addendum Note (Signed)
Addended byConrad [place] D on: 12/04/2023 01:45 PM   Modules accepted: Orders

## 2023-12-04 NOTE — Telephone Encounter (Signed)
  PDMP okay, Rx sent x 1 month

## 2023-12-04 NOTE — Telephone Encounter (Signed)
Prescription Request  12/04/2023  Is this a "Controlled Substance" medicine? Yes  LOV: 09/06/2023  What is the name of the medication or equipment?   hydrochlorothiazide (HYDRODIURIL) 25 MG tablet [[id number]]  ALPRAZolam (XANAX) 0.5 MG tablet [[id number]]  Have you contacted your pharmacy to request a refill? No   Which pharmacy would you like this sent to?   [id number] - [place], [place] - [address] AT [address] [place] [address] Phone: [phone] Fax: [phone]  Patient notified that their request is being sent to the clinical staff for review and that they should receive a response within 2 business days.   Please advise at Mobile [phone] (mobile)

## 2023-12-04 NOTE — Addendum Note (Signed)
Addended by: Willow Ora E on: 12/04/2023 02:56 PM   Modules accepted: Orders

## 2023-12-14 ENCOUNTER — Ambulatory Visit
Admission: EM | Admit: 2023-12-14 | Discharge: 2023-12-14 | Disposition: A | Discharge status: Home / Self Care | Payer: Medicare Other | Attending: Physician Assistant | Admitting: Physician Assistant

## 2023-12-14 DIAGNOSIS — N3001 Acute cystitis with hematuria: Secondary | ICD-10-CM | POA: Insufficient documentation

## 2023-12-14 LAB — POCT URINALYSIS DIP (MANUAL ENTRY)
Glucose, UA: NEGATIVE mg/dL
Nitrite, UA: POSITIVE — AB
Protein Ur, POC: >=300 mg/dL — AB
Spec Grav, UA: >=1.03 — AB (ref 1.010–1.025)
Urobilinogen, UA: 1 U/dL
pH, UA: 6 (ref 5.0–8.0)

## 2023-12-14 MED ORDER — CIPROFLOXACIN HCL 500 MG PO TABS: 500.0000 mg | ORAL_TABLET | Freq: Two times a day (BID) | ORAL | 0 refills | Status: DC

## 2023-12-14 MED ORDER — ONDANSETRON 4 MG PO TBDP
4.0000 mg | ORAL_TABLET | Freq: Once | ORAL | Status: AC
  Administered 2023-12-14: 4 mg via ORAL

## 2023-12-14 MED ORDER — ONDANSETRON 4 MG PO TBDP: 4.0000 mg | ORAL_TABLET | Freq: Three times a day (TID) | ORAL | 0 refills | Status: DC | PRN

## 2023-12-14 NOTE — ED Triage Notes (Signed)
"  I started noticing blood in my car yesterday evening, a lot of urinary urgency/frequency, back pain, ? UTI". No injury. No fever.

## 2023-12-14 NOTE — ED Triage Notes (Signed)
"  I did have some vomiting this morning around 3x before leaving [place] today and driving home".

## 2023-12-15 ENCOUNTER — Ambulatory Visit: Payer: Medicare Other | Admitting: Internal Medicine

## 2023-12-16 LAB — URINE CULTURE: Culture: >=100000 — AB

## 2023-12-22 ENCOUNTER — Ambulatory Visit (INDEPENDENT_AMBULATORY_CARE_PROVIDER_SITE_OTHER): Payer: Medicare Other | Admitting: Internal Medicine

## 2023-12-22 ENCOUNTER — Encounter: Payer: Self-pay | Admitting: Internal Medicine

## 2023-12-22 VITALS — BP 130/64 | HR 79 | Temp 98.0°F | Resp 16 | Ht 64.0 in | Wt 138.5 lb

## 2023-12-22 DIAGNOSIS — Z0001 Encounter for general adult medical examination with abnormal findings: Secondary | ICD-10-CM

## 2023-12-22 DIAGNOSIS — Z0000 Encounter for general adult medical examination without abnormal findings: Secondary | ICD-10-CM

## 2023-12-22 DIAGNOSIS — M542 Cervicalgia: Secondary | ICD-10-CM

## 2023-12-22 DIAGNOSIS — Z79899 Other long term (current) drug therapy: Secondary | ICD-10-CM

## 2023-12-22 DIAGNOSIS — E785 Hyperlipidemia, unspecified: Secondary | ICD-10-CM

## 2023-12-22 DIAGNOSIS — I10 Essential (primary) hypertension: Secondary | ICD-10-CM

## 2023-12-22 DIAGNOSIS — R195 Other fecal abnormalities: Secondary | ICD-10-CM

## 2023-12-22 DIAGNOSIS — K921 Melena: Secondary | ICD-10-CM

## 2023-12-22 DIAGNOSIS — M150 Primary generalized (osteo)arthritis: Secondary | ICD-10-CM

## 2023-12-22 DIAGNOSIS — E039 Hypothyroidism, unspecified: Secondary | ICD-10-CM

## 2023-12-22 LAB — COMPREHENSIVE METABOLIC PANEL
ALT: 11 U/L (ref 0–35)
AST: 17 U/L (ref 0–37)
Albumin: 4.4 g/dL (ref 3.5–5.2)
Alkaline Phosphatase: 51 U/L (ref 39–117)
BUN: 29 mg/dL — ABNORMAL HIGH (ref 6–23)
CO2: 25 meq/L (ref 19–32)
Calcium: 9.6 mg/dL (ref 8.4–10.5)
Chloride: 103 meq/L (ref 96–112)
Creatinine, Ser: 0.86 mg/dL (ref 0.40–1.20)
GFR: 68.07 mL/min (ref >60.00)
Glucose, Bld: 76 mg/dL (ref 70–99)
Potassium: 4.2 meq/L (ref 3.5–5.1)
Sodium: 140 meq/L (ref 135–145)
Total Bilirubin: 0.6 mg/dL (ref 0.2–1.2)
Total Protein: 6.4 g/dL (ref 6.0–8.3)

## 2023-12-22 LAB — LIPID PANEL
Cholesterol: 226 mg/dL — ABNORMAL HIGH (ref 0–200)
HDL: 73.4 mg/dL (ref >39.00)
LDL Cholesterol: 135 mg/dL — ABNORMAL HIGH (ref 0–99)
NonHDL: 152.82
Total CHOL/HDL Ratio: 3
Triglycerides: 87 mg/dL (ref 0.0–149.0)
VLDL: 17.4 mg/dL (ref 0.0–40.0)

## 2023-12-22 LAB — TSH: TSH: 0.94 u[IU]/mL (ref 0.35–5.50)

## 2023-12-22 MED ORDER — HYDROCHLOROTHIAZIDE 25 MG PO TABS: 25.0000 mg | ORAL_TABLET | Freq: Every day | ORAL | 1 refills | Status: DC

## 2023-12-22 MED ORDER — FLUOXETINE HCL 10 MG PO CAPS: 20.0000 mg | ORAL_CAPSULE | Freq: Every day | ORAL | 1 refills | Status: DC

## 2023-12-22 MED ORDER — ALPRAZOLAM 0.5 MG PO TABS: 0.2500 mg | ORAL_TABLET | Freq: Two times a day (BID) | ORAL | 3 refills | Status: DC | PRN

## 2023-12-22 MED ORDER — HYDROCODONE-ACETAMINOPHEN 5-325 MG PO TABS: 1.0000 | ORAL_TABLET | Freq: Every day | ORAL | 0 refills | Status: DC | PRN

## 2023-12-22 NOTE — Patient Instructions (Addendum)
 Vaccines I recommend: Tdap (tetanus) You can do it at the pharmacy  Start taking vitamin D  2000 units daily, over-the-counter       GO TO THE LAB : Get the blood work     Next visit with me in 6 months for a routine checkup    Please schedule it at the front desk      Health Care Power of attorney ,  Living will (Advance care planning documents)  If you already have a living will or healthcare power of attorney, is recommended you bring the copy to be scanned in your chart.   The document will be available to all the doctors you see in the system.  Advance care planning is a process that supports adults in  understanding and sharing their preferences regarding future medical care.  The patient's preferences are recorded in documents called Advance Directives and the can be modified at any time while the patient is in full mental capacity.   If you don't have one, please consider create one.      More information at: [email or web address]

## 2023-12-22 NOTE — Assessment & Plan Note (Signed)
 Here for CPX -Td 2011;  pnm 13: 04/2014 -Additional immunization: has  declined consistently, knows benefits; plans to do take Tdap  -CCS: IFOB (-) 2022., 3 otions d/w pt, elected iFOB,  if it came back (+) knows will be referred to GI  -female care: Consistently declines  gyn referral; declines  MMG-DEXA, benefits d/w pt - Diet exercise discussed.  Plans to increase her physical activity -Healthcare POA: Information provided - Recommend vitamin D  supplements -Labs: CMP FLP TSH.

## 2023-12-22 NOTE — Assessment & Plan Note (Signed)
 Here for CPX  HTN: Blood pressure is very good today, continue hydrochlorothiazide , checking labs. Hypothyroidism: On Synthroid , check TSH, RF with results Anxiety depression: Controlled on fluoxetine  and Xanax .  PDMP okay, RF sent DJD: On hydrocodone , typically once a day.  PDMP reviewed, slightly early but I sent a refill. RTC 6 months.

## 2023-12-22 NOTE — Progress Notes (Signed)
 Subjective:    Patient ID: Sandra Owen, female    DOB: November 13, 1951, 73 y.o.   MRN: [id number]  DOS:  12/22/2023 Type of visit - description: CPX  Here for CPX. Went to urgent care several days ago, had cystitis, symptoms resolved. BP was elevated at the time, it is normal today. Denies any other symptoms or concerns  Review of Systems  Other than above, a 14 point review of systems is negative    Past Medical History:  Diagnosis Date   Allergic rhinitis    Anxiety and depression    not currently   Hypertension    Hypothyroidism    PONV (postoperative nausea and vomiting)     Past Surgical History:  Procedure Laterality Date   ANTERIOR AND POSTERIOR REPAIR N/A 06/25/2013   Procedure: ANTERIOR (CYSTOCELE) AND POSTERIOR REPAIR (RECTOCELE);  Surgeon: Peggye Gull, MD;  Location: [facility];  Service: Gynecology;  Laterality: N/A;   BUNIONECTOMY  1999   TONSILLECTOMY     TUBAL LIGATION     VAGINAL HYSTERECTOMY N/A 06/25/2013   HYSTERECTOMY VAGINAL;  Surgeon: Peggye Gull, MD;  Location: [facility];  Service: Gynecology;  Laterality: N/A;   Social History   Socioeconomic History   Marital status: Married    Spouse name: Not on file   Number of children: 1   Years of education: Not on file   Highest education level: Not on file  Occupational History   Occupation: part time , [employer]    Comment: works part time  Tobacco Use   Smoking status: Never   Smokeless tobacco: Never  Vaping Use   Vaping status: Never Used  Substance and Sexual Activity   Alcohol use: Yes    Comment: socially   Drug use: No   Sexual activity: Not Currently  Other Topics Concern   Not on file  Social History Narrative   Lives w/ 2nd husband   Son , arts development officer    Social Drivers of Corporate Investment Banker Strain: Not on file  Food Insecurity: Not on file  Transportation Needs: Not on file  Physical Activity: Not on file  Stress: Not on file  Social Connections: Not on file  Intimate  Partner Violence: Not on file     Current Outpatient Medications  Medication Instructions   ALPRAZolam  (XANAX ) 0.25-0.5 mg, Oral, 2 times daily PRN   Cholecalciferol (VITAMIN D ) 50 MCG (2000 UT) CAPS Take by mouth.   FLUoxetine  (PROZAC ) 20 mg, Oral, Daily   hydrochlorothiazide  (HYDRODIURIL ) 25 mg, Oral, Daily   HYDROcodone -acetaminophen  (NORCO/VICODIN) 5-325 MG tablet 1 tablet, Oral, Daily PRN   levothyroxine  (SYNTHROID ) 88 mcg, Oral, Daily before breakfast   ondansetron  (ZOFRAN -ODT) 4 mg, Oral, Every 8 hours PRN       Objective:   Physical Exam BP 130/64   Pulse 79   Temp 98 F (36.7 C) (Oral)   Resp 16   Ht 5' 4 (1.626 m)   Wt 138 lb 8 oz (62.8 kg)   SpO2 97%   BMI 23.77 kg/m  General: Well developed, NAD, BMI noted Neck: No  thyromegaly  HEENT:  Normocephalic . Face symmetric, atraumatic Lungs:  CTA B Normal respiratory effort, no intercostal retractions, no accessory muscle use. Heart: RRR,  no murmur.  Abdomen:  Not distended, soft, non-tender. No rebound or rigidity.   Lower extremities: no pretibial edema bilaterally  Skin: Exposed areas without rash. Not pale. Not jaundice Neurologic:  alert & oriented X3.  Speech normal,  gait appropriate for age and unassisted Strength symmetric and appropriate for age.  Psych: Cognition and judgment appear intact.  Cooperative with normal attention span and concentration.  Behavior appropriate. No anxious or depressed appearing.     Assessment   Assessment   HTN Hypothyroidism DJD--shoulder, neck,  on hydrocodone ; h/o intolerance to NSAIDs (GI s/e)  Anxiety depression Shingles 12-2015 Seasonal allergies  PLAN Here for CPX -Td 2011;  pnm 13: 04/2014 -Additional immunization: has  declined consistently, knows benefits; plans to do take Tdap  -CCS: IFOB (-) 2022., 3 otions d/w pt, elected iFOB,  if it came back (+) knows will be referred to GI  -female care: Consistently declines  gyn referral; declines   MMG-DEXA, benefits d/w pt - Diet exercise discussed.  Plans to increase her physical activity -Healthcare POA: Information provided - Recommend vitamin D  supplements -Labs: CMP FLP TSH. HTN: Blood pressure is very good today, continue hydrochlorothiazide , checking labs. Hypothyroidism: On Synthroid , check TSH, RF with results Anxiety depression: Controlled on fluoxetine  and Xanax .  PDMP okay, RF sent DJD: On hydrocodone , typically once a day.  PDMP reviewed, slightly early but I sent a refill. RTC 6 months.

## 2023-12-25 LAB — DM TEMPLATE

## 2023-12-25 LAB — DRUG MONITORING PANEL 375977 , URINE
Alcohol Metabolites: NEGATIVE ng/mL (ref <500)
Amphetamines: NEGATIVE ng/mL (ref <500)
Barbiturates: NEGATIVE ng/mL (ref <300)
Benzodiazepines: NEGATIVE ng/mL (ref <100)
Cocaine Metabolite: NEGATIVE ng/mL (ref <150)
Codeine: NEGATIVE ng/mL (ref <50)
Desmethyltramadol: NEGATIVE ng/mL (ref <100)
Hydrocodone: 348 ng/mL — ABNORMAL HIGH (ref <50)
Hydromorphone: NEGATIVE ng/mL (ref <50)
Marijuana Metabolite: 39 ng/mL — ABNORMAL HIGH (ref <5)
Marijuana Metabolite: POSITIVE ng/mL — AB (ref <20)
Morphine: NEGATIVE ng/mL (ref <50)
Norhydrocodone: 1192 ng/mL — ABNORMAL HIGH (ref <50)
Opiates: POSITIVE ng/mL — AB (ref <100)
Oxycodone: NEGATIVE ng/mL (ref <100)
Tramadol: NEGATIVE ng/mL (ref <100)

## 2023-12-25 MED ORDER — ATORVASTATIN CALCIUM 20 MG PO TABS: 20.0000 mg | ORAL_TABLET | Freq: Every day | ORAL | 0 refills | Status: DC

## 2023-12-25 MED ORDER — LEVOTHYROXINE SODIUM 88 MCG PO TABS: 88.0000 ug | ORAL_TABLET | Freq: Every day | ORAL | 1 refills | Status: DC

## 2023-12-25 NOTE — Addendum Note (Signed)
 Addended by: Conrad [place] D on: 12/25/2023 10:26 AM   Modules accepted: Orders

## 2023-12-26 ENCOUNTER — Telehealth: Payer: Self-pay

## 2023-12-26 ENCOUNTER — Encounter: Payer: Self-pay | Admitting: Internal Medicine

## 2023-12-26 DIAGNOSIS — M542 Cervicalgia: Secondary | ICD-10-CM

## 2023-12-26 MED ORDER — HYDROCODONE-ACETAMINOPHEN 5-325 MG PO TABS: 1.0000 | ORAL_TABLET | Freq: Every day | ORAL | 0 refills | Status: DC | PRN

## 2023-12-26 NOTE — Telephone Encounter (Signed)
 Can you send hydrocodone to her new pharmacy please?

## 2023-12-26 NOTE — Telephone Encounter (Signed)
 sent

## 2023-12-26 NOTE — Telephone Encounter (Signed)
 PA initiated via Covermymeds; KEY: [id number]    Additional Information Required Prior Authorization Not Required

## 2024-02-05 ENCOUNTER — Encounter: Payer: Self-pay | Admitting: Internal Medicine

## 2024-02-29 ENCOUNTER — Encounter: Payer: Self-pay | Admitting: Internal Medicine

## 2024-03-01 MED ORDER — AMLODIPINE BESYLATE 5 MG PO TABS: 5.0000 mg | ORAL_TABLET | Freq: Every day | ORAL | 6 refills | Status: DC

## 2024-03-19 ENCOUNTER — Other Ambulatory Visit: Payer: Self-pay | Admitting: Internal Medicine

## 2024-03-27 ENCOUNTER — Encounter: Payer: Self-pay | Admitting: Internal Medicine

## 2024-03-28 ENCOUNTER — Telehealth: Payer: Self-pay | Admitting: Internal Medicine

## 2024-03-28 DIAGNOSIS — M542 Cervicalgia: Secondary | ICD-10-CM

## 2024-03-28 NOTE — Telephone Encounter (Signed)
 Requesting: hydrocodone 5-325mg   Contract: 03/28/23 UDS: 12/22/23 Last Visit: 12/22/23 Next Visit: 04/22/24 #90 and 0RF  Last Refill:  12/26/23 #90 and 0RF   Please Advise

## 2024-03-29 MED ORDER — HYDROCODONE-ACETAMINOPHEN 5-325 MG PO TABS: 1.0000 | ORAL_TABLET | Freq: Every day | ORAL | 0 refills | Status: DC | PRN

## 2024-03-29 NOTE — Telephone Encounter (Signed)
 PDMP okay, Rx sent

## 2024-04-17 ENCOUNTER — Other Ambulatory Visit (INDEPENDENT_AMBULATORY_CARE_PROVIDER_SITE_OTHER)

## 2024-04-17 DIAGNOSIS — Z0000 Encounter for general adult medical examination without abnormal findings: Secondary | ICD-10-CM

## 2024-04-17 DIAGNOSIS — K921 Melena: Secondary | ICD-10-CM

## 2024-04-18 LAB — FECAL OCCULT BLOOD, IMMUNOCHEMICAL: Fecal Occult Bld: POSITIVE — AB

## 2024-04-19 NOTE — Addendum Note (Signed)
 Addended by: Deyanira Fesler D on: 04/19/2024 12:45 PM   Modules accepted: Orders

## 2024-04-22 ENCOUNTER — Encounter: Payer: Self-pay | Admitting: Internal Medicine

## 2024-04-22 ENCOUNTER — Ambulatory Visit (INDEPENDENT_AMBULATORY_CARE_PROVIDER_SITE_OTHER): Admitting: Internal Medicine

## 2024-04-22 VITALS — BP 130/82 | HR 69 | Temp 97.9°F | Resp 16 | Ht 64.0 in | Wt 136.2 lb

## 2024-04-22 DIAGNOSIS — F419 Anxiety disorder, unspecified: Secondary | ICD-10-CM

## 2024-04-22 DIAGNOSIS — M150 Primary generalized (osteo)arthritis: Secondary | ICD-10-CM

## 2024-04-22 DIAGNOSIS — E039 Hypothyroidism, unspecified: Secondary | ICD-10-CM | POA: Diagnosis not present

## 2024-04-22 DIAGNOSIS — Z79899 Other long term (current) drug therapy: Secondary | ICD-10-CM

## 2024-04-22 DIAGNOSIS — E785 Hyperlipidemia, unspecified: Secondary | ICD-10-CM

## 2024-04-22 DIAGNOSIS — R195 Other fecal abnormalities: Secondary | ICD-10-CM

## 2024-04-22 DIAGNOSIS — F32A Depression, unspecified: Secondary | ICD-10-CM

## 2024-04-22 DIAGNOSIS — Z5320 Procedure and treatment not carried out because of patient's decision for unspecified reasons: Secondary | ICD-10-CM

## 2024-04-22 DIAGNOSIS — I10 Essential (primary) hypertension: Secondary | ICD-10-CM | POA: Diagnosis not present

## 2024-04-22 DIAGNOSIS — Z2821 Immunization not carried out because of patient refusal: Secondary | ICD-10-CM

## 2024-04-22 LAB — LIPID PANEL
Cholesterol: 230 mg/dL — ABNORMAL HIGH (ref 0–200)
HDL: 69.3 mg/dL (ref >39.00)
LDL Cholesterol: 142 mg/dL — ABNORMAL HIGH (ref 0–99)
NonHDL: 160.65
Total CHOL/HDL Ratio: 3
Triglycerides: 92 mg/dL (ref 0.0–149.0)
VLDL: 18.4 mg/dL (ref 0.0–40.0)

## 2024-04-22 LAB — TSH: TSH: 0.46 u[IU]/mL (ref 0.35–5.50)

## 2024-04-22 NOTE — Progress Notes (Unsigned)
 Subjective:    Patient ID: Sandra Owen, female    DOB: [birth year], 73 y.o.   MRN: [id number]  DOS:  04/22/2024 Type of visit - description: follow up  Chronic medical problems addressed. She decided not to take atorvastatin . Ambulatory BPs normal. Concerned about the skin lesions on the abdomen. Concerned about her husband, has dementia, + stress. To discuss results of her + I fob  Review of Systems See above   Past Medical History:  Diagnosis Date   Allergic rhinitis    Anxiety and depression    not currently   Hypertension    Hypothyroidism    PONV (postoperative nausea and vomiting)     Past Surgical History:  Procedure Laterality Date   ANTERIOR AND POSTERIOR REPAIR N/A 06/25/2013   Procedure: ANTERIOR (CYSTOCELE) AND POSTERIOR REPAIR (RECTOCELE);  Surgeon: Deann Exon, MD;  Location: [facility];  Service: Gynecology;  Laterality: N/A;   BUNIONECTOMY  1999   TONSILLECTOMY     TUBAL LIGATION     VAGINAL HYSTERECTOMY N/A 06/25/2013   HYSTERECTOMY VAGINAL;  Surgeon: Deann Exon, MD;  Location: [facility];  Service: Gynecology;  Laterality: N/A;    Current Outpatient Medications  Medication Instructions   ALPRAZolam  (XANAX ) 0.25-0.5 mg, Oral, 2 times daily PRN   amLODipine  (NORVASC ) 5 mg, Oral, Daily   FLUoxetine  (PROZAC ) 20 mg, Oral, Daily   hydrochlorothiazide  (HYDRODIURIL ) 25 mg, Oral, Daily   HYDROcodone -acetaminophen  (NORCO/VICODIN) 5-325 MG tablet 1 tablet, Oral, Daily PRN   levothyroxine  (SYNTHROID ) 88 mcg, Oral, Daily before breakfast   Multiple Vitamin (MULTIVITAMIN) tablet 1 tablet, Daily   ondansetron  (ZOFRAN -ODT) 4 mg, Oral, Every 8 hours PRN       Objective:   Physical Exam BP 130/82   Pulse 69   Temp 97.9 F (36.6 C) (Oral)   Resp 16   Ht 5\' 4"  (1.626 m)   Wt 136 lb 4 oz (61.8 kg)   SpO2 97%   BMI 23.39 kg/m  General:   Well developed, NAD, BMI noted. HEENT:  Normocephalic . Face symmetric, atraumatic Skin: Right from the umbilicus on the  abdomen has papular, slightly hyperpigmented rough skin lesion, approximately 0.8 x 0.3 cm in size consistent with SK Neurologic:  alert & oriented X3.  Speech normal, gait appropriate for age and unassisted Psych--  Cognition and judgment appear intact.  Cooperative with normal attention span and concentration.  Behavior appropriate. No anxious or depressed appearing.      Assessment   Assessment   HTN Hypothyroidism DJD--shoulder, neck,  on hydrocodone ; h/o intolerance to NSAIDs (GI s/e)  Anxiety depression Shingles 12-2015 Seasonal allergies  PLAN HTN: On amlodipine , reports good ambulatory BPs, last BMP satisfactory.  No change High cholesterol: Last LDL 135, was recommended atorvastatin  20 mg daily.  Took it for few days better then decided to do better with diet, she is also exercising more. I explained her current cardiovascular risk is 14.2%, we will recheck a FLP today, if she is going in the right direction we will continue w/  lifestyle modification. Hypothyroidism: On Synthroid , last TSH 0.9.  Recheck labs. Anxiety, depression: On Xanax , fluoxetine , last UDS show marijuana, patient reports she takes CBD supplements.  Recheck UDS, RF as needed. SK: Has a skin lesion near the umbilicus, pt is somewhat concerned about it, has been there for years, minimal change in size, no bleeding. Looks benign, we agree on observation and refer to dermatology if changes + Hemoccult: Explained the patient what Hemoccult means, next  step is GI referral, she agreed. Stress: Related to her husband behavioral issues, listening therapy provided.  They have an appointment soon. RTC 5 to 6 months

## 2024-04-22 NOTE — Patient Instructions (Addendum)
 Vaccines I recommend: Tdap (tetanus)  Continue checking your blood pressure regularly Blood pressure goal:  between 110/65 and  135/85. If it is consistently higher or lower, let me know     GO TO THE LAB : Get the blood work     Next office visit for a checkup in 6 months Please make an appointment before you leave today Depending on your blood or XRs results it might be necessary to come back sooner

## 2024-04-23 NOTE — Assessment & Plan Note (Signed)
 HTN: On amlodipine , reports good ambulatory BPs, last BMP satisfactory.  No change High cholesterol: Last LDL 135, was recommended atorvastatin  20 mg daily.  Took it for few days better then decided to do better with diet, she is also exercising more. I explained her current cardiovascular risk is 14.2%, we will recheck a FLP today, if she is going in the right direction we will continue w/  lifestyle modification. Hypothyroidism: On Synthroid , last TSH 0.9.  Recheck labs. Anxiety, depression: On Xanax , fluoxetine , last UDS show marijuana, patient reports she takes CBD supplements.  Recheck UDS, RF as needed. SK: Has a skin lesion near the umbilicus, pt is somewhat concerned about it, has been there for years, minimal change in size, no bleeding. Looks benign, we agree on observation and refer to dermatology if changes + Hemoccult: Explained the patient what Hemoccult means, next step is GI referral, she agreed. Stress: Related to her husband behavioral issues, listening therapy provided.  They have an appointment soon. RTC 5 to 6 months

## 2024-04-24 ENCOUNTER — Encounter: Payer: Self-pay | Admitting: Internal Medicine

## 2024-04-24 LAB — DM TEMPLATE

## 2024-04-24 NOTE — Addendum Note (Signed)
 Addended by: Colston Pyle D on: 04/24/2024 11:23 AM   Modules accepted: Orders

## 2024-04-25 LAB — DRUG MONITORING PANEL 375977 , URINE
Alphahydroxyalprazolam: 92 ng/mL — ABNORMAL HIGH (ref <25)
Alphahydroxymidazolam: NEGATIVE ng/mL (ref <50)
Alphahydroxytriazolam: NEGATIVE ng/mL (ref <50)
Aminoclonazepam: NEGATIVE ng/mL (ref <25)
Barbiturates: NEGATIVE ng/mL (ref <300)
Benzodiazepines: POSITIVE ng/mL — AB (ref <100)
Cocaine Metabolite: NEGATIVE ng/mL (ref <150)
Codeine: NEGATIVE ng/mL (ref <50)
Desmethyltramadol: NEGATIVE ng/mL (ref <100)
Hydrocodone: NEGATIVE ng/mL (ref <50)
Hydromorphone: NEGATIVE ng/mL (ref <50)
Hydroxyethylflurazepam: NEGATIVE ng/mL (ref <50)
Lorazepam: NEGATIVE ng/mL (ref <50)
Marijuana Metabolite: NEGATIVE ng/mL (ref <20)
Marijuana Metabolite: NEGATIVE ng/mL (ref <5)
Morphine: NEGATIVE ng/mL (ref <50)
Nordiazepam: NEGATIVE ng/mL (ref <50)
Norhydrocodone: 74 ng/mL — ABNORMAL HIGH (ref <50)
Opiates: POSITIVE ng/mL — AB (ref <100)
Oxazepam: NEGATIVE ng/mL (ref <50)
Oxycodone: NEGATIVE ng/mL (ref <100)
Temazepam: NEGATIVE ng/mL (ref <50)
Tramadol Comments: NEGATIVE ng/mL (ref <500)
Tramadol: NEGATIVE ng/mL (ref <100)
Tramadol: NEGATIVE ng/mL (ref <500)
medMATCH Summary: NEGATIVE ng/mL (ref <100)

## 2024-04-25 LAB — DM TEMPLATE

## 2024-06-06 ENCOUNTER — Encounter: Payer: Self-pay | Admitting: Internal Medicine

## 2024-06-21 ENCOUNTER — Other Ambulatory Visit: Payer: Self-pay | Admitting: Internal Medicine

## 2024-06-22 ENCOUNTER — Other Ambulatory Visit: Payer: Self-pay | Admitting: Internal Medicine

## 2024-06-25 ENCOUNTER — Other Ambulatory Visit: Payer: Self-pay | Admitting: Internal Medicine

## 2024-06-27 DIAGNOSIS — H52223 Regular astigmatism, bilateral: Secondary | ICD-10-CM | POA: Diagnosis not present

## 2024-07-03 ENCOUNTER — Ambulatory Visit (INDEPENDENT_AMBULATORY_CARE_PROVIDER_SITE_OTHER): Admitting: Internal Medicine

## 2024-07-03 ENCOUNTER — Encounter: Payer: Self-pay | Admitting: Internal Medicine

## 2024-07-03 VITALS — BP 126/80 | HR 78 | Temp 98.0°F | Resp 16 | Ht 64.0 in | Wt 136.0 lb

## 2024-07-03 DIAGNOSIS — R195 Other fecal abnormalities: Secondary | ICD-10-CM

## 2024-07-03 DIAGNOSIS — M542 Cervicalgia: Secondary | ICD-10-CM

## 2024-07-03 DIAGNOSIS — R399 Unspecified symptoms and signs involving the genitourinary system: Secondary | ICD-10-CM

## 2024-07-03 DIAGNOSIS — E039 Hypothyroidism, unspecified: Secondary | ICD-10-CM

## 2024-07-03 DIAGNOSIS — I10 Essential (primary) hypertension: Secondary | ICD-10-CM

## 2024-07-03 DIAGNOSIS — E785 Hyperlipidemia, unspecified: Secondary | ICD-10-CM | POA: Diagnosis not present

## 2024-07-03 LAB — CBC WITH DIFFERENTIAL/PLATELET
Basophils Absolute: 0.1 K/uL (ref 0.0–0.1)
Basophils Relative: 2 % (ref 0.0–3.0)
Eosinophils Absolute: 0.2 K/uL (ref 0.0–0.7)
Eosinophils Relative: 6.3 % — ABNORMAL HIGH (ref 0.0–5.0)
HCT: 41.1 % (ref 36.0–46.0)
Hemoglobin: 14 g/dL (ref 12.0–15.0)
Lymphocytes Relative: 43.6 % (ref 12.0–46.0)
Lymphs Abs: 1.5 K/uL (ref 0.7–4.0)
MCHC: 34.1 g/dL (ref 30.0–36.0)
MCV: 93.4 fl (ref 78.0–100.0)
Monocytes Absolute: 0.4 K/uL (ref 0.1–1.0)
Monocytes Relative: 12.2 % — ABNORMAL HIGH (ref 3.0–12.0)
Neutro Abs: 1.2 K/uL — ABNORMAL LOW (ref 1.4–7.7)
Neutrophils Relative %: 35.9 % — ABNORMAL LOW (ref 43.0–77.0)
Platelets: 205 K/uL (ref 150.0–400.0)
RBC: 4.4 Mil/uL (ref 3.87–5.11)
RDW: 13.9 % (ref 11.5–15.5)
WBC: 3.4 K/uL — ABNORMAL LOW (ref 4.0–10.5)

## 2024-07-03 LAB — BASIC METABOLIC PANEL WITH GFR
BUN: 22 mg/dL (ref 6–23)
CO2: 30 meq/L (ref 19–32)
Calcium: 9.7 mg/dL (ref 8.4–10.5)
Chloride: 101 meq/L (ref 96–112)
Creatinine, Ser: 0.93 mg/dL (ref 0.40–1.20)
GFR: 61.73 mL/min (ref >60.00)
Glucose, Bld: 90 mg/dL (ref 70–99)
Potassium: 4 meq/L (ref 3.5–5.1)
Sodium: 138 meq/L (ref 135–145)

## 2024-07-03 LAB — TSH: TSH: 0.58 u[IU]/mL (ref 0.35–5.50)

## 2024-07-03 LAB — URINALYSIS, ROUTINE W REFLEX MICROSCOPIC
Bilirubin Urine: NEGATIVE
Hgb urine dipstick: NEGATIVE
Ketones, ur: NEGATIVE
Nitrite: NEGATIVE
Specific Gravity, Urine: 1.01 (ref 1.000–1.030)
Total Protein, Urine: NEGATIVE
Urine Glucose: NEGATIVE
Urobilinogen, UA: 0.2 (ref 0.0–1.0)
pH: 6.5 (ref 5.0–8.0)

## 2024-07-03 MED ORDER — HYDROCODONE-ACETAMINOPHEN 5-325 MG PO TABS: 1.0000 | ORAL_TABLET | Freq: Every day | ORAL | 0 refills | Status: DC | PRN

## 2024-07-03 MED ORDER — ALPRAZOLAM 0.5 MG PO TABS: 0.2500 mg | ORAL_TABLET | Freq: Two times a day (BID) | ORAL | 3 refills | Status: DC | PRN

## 2024-07-03 NOTE — Patient Instructions (Signed)
 Please reach out to [facility] gastroenterology at [phone]  Check the  blood pressure regularly Blood pressure goal:  between 110/65 and  135/85. If it is consistently higher or lower, let me know     GO TO THE LAB :  Get the blood work   Your results will be posted on MyChart with my comments  Next office visit for a physical exam by 12/2024 Please make an appointment before you leave today

## 2024-07-03 NOTE — Progress Notes (Unsigned)
 Subjective:    Patient ID: Sandra Owen, female    DOB: 1951/02/02, 73 y.o.   MRN: [id number]  DOS:  07/03/2024 Type of visit - description: follow up   Chronic medical problems addressed.  About 2 weeks ago had urinary frequency, dysuria back pain.  Subsequently saw stone on the urine and symptoms subsided. History of + Hemoccult, denies nausea vomiting.  No change in the color of the stools.   Review of Systems See above   Past Medical History:  Diagnosis Date   Allergic rhinitis    Anxiety and depression    not currently   Hypertension    Hypothyroidism    PONV (postoperative nausea and vomiting)     Past Surgical History:  Procedure Laterality Date   ANTERIOR AND POSTERIOR REPAIR N/A 06/25/2013   Procedure: ANTERIOR (CYSTOCELE) AND POSTERIOR REPAIR (RECTOCELE);  Surgeon: Peggye Gull, MD;  Location: [facility];  Service: Gynecology;  Laterality: N/A;   BUNIONECTOMY  1999   TONSILLECTOMY     TUBAL LIGATION     VAGINAL HYSTERECTOMY N/A 06/25/2013   HYSTERECTOMY VAGINAL;  Surgeon: Peggye Gull, MD;  Location: [facility];  Service: Gynecology;  Laterality: N/A;    Current Outpatient Medications  Medication Instructions   ALPRAZolam  (XANAX ) 0.25-0.5 mg, Oral, 2 times daily PRN   amLODipine  (NORVASC ) 5 mg, Oral, Daily   FLUoxetine  (PROZAC ) 20 mg, Oral, Daily   hydrochlorothiazide  (HYDRODIURIL ) 25 MG tablet TAKE 1 TABLET(25 MG) BY MOUTH DAILY   HYDROcodone -acetaminophen  (NORCO/VICODIN) 5-325 MG tablet 1 tablet, Oral, Daily PRN   levothyroxine  (SYNTHROID ) 88 mcg, Oral, Daily before breakfast   Multiple Vitamin (MULTIVITAMIN) tablet 1 tablet, Daily   ondansetron  (ZOFRAN -ODT) 4 mg, Oral, Every 8 hours PRN       Objective:   Physical Exam BP 126/80   Pulse 78   Temp 98 F (36.7 C) (Oral)   Resp 16   Ht 5' 4 (1.626 m)   Wt 136 lb (61.7 kg)   SpO2 98%   BMI 23.34 kg/m  General:   Well developed, NAD, BMI noted.  HEENT:  Normocephalic . Face symmetric, atraumatic Lungs:   CTA B Normal respiratory effort, no intercostal retractions, no accessory muscle use. Heart: RRR,  no murmur.  Abdomen:  Not distended, soft, non-tender. No rebound or rigidity.   Skin: Not pale. Not jaundice Lower extremities: no pretibial edema bilaterally  Neurologic:  alert & oriented X3.  Speech normal, gait appropriate for age and unassisted Psych--  Cognition and judgment appear intact.  Cooperative with normal attention span and concentration.  Behavior appropriate. No anxious or depressed appearing.     Assessment     Assessment   HTN Hypothyroidism DJD--shoulder, neck,  on hydrocodone ; h/o intolerance to NSAIDs (GI s/e)  Anxiety depression Shingles 12-2015 Seasonal allergies H/o kidney stones   PLAN HTN: On amlodipine  and HCTZ, amb BPs wnl, SBP very rarely is over 135.  Check BMP Hypothyroidism: On Synthroid , check TSH High cholesterol: So far has decided not to take statins, diet has not been the best lately.  Recheck FLP in the future Anxiety depression: PDMP okay, Xanax  refilled, also on fluoxetine . DJD: PDMP okay, hydrocodone  Rx sent. + Hemoccult: No GI symptoms, d/t traveling has not seen GI  but states she will proceed now.  Checking a CBC. Kidney stone: Had a kidney stone as described above, this is not the first time, now asymptomatic.,  Completeness we will check a BMP UA urine culture. RTC 6 months

## 2024-07-04 ENCOUNTER — Encounter: Payer: Self-pay | Admitting: Internal Medicine

## 2024-07-04 DIAGNOSIS — M542 Cervicalgia: Secondary | ICD-10-CM

## 2024-07-04 LAB — URINE CULTURE
MICRO NUMBER:: 16706378
SPECIMEN QUALITY:: ADEQUATE

## 2024-07-04 NOTE — Assessment & Plan Note (Signed)
 HTN: On amlodipine  and HCTZ, amb BPs wnl, SBP very rarely is over 135.  Check BMP Hypothyroidism: On Synthroid , check TSH High cholesterol: So far has decided not to take statins, diet has not been the best lately.  Recheck FLP in the future Anxiety depression: PDMP okay, Xanax  refilled, also on fluoxetine . DJD: PDMP okay, hydrocodone  Rx sent. + Hemoccult: No GI symptoms, d/t traveling has not seen GI  but states she will proceed now.  Checking a CBC. Kidney stone: Had a kidney stone as described above, this is not the first time, now asymptomatic.,  Completeness we will check a BMP UA urine culture. RTC 6 months

## 2024-07-05 MED ORDER — HYDROCODONE-ACETAMINOPHEN 5-325 MG PO TABS: 1.0000 | ORAL_TABLET | Freq: Every day | ORAL | 0 refills | Status: DC | PRN

## 2024-07-08 ENCOUNTER — Encounter: Payer: Self-pay | Admitting: Internal Medicine

## 2024-07-08 ENCOUNTER — Ambulatory Visit: Payer: Self-pay | Admitting: Internal Medicine

## 2024-07-08 DIAGNOSIS — N200 Calculus of kidney: Secondary | ICD-10-CM

## 2024-07-08 DIAGNOSIS — M549 Dorsalgia, unspecified: Secondary | ICD-10-CM

## 2024-07-08 DIAGNOSIS — R1011 Right upper quadrant pain: Secondary | ICD-10-CM

## 2024-07-08 DIAGNOSIS — N1330 Unspecified hydronephrosis: Secondary | ICD-10-CM

## 2024-07-08 DIAGNOSIS — R932 Abnormal findings on diagnostic imaging of liver and biliary tract: Secondary | ICD-10-CM

## 2024-07-08 DIAGNOSIS — K819 Cholecystitis, unspecified: Secondary | ICD-10-CM

## 2024-07-08 MED ORDER — SULFAMETHOXAZOLE-TRIMETHOPRIM 800-160 MG PO TABS: 1.0000 | ORAL_TABLET | Freq: Two times a day (BID) | ORAL | 0 refills | Status: DC

## 2024-07-17 ENCOUNTER — Ambulatory Visit (HOSPITAL_BASED_OUTPATIENT_CLINIC_OR_DEPARTMENT_OTHER)
Admission: RE | Admit: 2024-07-17 | Discharge: 2024-07-17 | Disposition: A | Discharge status: Home / Self Care | Source: Ambulatory Visit | Attending: Internal Medicine | Admitting: Internal Medicine

## 2024-07-17 DIAGNOSIS — N200 Calculus of kidney: Secondary | ICD-10-CM | POA: Insufficient documentation

## 2024-07-17 DIAGNOSIS — N1330 Unspecified hydronephrosis: Secondary | ICD-10-CM | POA: Diagnosis not present

## 2024-07-22 ENCOUNTER — Telehealth: Payer: Self-pay

## 2024-07-22 NOTE — Telephone Encounter (Signed)
 Waiting for radiology read back.

## 2024-07-22 NOTE — Telephone Encounter (Signed)
 Copied from CRM [id number]. Topic: Clinical - Lab/Test Results >> Jul 22, 2024  3:12 PM Chasity T wrote: Reason for CRM: Patient is calling in to get results from recent imagining results. Advised that once Dr Amon reads and signs off then she will receive a message and phone call. Please give her a call once results has been reviewed

## 2024-07-24 ENCOUNTER — Encounter: Payer: Self-pay | Admitting: Medical

## 2024-07-24 ENCOUNTER — Ambulatory Visit (HOSPITAL_BASED_OUTPATIENT_CLINIC_OR_DEPARTMENT_OTHER)
Admission: RE | Admit: 2024-07-24 | Discharge: 2024-07-24 | Disposition: A | Discharge status: Home / Self Care | Source: Ambulatory Visit | Attending: Internal Medicine | Admitting: Internal Medicine

## 2024-07-24 ENCOUNTER — Other Ambulatory Visit (HOSPITAL_COMMUNITY)
Admission: RE | Admit: 2024-07-24 | Discharge: 2024-07-24 | Disposition: A | Discharge status: Home / Self Care | Source: Ambulatory Visit | Attending: Medical | Admitting: Medical

## 2024-07-24 ENCOUNTER — Ambulatory Visit (INDEPENDENT_AMBULATORY_CARE_PROVIDER_SITE_OTHER): Admitting: Medical

## 2024-07-24 ENCOUNTER — Ambulatory Visit: Payer: Self-pay | Admitting: Medical

## 2024-07-24 VITALS — BP 155/70 | HR 73 | Temp 98.0°F | Resp 16 | Ht 64.0 in | Wt 139.0 lb

## 2024-07-24 DIAGNOSIS — R109 Unspecified abdominal pain: Secondary | ICD-10-CM

## 2024-07-24 DIAGNOSIS — R300 Dysuria: Secondary | ICD-10-CM | POA: Insufficient documentation

## 2024-07-24 DIAGNOSIS — H1030 Unspecified acute conjunctivitis, unspecified eye: Secondary | ICD-10-CM

## 2024-07-24 DIAGNOSIS — L089 Local infection of the skin and subcutaneous tissue, unspecified: Secondary | ICD-10-CM

## 2024-07-24 DIAGNOSIS — N1330 Unspecified hydronephrosis: Secondary | ICD-10-CM | POA: Insufficient documentation

## 2024-07-24 DIAGNOSIS — M549 Dorsalgia, unspecified: Secondary | ICD-10-CM

## 2024-07-24 DIAGNOSIS — K828 Other specified diseases of gallbladder: Secondary | ICD-10-CM | POA: Diagnosis not present

## 2024-07-24 DIAGNOSIS — N200 Calculus of kidney: Secondary | ICD-10-CM | POA: Diagnosis not present

## 2024-07-24 DIAGNOSIS — Z87442 Personal history of urinary calculi: Secondary | ICD-10-CM

## 2024-07-24 DIAGNOSIS — K5730 Diverticulosis of large intestine without perforation or abscess without bleeding: Secondary | ICD-10-CM | POA: Diagnosis not present

## 2024-07-24 LAB — CBC WITH DIFFERENTIAL/PLATELET
Basophils Absolute: 0.1 K/uL (ref 0.0–0.1)
Basophils Relative: 2.1 % (ref 0.0–3.0)
Eosinophils Absolute: 0.2 K/uL (ref 0.0–0.7)
Eosinophils Relative: 4.3 % (ref 0.0–5.0)
HCT: 40.5 % (ref 36.0–46.0)
Hemoglobin: 13.8 g/dL (ref 12.0–15.0)
Lymphocytes Relative: 41.3 % (ref 12.0–46.0)
Lymphs Abs: 1.6 K/uL (ref 0.7–4.0)
MCHC: 34 g/dL (ref 30.0–36.0)
MCV: 92.8 fl (ref 78.0–100.0)
Monocytes Absolute: 0.4 K/uL (ref 0.1–1.0)
Monocytes Relative: 10.7 % (ref 3.0–12.0)
Neutro Abs: 1.6 K/uL (ref 1.4–7.7)
Neutrophils Relative %: 41.6 % — ABNORMAL LOW (ref 43.0–77.0)
Platelets: 243 K/uL (ref 150.0–400.0)
RBC: 4.36 Mil/uL (ref 3.87–5.11)
RDW: 13.6 % (ref 11.5–15.5)
WBC: 3.8 K/uL — ABNORMAL LOW (ref 4.0–10.5)

## 2024-07-24 LAB — POCT URINALYSIS DIPSTICK
Bilirubin, UA: NEGATIVE
Blood, UA: NEGATIVE
Glucose, UA: NEGATIVE
Ketones, UA: NEGATIVE
Leukocytes, UA: NEGATIVE
Nitrite, UA: NEGATIVE
Protein, UA: NEGATIVE
Spec Grav, UA: <=1.005 — AB (ref 1.010–1.025)
Urobilinogen, UA: 0.2 U/dL
pH, UA: 7 (ref 5.0–8.0)

## 2024-07-24 LAB — COMPREHENSIVE METABOLIC PANEL WITH GFR
ALT: 14 U/L (ref 0–35)
AST: 20 U/L (ref 0–37)
Albumin: 4.4 g/dL (ref 3.5–5.2)
Alkaline Phosphatase: 42 U/L (ref 39–117)
BUN: 17 mg/dL (ref 6–23)
CO2: 32 meq/L (ref 19–32)
Calcium: 9.9 mg/dL (ref 8.4–10.5)
Chloride: 103 meq/L (ref 96–112)
Creatinine, Ser: 0.79 mg/dL (ref 0.40–1.20)
GFR: 75.05 mL/min (ref >60.00)
Glucose, Bld: 95 mg/dL (ref 70–99)
Potassium: 4.2 meq/L (ref 3.5–5.1)
Sodium: 141 meq/L (ref 135–145)
Total Bilirubin: 0.6 mg/dL (ref 0.2–1.2)
Total Protein: 6.9 g/dL (ref 6.0–8.3)

## 2024-07-24 MED ORDER — FLUCONAZOLE 150 MG PO TABS: 150.0000 mg | ORAL_TABLET | Freq: Every day | ORAL | 0 refills | Status: DC

## 2024-07-24 MED ORDER — CEPHALEXIN 500 MG PO CAPS: 500.0000 mg | ORAL_CAPSULE | Freq: Two times a day (BID) | ORAL | 0 refills | Status: DC

## 2024-07-24 NOTE — Patient Instructions (Addendum)
 Bacterial conjunctivitis, right eye(with pinkish red upper and lower eye lid. Faint pink maxillary with mild/moderate tender. Persistent conjunctivitis unresponsive to Pataday and tobramycin eye MD rx'd. - Prescribed Keflex , advised to wait for lab results before filling.  - Continue tobramycin eye drops. - Instructed to contact eye doctor regarding lack of improvement as well. Get opinion on pataday change if indicated - Advised warm compresses.  Urethritis with persistent dysuria and urethral swelling Persistent dysuria and swelling post-Bactrim  reaction, negative urine culture, possible yeast infection or bacterial vaginosis. - Ordered urine culture again - Prescribed Diflucan  150 mg, one tablet pending studies - Instructed self-swab vaiginal ancillary study for yeast and bacterial vaginosis testing.  Nephrolithiasis with recent passage of stones 3 stones(on discussion no mid lumbar pain. no sciatica distribution. Pain at time over entire back area. some bilateral pain in groin areas and into medial thighs. Recent stone passage with persistent flank pain, awaiting renal ultrasound results. - Request renal ultrasound results, discuss with Dr. Amon. - Ordered CBC and metabolic panel.(stat) - Consider further imaging if ultrasound inconclusive? Discussed with pcp.(Unclear if renal ct indicated?) -based on US  renal results Dr Amon sent message he will order CT  Abdomen pain (normal appetite. No fever. intermittent nausea. no vomiting -faint rt flank/rt lower quadrant tender on exam  Follow up date to be determined after lab review

## 2024-07-24 NOTE — Telephone Encounter (Signed)
 See chart

## 2024-07-24 NOTE — Addendum Note (Signed)
 Addended by: Kashish Yglesias D on: 07/24/2024 12:01 PM   Modules accepted: Orders

## 2024-07-24 NOTE — Telephone Encounter (Signed)
Results back

## 2024-07-24 NOTE — Progress Notes (Signed)
 Subjective:    Patient ID: Sandra Owen, female    DOB: [birth year], 73 y.o.   MRN: [id number]   HPI  Sandra Owen is a 73 year old female with a history of kidney stones who presents with persistent eye infection and urinary symptoms.  Her eye became red approximately two weeks ago during a routine eye checkup. Initially prescribed Pataday by ophthalmologist per pt, which did not improve symptoms. Subsequently, she was prescribed tobramycin eye drops, used for a week without significant improvement. Symptoms include swollen and red eyelids, crusty and sticky yellow discharge, and mild itching in the corner of the eye. She uses a warm damp cloth three to four times a day to clean her eyes, but the condition persists. Experiences slight blurring of vision due to discharge but no significant vision changes. No sharp stabbing eye pain.  Urinary symptoms began after passing three kidney stones a few weeks ago. Prescribed Bactrim  for five days by pcp per pt report, which caused severe nausea and disrupted sleep, leading to discontinuation. Urethra remains red and swollen, with burning and frequent urination, approximately twelve times a day, with urgency. An ultrasound of kidneys and bladder was performed a week ago, but results are pending. Describes intermittent sharp back pain, particularly on the left side, radiating to her groin, and occasional mild pain on the right side. No significant pain radiating down her legs or mid-back pain. Urine culture was negative, and no vaginal discharge or significant itching. Appetite has returned to normal after stopping Bactrim .  Pt states at times pain across back 7/10 with pain in both groin area and into medial thigh areas.  Pt has tried alleve helped some. Pain level 4 now. At times level of pain up to 7/10. Not associated with movement.  Pain in back became worse this week. She states pain does not feel like muscle pain. Pain not worse with movement.  Often pain with no movement/at rest. Rt side little worse than left. At time she passed stones had left side back pain.   07-24-2024 Renal US  IMPRESSION: Mild right hydronephrosis. Cause for this finding is not identified. CT of the abdomen and pelvis without contrast could be used for further evaluation. The exam is otherwise negative  Review of Systems  Constitutional:  Negative for chills and fatigue.  Respiratory:  Negative for cough, chest tightness and wheezing.   Cardiovascular:  Negative for chest pain and palpitations.  Gastrointestinal:  Positive for abdominal pain and nausea. Negative for vomiting.       At times.  Musculoskeletal:  Positive for back pain. Negative for neck pain and neck stiffness.       See hpi  Skin:  Negative for rash.  Neurological:  Negative for dizziness.  Psychiatric/Behavioral:  Negative for behavioral problems.    Past Medical History:  Diagnosis Date   Allergic rhinitis    Anxiety and depression    not currently   Hypertension    Hypothyroidism    PONV (postoperative nausea and vomiting)      Social History   Socioeconomic History   Marital status: Married    Spouse name: Not on file   Number of children: 1   Years of education: Not on file   Highest education level: Not on file  Occupational History   Occupation: part time , [employer]    Comment: works part time  Tobacco Use   Smoking status: Never   Smokeless tobacco: Never  Vaping Use  Vaping status: Never Used  Substance and Sexual Activity   Alcohol use: Yes    Comment: socially   Drug use: No   Sexual activity: Not Currently  Other Topics Concern   Not on file  Social History Narrative   Lives w/ 2nd husband   Son , Arts development officer    Social Drivers of Corporate investment banker Strain: Not on file  Food Insecurity: Not on file  Transportation Needs: Not on file  Physical Activity: Not on file  Stress: Not on file  Social Connections: Not on file  Intimate  Partner Violence: Not on file    Past Surgical History:  Procedure Laterality Date   ANTERIOR AND POSTERIOR REPAIR N/A 06/25/2013   Procedure: ANTERIOR (CYSTOCELE) AND POSTERIOR REPAIR (RECTOCELE);  Surgeon: Peggye Gull, MD;  Location: [facility];  Service: Gynecology;  Laterality: N/A;   BUNIONECTOMY  1999   TONSILLECTOMY     TUBAL LIGATION     VAGINAL HYSTERECTOMY N/A 06/25/2013   HYSTERECTOMY VAGINAL;  Surgeon: Peggye Gull, MD;  Location: [facility];  Service: Gynecology;  Laterality: N/A;    Family History  Problem Relation Age of Onset   Hyperlipidemia Mother    Hypertension Mother    Cerebral aneurysm Mother    Pancreatic cancer Father    Heart disease Brother        age 13   Colon cancer Neg Hx    Breast cancer Neg Hx    Thyroid  disease Neg Hx     Allergies  Allergen Reactions   Codeine Nausea Only   Tape Other (See Comments)    Dermatitis    Current Outpatient Medications on File Prior to Visit  Medication Sig Dispense Refill   ALPRAZolam  (XANAX ) 0.5 MG tablet Take 0.5-1 tablets (0.25-0.5 mg total) by mouth 2 (two) times daily as needed for anxiety. 60 tablet 3   amLODipine  (NORVASC ) 5 MG tablet Take 1 tablet (5 mg total) by mouth daily. 30 tablet 6   FLUoxetine  (PROZAC ) 10 MG capsule Take 2 capsules (20 mg total) by mouth daily. 180 capsule 0   hydrochlorothiazide  (HYDRODIURIL ) 25 MG tablet TAKE 1 TABLET(25 MG) BY MOUTH DAILY 90 tablet 1   HYDROcodone -acetaminophen  (NORCO/VICODIN) 5-325 MG tablet Take 1 tablet by mouth daily as needed for moderate pain (pain score 4-6). 90 tablet 0   levothyroxine  (SYNTHROID ) 88 MCG tablet Take 1 tablet (88 mcg total) by mouth daily before breakfast. 90 tablet 1   Multiple Vitamin (MULTIVITAMIN) tablet Take 1 tablet by mouth daily.     ondansetron  (ZOFRAN -ODT) 4 MG disintegrating tablet Take 1 tablet (4 mg total) by mouth every 8 (eight) hours as needed. 10 tablet 0   sulfamethoxazole -trimethoprim  (BACTRIM  DS) 800-160 MG tablet Take 1 tablet by  mouth 2 (two) times daily. 10 tablet 0   No current facility-administered medications on file prior to visit.    BP (!) 155/70   Pulse 73   Temp 98 F (36.7 C) (Oral)   Resp 16   Ht 5' 4 (1.626 m)   Wt 139 lb (63 kg)   SpO2 98%   BMI 23.86 kg/m         Objective:   Physical Exam  General Appearance- Not in acute distress.    Chest and Lung Exam Auscultation: Breath sounds:-Normal. Clear even and unlabored. Adventitious sounds:- No Adventitious sounds.  Cardiovascular Auscultation:Rythm - Regular, rate and rythm. Heart Sounds -Normal heart sounds.  Abdomen Inspection:-Inspection Normal.  Palpation/Perucssion: Palpation and Percussion of the  abdomen reveal- very faint rt lower quadrant/flank pain. Tender, No Rebound tenderness, No rigidity(Guarding) and No Palpable abdominal masses.  Liver:-Normal.  Spleen:- Normal.   Back No Mid lumbar spine tenderness to palpation.(No cva pain presently on exam)often across entire back. -no pain on movement presently.        Assessment & Plan:   Patient Instructions  Bacterial conjunctivitis, right eye(with pinkish red upper and lower eye lid. Faint pink maxillary with mild/moderate tender. Persistent conjunctivitis unresponsive to Pataday and tobramycin eye MD rx'd. - Prescribed Keflex , advised to wait for lab results before filling.  - Continue tobramycin eye drops. - Instructed to contact eye doctor regarding lack of improvement as well. Get opinion on pataday change if indicated - Advised warm compresses.  Urethritis with persistent dysuria and urethral swelling Persistent dysuria and swelling post-Bactrim  reaction, negative urine culture, possible yeast infection or bacterial vaginosis. - Ordered urine culture again - Prescribed Diflucan  150 mg, one tablet pending studies - Instructed self-swab vaiginal ancillary study for yeast and bacterial vaginosis testing.  Nephrolithiasis with recent passage of stones 3  stones(on discussion no mid lumbar pain. no sciatica distribution. Pain at time over entire back area. some bilateral pain in groin areas and into medial thighs. Recent stone passage with persistent flank pain, awaiting renal ultrasound results. - Request renal ultrasound results, discuss with Dr. Amon. - Ordered CBC and metabolic panel.(stat) - Consider further imaging if ultrasound inconclusive? Discussed with pcp.(Unclear if renal ct indicated?) -based on US  renal results Dr Amon sent message he will order CT  Abdomen pain (normal appetite. No fever. intermittent nausea. no vomiting -faint rt flank/rt lower quadrant tender on exam  Follow up date to be determined after lab review   Dallas Maxwell, PA-C   Time spent with patient today was  45 minutes which consisted of chart review, discussing diagnosis, work up treatment and documentation. Discussed/consulted with Dr. Amon

## 2024-07-24 NOTE — Telephone Encounter (Signed)
 Pt having increased sx's, she saw Dallas today, he requests that I call reading room to have US  renal from 07/17/24 read.    I spoke w/ reading room- they will escalate to radiologist.

## 2024-07-25 LAB — URINE CULTURE
MICRO NUMBER:: 16794700
Result:: NO GROWTH
SPECIMEN QUALITY:: ADEQUATE

## 2024-07-25 LAB — CERVICOVAGINAL ANCILLARY ONLY
Bacterial Vaginitis (gardnerella): NEGATIVE
Comment: NEGATIVE
Comment: NEGATIVE
Comment: NEGATIVE

## 2024-07-25 NOTE — Addendum Note (Signed)
 Addended by: Mykah Bellomo D on: 07/25/2024 01:06 PM   Modules accepted: Orders

## 2024-08-02 ENCOUNTER — Encounter: Payer: Self-pay | Admitting: Internal Medicine

## 2024-08-13 ENCOUNTER — Encounter (HOSPITAL_COMMUNITY)
Admission: RE | Admit: 2024-08-13 | Discharge: 2024-08-13 | Disposition: A | Discharge status: Home / Self Care | Source: Ambulatory Visit | Attending: Internal Medicine | Admitting: Internal Medicine

## 2024-08-13 ENCOUNTER — Encounter: Payer: Self-pay | Admitting: Internal Medicine

## 2024-08-13 DIAGNOSIS — R932 Abnormal findings on diagnostic imaging of liver and biliary tract: Secondary | ICD-10-CM | POA: Diagnosis not present

## 2024-08-13 DIAGNOSIS — R1011 Right upper quadrant pain: Secondary | ICD-10-CM | POA: Diagnosis not present

## 2024-08-13 MED ORDER — TECHNETIUM TC 99M MEBROFENIN IV KIT
5.2000 | PACK | Freq: Once | INTRAVENOUS | Status: AC | PRN
Start: 2024-08-13 — End: 2024-08-13
  Administered 2024-08-13: 5.2 via INTRAVENOUS

## 2024-08-16 ENCOUNTER — Telehealth: Payer: Self-pay | Admitting: Internal Medicine

## 2024-08-16 DIAGNOSIS — K828 Other specified diseases of gallbladder: Secondary | ICD-10-CM

## 2024-08-16 MED ORDER — ONDANSETRON 4 MG PO TBDP: 4.0000 mg | ORAL_TABLET | Freq: Three times a day (TID) | ORAL | 0 refills | Status: DC | PRN

## 2024-08-16 NOTE — Telephone Encounter (Signed)
 Spoke w/ Pt- informed of results and recommendations. Pt states she will contact GI. Rx for Zofran  sent to [facility]. General surgery referral placed.

## 2024-08-16 NOTE — Telephone Encounter (Signed)
 HIDA scan consistent with biliary dyskinesia Please call patient: Referred to general surgery. Okay to refill Zofran  4 mg 3 times daily, #30 no refills. Seek medical attention if severe symptoms, nausea, fever, chills. Please remind her she still needs to see GI for her history of + Hemoccult.

## 2024-08-28 ENCOUNTER — Telehealth: Payer: Self-pay

## 2024-08-28 DIAGNOSIS — K828 Other specified diseases of gallbladder: Secondary | ICD-10-CM | POA: Diagnosis not present

## 2024-08-28 NOTE — Telephone Encounter (Signed)
Referral information was sent.

## 2024-08-28 NOTE — Telephone Encounter (Signed)
 Copied from CRM [id number]. Topic: General - Other >> Aug 28, 2024  9:41 AM [name] wrote: Reason for CRM: Patient called in regarding referral at [facility], wanted to make sure all information was sent over for her appointment today, if already been sent then doesn't have to contact

## 2024-09-10 ENCOUNTER — Telehealth: Payer: Self-pay | Admitting: Internal Medicine

## 2024-09-10 NOTE — Telephone Encounter (Signed)
 Copied from CRM [id number]. Topic: Medicare AWV >> Sep 10, 2024  2:29 PM Nathanel DEL wrote: Reason for CRM: Called LVM 09/10/2024 to schedule AWV. Please schedule office or virtual visits  Nathanel Paschal; Care Guide Ambulatory Clinical Support [place] [facility] Direct Dial: [phone]

## 2024-09-18 ENCOUNTER — Other Ambulatory Visit: Payer: Self-pay | Admitting: Internal Medicine

## 2024-10-04 ENCOUNTER — Encounter: Payer: Self-pay | Admitting: Internal Medicine

## 2024-10-04 DIAGNOSIS — M542 Cervicalgia: Secondary | ICD-10-CM

## 2024-10-04 MED ORDER — HYDROCODONE-ACETAMINOPHEN 5-325 MG PO TABS: 1.0000 | ORAL_TABLET | Freq: Every day | ORAL | 0 refills | Status: DC | PRN

## 2024-10-04 NOTE — Telephone Encounter (Signed)
 Requesting: hydrocodone  5-325mg  Contract:03/28/23 UDS:04/22/24 Last Visit: 07/03/24 Next Visit: None Last Refill: 07/05/24 #90 and 0RF   Pt still not scheduled to see GI.   Please Advise

## 2024-10-04 NOTE — Telephone Encounter (Signed)
 PDMP reviewed, apparently she was dispensed hydrocodone   on 07/04/2024 and 07/05/2024. Nevertheless at this point is okay to prescribe hydrocodone .

## 2024-10-28 ENCOUNTER — Other Ambulatory Visit: Payer: Self-pay | Admitting: General Surgery

## 2024-10-28 DIAGNOSIS — K828 Other specified diseases of gallbladder: Secondary | ICD-10-CM | POA: Diagnosis not present

## 2024-10-29 LAB — SURGICAL PATHOLOGY

## 2024-12-14 ENCOUNTER — Other Ambulatory Visit: Payer: Self-pay | Admitting: Internal Medicine

## 2024-12-17 ENCOUNTER — Other Ambulatory Visit: Payer: Self-pay | Admitting: Internal Medicine

## 2024-12-19 ENCOUNTER — Encounter: Payer: Self-pay | Admitting: Internal Medicine

## 2024-12-24 ENCOUNTER — Telehealth: Payer: Self-pay | Admitting: Internal Medicine

## 2024-12-24 NOTE — Telephone Encounter (Signed)
 Copied from CRM #[id number]. Topic: Medicare AWV >> Dec 24, 2024  9:20 AM Nathanel DEL wrote: Called LVM 12/24/2024 to sched AWVI. Please schedule AWVI in office.   Nathanel Paschal; Care Guide Ambulatory Clinical Support [place] [facility] Direct Dial: [phone]

## 2025-01-03 ENCOUNTER — Encounter: Payer: Self-pay | Admitting: Internal Medicine

## 2025-01-03 DIAGNOSIS — M542 Cervicalgia: Secondary | ICD-10-CM

## 2025-01-03 MED ORDER — HYDROCODONE-ACETAMINOPHEN 5-325 MG PO TABS: 1.0000 | ORAL_TABLET | Freq: Every day | ORAL | 0 refills | Status: AC | PRN

## 2025-01-03 MED ORDER — ALPRAZOLAM 0.5 MG PO TABS: 0.2500 mg | ORAL_TABLET | Freq: Two times a day (BID) | ORAL | 0 refills | Status: AC | PRN

## 2025-01-03 NOTE — Telephone Encounter (Signed)
 Requesting: alprazolam  and hydrocodone  Contract: 03/28/23 UDS: 04/22/24 Last Visit: 07/03/24 Next Visit: None Last Refill: see med list   Please Advise

## 2025-01-03 NOTE — Telephone Encounter (Signed)
 PDMP okay, I noticed that they prescribed tramadol.  Will advise patient not to mix it with hydrocodone .

## 2025-01-07 NOTE — Progress Notes (Signed)
 Sandra Owen                                          MRN: [id number]   01/07/2025   The [facility] Quality Team Specialist reviewed this patient medical record for the purposes of chart review for care gap closure. The following were reviewed: chart review for care gap closure-controlling blood pressure.    [facility] Quality Team

## 2025-01-08 ENCOUNTER — Ambulatory Visit (INDEPENDENT_AMBULATORY_CARE_PROVIDER_SITE_OTHER): Admitting: Internal Medicine

## 2025-01-08 ENCOUNTER — Encounter: Payer: Self-pay | Admitting: Internal Medicine

## 2025-01-08 VITALS — BP 128/68 | HR 70 | Temp 97.8°F | Resp 18 | Ht 64.0 in | Wt 129.5 lb

## 2025-01-08 DIAGNOSIS — N390 Urinary tract infection, site not specified: Secondary | ICD-10-CM

## 2025-01-08 DIAGNOSIS — E039 Hypothyroidism, unspecified: Secondary | ICD-10-CM

## 2025-01-08 DIAGNOSIS — E785 Hyperlipidemia, unspecified: Secondary | ICD-10-CM

## 2025-01-08 DIAGNOSIS — I10 Essential (primary) hypertension: Secondary | ICD-10-CM | POA: Diagnosis not present

## 2025-01-08 DIAGNOSIS — R195 Other fecal abnormalities: Secondary | ICD-10-CM | POA: Diagnosis not present

## 2025-01-08 DIAGNOSIS — Z79899 Other long term (current) drug therapy: Secondary | ICD-10-CM

## 2025-01-08 DIAGNOSIS — J019 Acute sinusitis, unspecified: Secondary | ICD-10-CM

## 2025-01-08 DIAGNOSIS — M549 Dorsalgia, unspecified: Secondary | ICD-10-CM

## 2025-01-08 DIAGNOSIS — F32A Depression, unspecified: Secondary | ICD-10-CM

## 2025-01-08 LAB — BASIC METABOLIC PANEL WITH GFR
BUN: 23 mg/dL (ref 6–23)
CO2: 31 meq/L (ref 19–32)
Calcium: 9.9 mg/dL (ref 8.4–10.5)
Chloride: 103 meq/L (ref 96–112)
Creatinine, Ser: 0.84 mg/dL (ref 0.40–1.20)
GFR: 69.5 mL/min
Glucose, Bld: 89 mg/dL (ref 70–99)
Potassium: 4.4 meq/L (ref 3.5–5.1)
Sodium: 142 meq/L (ref 135–145)

## 2025-01-08 LAB — URINALYSIS, ROUTINE W REFLEX MICROSCOPIC
Bilirubin Urine: NEGATIVE
Ketones, ur: NEGATIVE
Leukocytes,Ua: NEGATIVE
Nitrite: NEGATIVE
RBC / HPF: NONE SEEN
Specific Gravity, Urine: 1.02 (ref 1.000–1.030)
Total Protein, Urine: NEGATIVE
Urine Glucose: NEGATIVE
Urobilinogen, UA: 0.2 (ref 0.0–1.0)
WBC, UA: NONE SEEN
pH: 6 (ref 5.0–8.0)

## 2025-01-08 LAB — TSH: TSH: 0.65 u[IU]/mL (ref 0.35–5.50)

## 2025-01-08 MED ORDER — AMOXICILLIN 875 MG PO TABS: 875.0000 mg | ORAL_TABLET | Freq: Two times a day (BID) | ORAL | 0 refills | Status: AC

## 2025-01-08 NOTE — Progress Notes (Unsigned)
 "  Subjective:    Patient ID: Sandra Owen, female    DOB: March 10, 1951, 74 y.o.   MRN: [id number]  DOS:  01/08/2025 Routine checkup  Discussed the use of AI scribe software for clinical note transcription with the patient, who gave verbal consent to proceed.  History of Present Illness Sinus congestion and rhinorrhea - Persistent sinus symptoms since mid-December with partial improvement but no resolution - Recent worsening of sinus congestion - Nasal drainage is green and occasionally blood tinged - no sputum per se  - Pain around the eyes - Throat pain worsening as drainage increases  - Intermittent hot and cold sensations with chills.  No documented fever  Response to symptomatic treatments - Limited relief with Flonase , various over-the-counter cold and flu medications, and Vicks Vaporub  - Blood pressure increased while using cold medicine but improved after discontinuation  - No urinary symptoms     Review of Systems See above   Past Medical History:  Diagnosis Date   Allergic rhinitis    Anxiety and depression    not currently   Hypertension    Hypothyroidism    PONV (postoperative nausea and vomiting)     Past Surgical History:  Procedure Laterality Date   ANTERIOR AND POSTERIOR REPAIR N/A 06/25/2013   Procedure: ANTERIOR (CYSTOCELE) AND POSTERIOR REPAIR (RECTOCELE);  Surgeon: Peggye Gull, MD;  Location: [facility];  Service: Gynecology;  Laterality: N/A;   BUNIONECTOMY  1999   TONSILLECTOMY     TUBAL LIGATION     VAGINAL HYSTERECTOMY N/A 06/25/2013   HYSTERECTOMY VAGINAL;  Surgeon: Peggye Gull, MD;  Location: [facility];  Service: Gynecology;  Laterality: N/A;    Current Outpatient Medications  Medication Instructions   ALPRAZolam  (XANAX ) 0.25-0.5 mg, Oral, 2 times daily PRN   amLODipine  (NORVASC ) 5 mg, Oral, Daily   amoxicillin  (AMOXIL ) 875 mg, Oral, 2 times daily   FLUoxetine  (PROZAC ) 20 mg, Oral, Daily   hydrochlorothiazide  (HYDRODIURIL ) 25 mg, Oral, Daily    HYDROcodone -acetaminophen  (NORCO/VICODIN) 5-325 MG tablet 1 tablet, Oral, Daily PRN   levothyroxine  (SYNTHROID ) 88 mcg, Oral, Daily before breakfast   Multiple Vitamin (MULTIVITAMIN) tablet 1 tablet, Daily       Objective:   Physical Exam BP 128/68   Pulse 70   Temp 97.8 F (36.6 C) (Oral)   Resp 18   Ht 5' 4 (1.626 m)   Wt 129 lb 8 oz (58.7 kg)   SpO2 97%   BMI 22.23 kg/m  General:   Well developed, NAD, BMI noted. HEENT:  Normocephalic . Face symmetric, atraumatic. Throat symmetric.  Nose congested, sinuses slightly TTP Lungs:  CTA B Normal respiratory effort, no intercostal retractions, no accessory muscle use. Heart: RRR,  no murmur.  Lower extremities: no pretibial edema bilaterally  Skin: Not pale. Not jaundice Neurologic:  alert & oriented X3.  Speech normal, gait appropriate for age and unassisted Psych--  Cognition and judgment appear intact.  Cooperative with normal attention span and concentration.  Behavior appropriate. No anxious or depressed appearing.      Assessment   Assessment   HTN Hypothyroidism DJD--shoulder, neck,  on hydrocodone ; h/o intolerance to NSAIDs (GI s/e)  Anxiety depression Shingles 12-2015 Seasonal allergies H/o kidney stones    Assessment & Plan UTI: Few months ago had leukocyturia and bacteriuria, urine culture negative, was treated with antibiotics. Renal ultrasound showed right hydronephrosis, follow-up CT 07/24/2024: Mildly prominent right renal extra renal pelvis without hydronephrosis. Currently with no symptoms, for completeness we will get  a UA urine culture. Cholelithiasis: Complaining of stomach problems, HIDA scan August 2025 consistent with biliary dyskinesia, referred to surgery, s/p cholecystectomy 10/2024. Acute sinusitis Respiratory symptoms for a month, currently with sinus pain, congestion and some nasal discharge. Plan: - Amoxicillin  - Recommended Flonase  and Astepro . - Started amoxicillin  if no  improvement. HTN: Continue amlodipine , HCTZ, monitor ambulatory BPs.  BMP. Hypothyroidism Managed with Synthroid .  Check TSH Hyperlipidemia Based on last FLP was Rx atorvastatin  but declined to tate.  Recheck FLP at the next opportunity   + Hemoccult: Positive hemoccult test indicating potential colon cancer. I again reiterated the need to see GI and have a colonoscopy at this is a positive screening test for colon cancer.  Declined referral to GI and states she will proceed at some point. RTC 4 months       "

## 2025-01-08 NOTE — Patient Instructions (Signed)
 Please read your instructions carefully.   GO TO THE LAB :  Get the blood work    Go to the front desk for the checkout Please make an appointment for a checkup in 4 months    Check the  blood pressure regularly Blood pressure goal:  between 110/65 and  130/80. If it is consistently higher or lower, let me know     ACUTE SINUSITIS:  You have persistent sinus congestion and infection with green phlegm and sinus pain that has not improved with over-the-counter treatments. -Flonase : 2 sprays on each side of the nose daily - Astepro : 2 sprays on each side of the nose twice daily until better -Start taking amoxicillin  if there is no improvement.  HYPERTENSION: Your blood pressure is well-controlled with your current medication regimen and has improved after stopping cold and flu medications. -Continue taking amlodipine  and hydrochlorothiazide .  HYPOTHYROIDISM: Your hypothyroidism is managed with Synthroid . -Continue taking Synthroid .  HYPERLIPIDEMIA: You have declined atorvastatin  for now. We will recheck your lipid levels at the next opportunity.  Watch your diet closely   HISTORY OF POSITIVE HEMOCCULT: You have a history of a positive hemoccult test, which indicates a potential risk for colon cancer. -Please follow up with gastroenterology as recommended.

## 2025-01-09 ENCOUNTER — Ambulatory Visit: Payer: Self-pay | Admitting: Internal Medicine

## 2025-01-09 LAB — URINE CULTURE
MICRO NUMBER:: 17496812
Result:: NO GROWTH
SPECIMEN QUALITY:: ADEQUATE

## 2025-01-09 NOTE — Assessment & Plan Note (Signed)
 UTI: Few months ago had leukocyturia and bacteriuria, urine culture negative, was treated with antibiotics. Renal ultrasound showed right hydronephrosis, follow-up CT 07/24/2024: Mildly prominent right renal extra renal pelvis without hydronephrosis. Currently with no symptoms, for completeness we will get a UA urine culture. Cholelithiasis: Complaining of stomach problems, HIDA scan August 2025 consistent with biliary dyskinesia, referred to surgery, s/p cholecystectomy 10/2024. Acute sinusitis Respiratory symptoms for a month, currently with sinus pain, congestion and some nasal discharge. Plan: - Amoxicillin  - Recommended Flonase  and Astepro . - Started amoxicillin  if no improvement. HTN: Continue amlodipine , HCTZ, monitor ambulatory BPs.  BMP. Hypothyroidism Managed with Synthroid .  Check TSH Hyperlipidemia Based on last FLP was Rx atorvastatin  but declined to tate.  Recheck FLP at the next opportunity   + Hemoccult: Positive hemoccult test indicating potential colon cancer. I again reiterated the need to see GI and have a colonoscopy at this is a positive screening test for colon cancer.  Declined referral to GI and states she will proceed at some point. RTC 4 months

## 2025-01-10 LAB — DM TEMPLATE

## 2025-01-11 LAB — DRUG MONITORING PANEL 375977 , URINE
Alcohol Metabolites: NEGATIVE ng/mL
Alphahydroxyalprazolam: 42 ng/mL — ABNORMAL HIGH
Alphahydroxymidazolam: NEGATIVE ng/mL
Alphahydroxytriazolam: NEGATIVE ng/mL
Aminoclonazepam: NEGATIVE ng/mL
Amphetamines: NEGATIVE ng/mL
Barbiturates: NEGATIVE ng/mL
Benzodiazepines: POSITIVE ng/mL — AB
Cocaine Metabolite: NEGATIVE ng/mL
Codeine: NEGATIVE ng/mL
Desmethyltramadol: NEGATIVE ng/mL
Hydrocodone: 58 ng/mL — ABNORMAL HIGH
Hydromorphone: NEGATIVE ng/mL
Hydroxyethylflurazepam: NEGATIVE ng/mL
Lorazepam: NEGATIVE ng/mL
Marijuana Metabolite: NEGATIVE ng/mL
Morphine: NEGATIVE ng/mL
Nordiazepam: NEGATIVE ng/mL
Norhydrocodone: 214 ng/mL — ABNORMAL HIGH
Opiates: POSITIVE ng/mL — AB
Oxazepam: NEGATIVE ng/mL
Oxycodone: NEGATIVE ng/mL
Temazepam: NEGATIVE ng/mL
Tramadol: NEGATIVE ng/mL

## 2025-01-11 LAB — DM TEMPLATE

## 2025-02-11 ENCOUNTER — Ambulatory Visit: Admitting: Internal Medicine
